# Patient Record
Sex: Female | Born: 1960
Health system: Southern US, Community
[De-identification: ages and names within clinical notes are randomized; demographics above are authoritative.]

## PROBLEM LIST (undated history)

## (undated) DIAGNOSIS — K449 Diaphragmatic hernia without obstruction or gangrene: Secondary | ICD-10-CM

## (undated) DIAGNOSIS — C439 Malignant melanoma of skin, unspecified: Secondary | ICD-10-CM

## (undated) DIAGNOSIS — J45909 Unspecified asthma, uncomplicated: Secondary | ICD-10-CM

## (undated) DIAGNOSIS — M797 Fibromyalgia: Secondary | ICD-10-CM

## (undated) DIAGNOSIS — F32A Depression, unspecified: Secondary | ICD-10-CM

## (undated) DIAGNOSIS — K649 Unspecified hemorrhoids: Secondary | ICD-10-CM

## (undated) DIAGNOSIS — F329 Major depressive disorder, single episode, unspecified: Secondary | ICD-10-CM

## (undated) DIAGNOSIS — K219 Gastro-esophageal reflux disease without esophagitis: Secondary | ICD-10-CM

## (undated) DIAGNOSIS — E785 Hyperlipidemia, unspecified: Secondary | ICD-10-CM

## (undated) DIAGNOSIS — K5090 Crohn's disease, unspecified, without complications: Secondary | ICD-10-CM

## (undated) DIAGNOSIS — R918 Other nonspecific abnormal finding of lung field: Secondary | ICD-10-CM

## (undated) HISTORY — DX: Gastro-esophageal reflux disease without esophagitis: K21.9

## (undated) HISTORY — DX: Diaphragmatic hernia without obstruction or gangrene: K44.9

## (undated) HISTORY — PX: MELANOMA EXCISION: SHX5266

## (undated) HISTORY — DX: Unspecified hemorrhoids: K64.9

## (undated) HISTORY — DX: Depression, unspecified: F32.A

## (undated) HISTORY — DX: Malignant melanoma of skin, unspecified: C43.9

## (undated) HISTORY — PX: OTHER SURGICAL HISTORY: SHX169

## (undated) HISTORY — DX: Hyperlipidemia, unspecified: E78.5

## (undated) HISTORY — DX: Other nonspecific abnormal finding of lung field: R91.8

## (undated) HISTORY — DX: Major depressive disorder, single episode, unspecified: F32.9

## (undated) HISTORY — DX: Fibromyalgia: M79.7

## (undated) HISTORY — DX: Crohn's disease, unspecified, without complications: K50.90

---

## 1998-05-25 ENCOUNTER — Ambulatory Visit (HOSPITAL_COMMUNITY): Admission: RE | Admit: 1998-05-25 | Discharge: 1998-05-25 | Payer: Self-pay | Admitting: Obstetrics and Gynecology

## 2000-05-17 ENCOUNTER — Ambulatory Visit: Admission: RE | Admit: 2000-05-17 | Discharge: 2000-05-17 | Payer: Self-pay | Admitting: Obstetrics and Gynecology

## 2000-05-21 ENCOUNTER — Inpatient Hospital Stay (HOSPITAL_COMMUNITY): Admission: AD | Admit: 2000-05-21 | Discharge: 2000-05-23 | Payer: Self-pay | Admitting: Obstetrics and Gynecology

## 2000-05-30 ENCOUNTER — Inpatient Hospital Stay (HOSPITAL_COMMUNITY): Admission: AD | Admit: 2000-05-30 | Discharge: 2000-05-30 | Payer: Self-pay | Admitting: Obstetrics and Gynecology

## 2000-07-05 ENCOUNTER — Other Ambulatory Visit: Admission: RE | Admit: 2000-07-05 | Discharge: 2000-07-05 | Payer: Self-pay | Admitting: Obstetrics and Gynecology

## 2001-01-16 ENCOUNTER — Encounter (INDEPENDENT_AMBULATORY_CARE_PROVIDER_SITE_OTHER): Payer: Self-pay | Admitting: Specialist

## 2001-01-16 ENCOUNTER — Ambulatory Visit (HOSPITAL_COMMUNITY): Admission: RE | Admit: 2001-01-16 | Discharge: 2001-01-16 | Payer: Self-pay | Admitting: Gastroenterology

## 2001-07-24 ENCOUNTER — Other Ambulatory Visit: Admission: RE | Admit: 2001-07-24 | Discharge: 2001-07-24 | Payer: Self-pay | Admitting: Obstetrics and Gynecology

## 2001-09-15 ENCOUNTER — Encounter: Payer: Self-pay | Admitting: Family Medicine

## 2001-09-15 ENCOUNTER — Encounter: Admission: RE | Admit: 2001-09-15 | Discharge: 2001-09-15 | Payer: Self-pay | Admitting: Family Medicine

## 2002-08-29 ENCOUNTER — Other Ambulatory Visit: Admission: RE | Admit: 2002-08-29 | Discharge: 2002-08-29 | Payer: Self-pay | Admitting: Obstetrics and Gynecology

## 2003-02-28 ENCOUNTER — Ambulatory Visit (HOSPITAL_COMMUNITY): Admission: RE | Admit: 2003-02-28 | Discharge: 2003-02-28 | Payer: Self-pay | Admitting: Gastroenterology

## 2003-02-28 ENCOUNTER — Encounter (INDEPENDENT_AMBULATORY_CARE_PROVIDER_SITE_OTHER): Payer: Self-pay | Admitting: *Deleted

## 2003-09-19 ENCOUNTER — Other Ambulatory Visit: Admission: RE | Admit: 2003-09-19 | Discharge: 2003-09-19 | Payer: Self-pay | Admitting: Obstetrics and Gynecology

## 2004-04-29 ENCOUNTER — Encounter: Admission: RE | Admit: 2004-04-29 | Discharge: 2004-04-29 | Payer: Self-pay | Admitting: Gastroenterology

## 2004-10-30 ENCOUNTER — Other Ambulatory Visit: Admission: RE | Admit: 2004-10-30 | Discharge: 2004-10-30 | Payer: Self-pay | Admitting: Obstetrics and Gynecology

## 2005-08-17 ENCOUNTER — Encounter: Admission: RE | Admit: 2005-08-17 | Discharge: 2005-08-17 | Payer: Self-pay | Admitting: Gastroenterology

## 2005-11-18 ENCOUNTER — Other Ambulatory Visit: Admission: RE | Admit: 2005-11-18 | Discharge: 2005-11-18 | Payer: Self-pay | Admitting: Obstetrics and Gynecology

## 2006-04-18 ENCOUNTER — Encounter: Admission: RE | Admit: 2006-04-18 | Discharge: 2006-04-18 | Payer: Self-pay | Admitting: Family Medicine

## 2006-05-06 ENCOUNTER — Encounter: Admission: RE | Admit: 2006-05-06 | Discharge: 2006-05-06 | Payer: Self-pay | Admitting: Neurology

## 2007-01-30 ENCOUNTER — Encounter: Admission: RE | Admit: 2007-01-30 | Discharge: 2007-01-30 | Payer: Self-pay | Admitting: Gastroenterology

## 2010-10-26 ENCOUNTER — Other Ambulatory Visit: Payer: Self-pay | Admitting: Gastroenterology

## 2011-01-29 NOTE — Op Note (Signed)
[name], Jessica Bates                          ACCOUNT NO.:  [id number]   MEDICAL RECORD NO.:  [id number]                   PATIENT TYPE:  AMB   LOCATION:  ENDO                                 FACILITY:  [facility]   PHYSICIAN:  Jeryl Columbia, M.D.                 DATE OF BIRTH:  [birth year]   DATE OF PROCEDURE:  02/28/2003  DATE OF DISCHARGE:                                 OPERATIVE REPORT   PROCEDURE:  Colonoscopy with biopsy.   INDICATION:  Chron's disease, want to reevaluate.  Consent was signed after  risks, benefits, methods, options thoroughly discussed in the office.   MEDICINES USED:  1. Demerol 70.  2. Versed 7.   DESCRIPTION OF PROCEDURE:  Rectal inspection was pertinent for external  hemorrhoids, small.  Digital exam was negative.  The pediatric video  adjustable colonoscope was inserted and with some difficulty due to a  tortuous colon, was able to be advanced to the level of the ileocecal valve.  On insertion, it looked like she had some stricturing and scarring and  pseudopolyps but no obvious active disease.  Despite rolling her in all  three positions and various abdominal pressures, we could not advance to the  cecal pole, and unfortunately it kept falling back, it could not advance  into the terminal ileum, so we elected to withdraw.  The prep was adequate.  There was minimal liquid stool that required washing and suctioning.  The  scope was slowly withdrawn.  The pseudopolyps and scarring were seen in both  flexures and then with some in the descending and sigmoid, and some  scattered biopsies of some of the pseudopolyps were obtained.  But again, no  inflammatory changes or ulcers or erythema was seen as we slowly withdrew  back to the rectum.  Once back in the rectum, the scope was retroflexed,  pertinent for some internal hemorrhoids.  The scope was straightened and  readvanced a short ways up the left side of the colon; air was suctioned,  scope removed.  The  patient tolerated the procedure well.  There was no  obvious immediate complication.   ENDOSCOPIC DIAGNOSES:  1. Internal-external hemorrhoids.  2. Pseudopolyps in the sigmoid, descending, transverse, and flexures, status     post biopsy.  3. Very tortuous colon.  4. Otherwise within normal limits to the level of the ileocecal valve,     unable to advance to the cecal pole or terminal ileum without any ulcers     or inflammation being seen.    PLAN:  1. Await pathology but consider holding her Remicade and see how she does     clinically.  2. Continuing 5-ASA products.  Jeryl Columbia, M.D.    MEM/MEDQ  D:  02/28/2003  T:  02/28/2003  Job:  [id number]   cc:   Lennette Bihari L. Little, M.D.  [address] [place]  [place] [address]  Fax: [phone]   Michael Litter, M.D.  [address]  [place] [address]  Fax: [phone]

## 2011-01-29 NOTE — Procedures (Signed)
[place]  Patient:    Jessica Bates, Jessica Bates                       MRN: [id number] Proc. Date: 01/16/01 Adm. Date:  [id number] Attending:  Orvis Brill CC:         Denita Lung, M.D.  Roswell Miners, M.D.   Procedure Report  PROCEDURE:  Colonoscopy with biopsy.  INDICATION:  Patient with mild Crohns disease based on her symptoms but some arthritis and arthralgias.  Want to re-evaluate her Crohns disease.  Consent was signed after risks, benefits, methods, and options thoroughly discussed in the office.  MEDICATIONS:  Demerol 50 mg, Versed 5 mg.  DESCRIPTION OF PROCEDURE:  Rectal inspection was pertinent for external hemorrhoids.  Digital exam was negative.  The video pediatric colonoscope was inserted with mild difficulty due to a tortuous colon with some looping.  We were able to advance to the cecum.  This did require rolling her on her back and various abdominal pressures.  On insertion, the rectum and the distal sigmoid seemed to be spared, but mostly in the descending in the transverse were patchy ulcers and inflammation and some pseudopolyps.  The cecum seemed to be spared as well as most of the ascending.  We did try multiple times to advanced into the terminal ileum.  We did peek inside the TI twice but could not maintain position.  No gross obvious abnormalities were seen in the terminal ileum on very quick evaluation.  The cecum was identified by the the ileocecal valve and the appendiceal orifice.  The prep was adequate.  There was some liquid stool throughout the colon, which required washing and suctioning, but on slow withdrawal through the colon as mentioned above, the cecum and the majority of the ascending seemed to be normal.  There were patchy ulcers throughout the transverse and the majority of the descending. Occasional biopsies were obtained.  There were also some pseudopolyps and occasional biopsies of them were  obtained as well.  We withdrew back to 40 cm, approximately in the sigmoid-descending junction.  We went ahead and switched containers, and a few of the distal ulcers and pseudopolyps were biopsied and put in a second container.  The distal sigmoid was normal, as was the rectum. The scope was retroflexed, pertinent for some internal hemorrhoids, small. The scope was straightened, air was suctioned, and scope removed.  The patient tolerated the procedure well.  There was no obvious immediate complication.  ENDOSCOPIC DIAGNOSES: 1. Small internal-external hemorrhoids. 2. Rectal distal sigmoid and cecum and part of the ascending being spared. 3. Patchy multiple ulcers and pseudopolyps with multiple left and right    biopsies, separated from 40 cm, with the second bottle being the more    distal biopsies. 4. Otherwise within normal limits with very quick look to the terminal ileum.  PLAN:  Await pathology and then discuss case with Dr. Justine Null to discuss 6-MP, Imuran, or Remicade or any other medicine options, and have her call me sooner p.r.n. DD:  01/16/01 TD:  01/17/01 Job: [id number]

## 2012-03-06 ENCOUNTER — Other Ambulatory Visit: Payer: Self-pay | Admitting: Obstetrics and Gynecology

## 2012-03-06 DIAGNOSIS — R928 Other abnormal and inconclusive findings on diagnostic imaging of breast: Secondary | ICD-10-CM

## 2012-04-03 ENCOUNTER — Ambulatory Visit
Admission: RE | Admit: 2012-04-03 | Discharge: 2012-04-03 | Disposition: A | Discharge status: Home / Self Care | Payer: BC Managed Care – PPO | Source: Ambulatory Visit | Attending: Obstetrics and Gynecology | Admitting: Obstetrics and Gynecology

## 2012-04-03 ENCOUNTER — Other Ambulatory Visit: Payer: Self-pay

## 2012-04-03 DIAGNOSIS — R928 Other abnormal and inconclusive findings on diagnostic imaging of breast: Secondary | ICD-10-CM

## 2012-09-04 ENCOUNTER — Other Ambulatory Visit: Payer: Self-pay | Admitting: Obstetrics and Gynecology

## 2012-09-04 DIAGNOSIS — N644 Mastodynia: Secondary | ICD-10-CM

## 2012-09-14 ENCOUNTER — Ambulatory Visit
Admission: RE | Admit: 2012-09-14 | Discharge: 2012-09-14 | Disposition: A | Discharge status: Home / Self Care | Payer: BC Managed Care – PPO | Source: Ambulatory Visit | Attending: Obstetrics and Gynecology | Admitting: Obstetrics and Gynecology

## 2012-09-14 DIAGNOSIS — N644 Mastodynia: Secondary | ICD-10-CM

## 2012-09-15 ENCOUNTER — Other Ambulatory Visit: Payer: BC Managed Care – PPO

## 2012-12-22 ENCOUNTER — Other Ambulatory Visit: Payer: Self-pay | Admitting: Gastroenterology

## 2012-12-22 DIAGNOSIS — R109 Unspecified abdominal pain: Secondary | ICD-10-CM

## 2012-12-25 ENCOUNTER — Ambulatory Visit
Admission: RE | Admit: 2012-12-25 | Discharge: 2012-12-25 | Disposition: A | Discharge status: Home / Self Care | Payer: BC Managed Care – PPO | Source: Ambulatory Visit | Attending: Gastroenterology | Admitting: Gastroenterology

## 2012-12-25 DIAGNOSIS — R109 Unspecified abdominal pain: Secondary | ICD-10-CM

## 2012-12-25 MED ORDER — IOHEXOL 300 MG/ML  SOLN
100.0000 mL | Freq: Once | INTRAMUSCULAR | Status: AC | PRN
  Administered 2012-12-25: 100 mL via INTRAVENOUS

## 2013-05-22 ENCOUNTER — Other Ambulatory Visit: Payer: Self-pay | Admitting: Obstetrics and Gynecology

## 2013-05-22 DIAGNOSIS — R928 Other abnormal and inconclusive findings on diagnostic imaging of breast: Secondary | ICD-10-CM

## 2013-06-07 ENCOUNTER — Ambulatory Visit
Admission: RE | Admit: 2013-06-07 | Discharge: 2013-06-07 | Disposition: A | Discharge status: Home / Self Care | Payer: BC Managed Care – PPO | Source: Ambulatory Visit | Attending: Obstetrics and Gynecology | Admitting: Obstetrics and Gynecology

## 2013-06-07 DIAGNOSIS — R928 Other abnormal and inconclusive findings on diagnostic imaging of breast: Secondary | ICD-10-CM

## 2014-03-11 ENCOUNTER — Other Ambulatory Visit: Payer: Self-pay | Admitting: Gastroenterology

## 2014-03-11 DIAGNOSIS — R109 Unspecified abdominal pain: Secondary | ICD-10-CM

## 2014-03-14 ENCOUNTER — Ambulatory Visit
Admission: RE | Admit: 2014-03-14 | Discharge: 2014-03-14 | Disposition: A | Discharge status: Home / Self Care | Payer: BC Managed Care – PPO | Source: Ambulatory Visit | Attending: Gastroenterology | Admitting: Gastroenterology

## 2014-03-14 DIAGNOSIS — R109 Unspecified abdominal pain: Secondary | ICD-10-CM

## 2014-03-14 MED ORDER — IOHEXOL 300 MG/ML  SOLN
100.0000 mL | Freq: Once | INTRAMUSCULAR | Status: AC | PRN
  Administered 2014-03-14: 100 mL via INTRAVENOUS

## 2014-08-12 ENCOUNTER — Other Ambulatory Visit: Payer: Self-pay | Admitting: Gastroenterology

## 2015-05-22 ENCOUNTER — Other Ambulatory Visit: Payer: Self-pay | Admitting: Obstetrics and Gynecology

## 2015-05-23 LAB — CYTOLOGY - PAP

## 2016-05-31 ENCOUNTER — Encounter: Payer: Self-pay | Admitting: Internal Medicine

## 2016-05-31 ENCOUNTER — Ambulatory Visit (INDEPENDENT_AMBULATORY_CARE_PROVIDER_SITE_OTHER): Payer: BLUE CROSS/BLUE SHIELD | Admitting: Internal Medicine

## 2016-05-31 DIAGNOSIS — R05 Cough: Secondary | ICD-10-CM | POA: Diagnosis not present

## 2016-05-31 DIAGNOSIS — R053 Chronic cough: Secondary | ICD-10-CM | POA: Insufficient documentation

## 2016-05-31 LAB — NITRIC OXIDE: Nitric Oxide: 16

## 2016-05-31 NOTE — Patient Instructions (Addendum)
It is nice to meet you today. We will do a FENO today in the office. We will give you a copy of the GERD diet. Continue your protonix treatment as Dr. Watt Climes prescribed. Sips of water instead of throat clearing. Sugar Free Jolly Ranchers for throat soothing. Follow Up with Dr. Chase Caller or NP in 6 -8 weeks. Please contact office for sooner follow up if symptoms do not improve or worsen or seek emergency care

## 2016-05-31 NOTE — Assessment & Plan Note (Addendum)
Chronic Cough Plan: We will do a FENO today in the office. We will give you a copy of the GERD diet. Continue your protonix treatment as Dr. Watt Climes prescribed. Sips of water instead of throat clearing. Sugar Free Jolly Ranchers for throat soothing. Follow Up with Dr. Chase Caller or NP in 6 -8 weeks. Please contact office for sooner follow up if symptoms do not improve or worsen or seek emergency care

## 2016-05-31 NOTE — Progress Notes (Signed)
Subjective:    Patient ID: Jessica Bates, female    DOB: 09-17-1960, 55 y.o.   MRN: [id number]   [place] Lehrmann is a 55 year old female former smoker referred by Dr. Hulan Fess for chronic cough here to see Dr. Brand Males.  HPI   Pt. Presents to the office as a referral from Dr. Hulan Fess for chronic cough/ asthma breathing issues. She states that her cough started 01/2016, and that it is non-productive. It started as a cough while lying down,or throat clearing when standing or sitting. She has also had some post nasal drip, but no sinus congestion. She is taking Zyrtec daily. She did go and see Dr. Watt Climes, and was started on the Prilosec and she is having an endoscopy done 06/21/2016.She has no wheezing or history of asthma.She also states she has some shortness of breath with activity, specifically climbing stairs. She also states it is hard to get a deep breath. She takes Humira for Chrohn's disease. She has been taking this medication for 18 months.She  states she  does have some pain in her right scapular area  what is worse on inspiration and worse after a large meal. She denies fever, chest pain, orthopnea or hemoptysis. 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    Current Outpatient Prescriptions:  .  Adalimumab (HUMIRA) 40 MG/0.8ML PSKT, Inject into the skin. Every two weeks, Disp: , Rfl:  .  atorvastatin (LIPITOR) 40 MG tablet, Take 40 mg by mouth daily., Disp: , Rfl:  .  cetirizine (ZYRTEC) 10 MG tablet, Take 10 mg by mouth daily., Disp: , Rfl:  .  norgestrel-ethinyl estradiol (LO/OVRAL,CRYSELLE) 0.3-30 MG-MCG tablet, Take 1 tablet by mouth daily., Disp: , Rfl:  .  omeprazole (PRILOSEC) 40 MG capsule, Take 40 mg by mouth 2 (two) times daily., Disp: , Rfl:  .  zolpidem (AMBIEN) 10 MG tablet, Take 10 mg by mouth at bedtime as needed for sleep., Disp: , Rfl:    Past Surgical History:  Procedure Laterality Date  . MELANOMA  EXCISION      Past Medical History:  Diagnosis Date  . Crohn disease ([place])   . Depression meopause  . Dyslipidemia   . Fibromyalgia   . Hemorrhoids   . Melanoma [facility])    Social History   Social History  . Marital status: Single    Spouse name: N/A  . Number of children: N/A  . Years of education: N/A   Social History Main Topics  . Smoking status: Former Smoker    Packs/day: 0.25    Years: 15.00    Types: Cigarettes    Quit date: 09/13/1998  . Smokeless tobacco: Never Used  . Alcohol use Yes     Comment: occassional  . Drug use: No  . Sexual activity: Not Asked   Other Topics Concern  . None   Social History Narrative   Assistant [employer]        Family History  Problem Relation Age of Onset  . Leukemia Father    Review of Systems  Constitutional: Negative for fever and unexpected weight change.  HENT: Negative for congestion, dental problem, ear pain, nosebleeds, postnasal drip, rhinorrhea, sinus pressure, sneezing, sore throat and trouble swallowing.   Eyes: Negative for redness and itching.  Respiratory: Positive for cough. Negative for chest tightness, shortness of breath and wheezing.   Cardiovascular: Negative for palpitations and leg swelling.  Gastrointestinal: Negative for nausea and vomiting.  Genitourinary: Negative for dysuria.  Musculoskeletal: Negative for joint swelling.  Skin: Negative for rash.  Neurological: Negative for headaches.  Hematological: Does not bruise/bleed easily.  Psychiatric/Behavioral: Negative for dysphoric mood. The patient is not nervous/anxious.    Tests:  FENO 05/31/2016>> 16 CXR 05/03/2016>> No Active cardio-pulmonary process.    Objective:   Physical Exam  General- No distress,  A&Ox3, pleasant ENT: No sinus tenderness, TM clear, pale nasal mucosa, no oral exudate,no post nasal drip, no LAN Cardiac: S1, S2, regular rate and rhythm, no murmur Chest: No wheeze/ rales/ dullness; no accessory  muscle use, no nasal flaring, no sternal retractions Abd.: Soft Non-tender Ext: No clubbing cyanosis, edema Neuro:  normal strength Skin: No rashes, warm and dry Psych: normal mood and behavior  BP 120/68 (BP Location: Left Arm, Cuff Size: Normal)   Pulse 69   Ht 5' 6"  (1.676 m)   Wt 132 lb 6.4 oz (60.1 kg)   SpO2 99%   BMI 21.37 kg/m           Assessment & Plan:  Chronic cough improving with PPI treatment per GI CXR clear ( Immunosuppressed on Humira) Very Low suspicion for opportunistic infection  Plan: We will do a FENO today in the office. We will give you a copy of the GERD diet. Continue your protonix treatment as Dr. Watt Climes prescribed. Sips of water instead of throat clearing. Sugar Free Jolly Ranchers for throat soothing. Follow Up with Dr. Chase Caller or NP in 6 -8 weeks. Please contact office for sooner follow up if symptoms do not improve or worsen or seek emergency care     STAFF NOTE: I, Dr Ann Lions have personally reviewed patient's available data, including medical history, events of note, physical examination and test results as part of my evaluation. I have discussed with resident/NP and other care providers such as pharmacist, RN and RRT.  In addition,  I personally evaluated patient and elicited key findings of   S: nerw consult for chronic cough since may 2017. No preceding infectious illness. Lot of throat clearing. Cough improved significantly since going on PPI. Alsp has exertional dyspnea and feeling to take deep breath since summer 2017. Has EGD pending. Feno test normal. CXR with PCP aug 2017 clear. No fever . No sputum  Pmhx: on TNF-alpha blockade  O: Looks well Occ clearing of throat No crack;es on resp exam CTA bilaterally    A: chronic cough - history fits in with gerd mediated irritable larynx. A lot better Dyspnea - ? Cause but improving  No evidence of asthma  At risk for opportunistic infection and with symptioms but risk low  given improvement and clear cxr  P: walk test on RA - she prefers to avoid it today and followup She prefers to keep an eye on cough; and return in 4-8 weeks if still problematic + dependent on EGD results - this is very acceptable plan    .  Rest per NP/medical resident whose note is outlined above and that I agree with     Dr. Brand Males, M.D., [facility].C.P Pulmonary and Critical Care Medicine Staff Physician [place] [facility] Pager: [phone], If no answer or between  15:00h - 7:00h: call [phone]  05/31/2016 4:10 PM

## 2016-07-13 ENCOUNTER — Ambulatory Visit: Payer: BLUE CROSS/BLUE SHIELD | Admitting: Internal Medicine

## 2016-11-07 ENCOUNTER — Emergency Department (HOSPITAL_COMMUNITY): Payer: BLUE CROSS/BLUE SHIELD

## 2016-11-07 ENCOUNTER — Emergency Department (HOSPITAL_COMMUNITY)
Admission: EM | Admit: 2016-11-07 | Discharge: 2016-11-07 | Disposition: A | Discharge status: Home / Self Care | Payer: BLUE CROSS/BLUE SHIELD | Attending: Emergency Medicine | Admitting: Emergency Medicine

## 2016-11-07 ENCOUNTER — Encounter (HOSPITAL_COMMUNITY): Payer: Self-pay | Admitting: Emergency Medicine

## 2016-11-07 DIAGNOSIS — Z79899 Other long term (current) drug therapy: Secondary | ICD-10-CM | POA: Diagnosis not present

## 2016-11-07 DIAGNOSIS — R079 Chest pain, unspecified: Secondary | ICD-10-CM | POA: Diagnosis present

## 2016-11-07 DIAGNOSIS — Z87891 Personal history of nicotine dependence: Secondary | ICD-10-CM | POA: Insufficient documentation

## 2016-11-07 DIAGNOSIS — Z85820 Personal history of malignant melanoma of skin: Secondary | ICD-10-CM | POA: Diagnosis not present

## 2016-11-07 DIAGNOSIS — J189 Pneumonia, unspecified organism: Secondary | ICD-10-CM | POA: Insufficient documentation

## 2016-11-07 HISTORY — DX: Unspecified asthma, uncomplicated: J45.909

## 2016-11-07 LAB — I-STAT CG4 LACTIC ACID, ED: Lactic Acid, Venous: 0.59 mmol/L (ref 0.5–1.9)

## 2016-11-07 LAB — BASIC METABOLIC PANEL
Anion gap: 10 (ref 5–15)
BUN: 7 mg/dL (ref 6–20)
CO2: 22 mmol/L (ref 22–32)
Calcium: 9 mg/dL (ref 8.9–10.3)
Chloride: 105 mmol/L (ref 101–111)
Creatinine, Ser: 0.7 mg/dL (ref 0.44–1.00)
GFR calc Af Amer: >60 mL/min (ref >60)
GFR calc non Af Amer: >60 mL/min (ref >60)
Glucose, Bld: 100 mg/dL — ABNORMAL HIGH (ref 65–99)
Potassium: 3.7 mmol/L (ref 3.5–5.1)
Sodium: 137 mmol/L (ref 135–145)

## 2016-11-07 LAB — CBC WITH DIFFERENTIAL/PLATELET
Basophils Absolute: 0 10*3/uL (ref 0.0–0.1)
Basophils Relative: 0 %
Eosinophils Absolute: 0 10*3/uL (ref 0.0–0.7)
Eosinophils Relative: 0 %
HCT: 36.1 % (ref 36.0–46.0)
Hemoglobin: 11.7 g/dL — ABNORMAL LOW (ref 12.0–15.0)
Lymphocytes Relative: 19 %
Lymphs Abs: 2.5 10*3/uL (ref 0.7–4.0)
MCH: 30.6 pg (ref 26.0–34.0)
MCHC: 32.4 g/dL (ref 30.0–36.0)
MCV: 94.5 fL (ref 78.0–100.0)
Monocytes Absolute: 0.9 10*3/uL (ref 0.1–1.0)
Monocytes Relative: 7 %
Neutro Abs: 9.7 10*3/uL — ABNORMAL HIGH (ref 1.7–7.7)
Neutrophils Relative %: 74 %
Platelets: 425 10*3/uL — ABNORMAL HIGH (ref 150–400)
RBC: 3.82 MIL/uL — ABNORMAL LOW (ref 3.87–5.11)
RDW: 12.2 % (ref 11.5–15.5)
WBC: 13.3 10*3/uL — ABNORMAL HIGH (ref 4.0–10.5)

## 2016-11-07 MED ORDER — IOPAMIDOL (ISOVUE-370) INJECTION 76%
INTRAVENOUS | Status: AC
  Administered 2016-11-07: 100 mL
  Filled 2016-11-07: qty 100

## 2016-11-07 MED ORDER — CEFTRIAXONE SODIUM 1 G IJ SOLR
1.0000 g | Freq: Once | INTRAMUSCULAR | Status: AC
  Administered 2016-11-07: 1 g via INTRAVENOUS
  Filled 2016-11-07: qty 10

## 2016-11-07 MED ORDER — SODIUM CHLORIDE 0.9 % IV BOLUS (SEPSIS)
500.0000 mL | Freq: Once | INTRAVENOUS | Status: AC
  Administered 2016-11-07: 500 mL via INTRAVENOUS

## 2016-11-07 MED ORDER — AZITHROMYCIN 250 MG PO TABS
500.0000 mg | ORAL_TABLET | Freq: Once | ORAL | Status: AC
  Administered 2016-11-07: 500 mg via ORAL
  Filled 2016-11-07: qty 2

## 2016-11-07 MED ORDER — CIPROFLOXACIN HCL 500 MG PO TABS: 500.0000 mg | ORAL_TABLET | Freq: Two times a day (BID) | ORAL | 0 refills | Status: DC

## 2016-11-07 MED ORDER — BENZONATATE 100 MG PO CAPS: 100.0000 mg | ORAL_CAPSULE | Freq: Three times a day (TID) | ORAL | 0 refills | Status: DC

## 2016-11-07 NOTE — Discharge Instructions (Signed)
If you were given medicines take as directed.  If you are on coumadin or contraceptives realize their levels and effectiveness is altered by many different medicines.  If you have any reaction (rash, tongues swelling, other) to the medicines stop taking and see a physician.    If your blood pressure was elevated in the ER make sure you follow up for management with a primary doctor or return for chest pain, shortness of breath or stroke symptoms.  Please follow up as directed and return to the ER or see a physician for new or worsening symptoms.  Thank you. Vitals:   11/07/16 1151 11/07/16 1430 11/07/16 1500 11/07/16 1530  BP: 111/68 129/77 131/76 133/74  Pulse: 77 77 88 74  Resp: 17     Temp: 98.7 F (37.1 C)     TempSrc: Oral     SpO2: 100% 100% 92% 100%  Weight: 129 lb (58.5 kg)     Height: 5' 6"  (1.676 m)

## 2016-11-07 NOTE — ED Triage Notes (Signed)
Pt. Stated, I had flu like symptoms all last week with left shoulder pain and Ive had rt. Rib pain under my arm.  I have just not felt good. I went to doctor this morning , and  Said I have pneumonia on both sides, and said I need to come here.

## 2016-11-07 NOTE — ED Notes (Signed)
Patient requesting prescription for cough-related discomfort.

## 2016-11-07 NOTE — ED Notes (Addendum)
Patient states she was referred to ED by [facility] physicians after a CXR this morning showing BIL pneumonia.  Patient ambulatory to restroom independently without difficulty.

## 2016-11-07 NOTE — ED Provider Notes (Signed)
Patient's care signed out to follow-up CT scan send home with oral abx with outpatient follow up.   Ct Angio Chest Pe W Or Wo Contrast  Result Date: 11/07/2016 CLINICAL DATA:  Cough and chest pain since last night. Pneumonia based on chest films earlier today. EXAM: CT ANGIOGRAPHY CHEST WITH CONTRAST TECHNIQUE: Multidetector CT imaging of the chest was performed using the standard protocol during bolus administration of intravenous contrast. Multiplanar CT image reconstructions and MIPs were obtained to evaluate the vascular anatomy. CONTRAST:  80 cc Isovue 370. COMPARISON:  None. FINDINGS: Cardiovascular: No pulmonary embolus is identified. No pericardial effusion. Heart size is mildly enlarged. Mediastinum/Nodes: Right hilar lymph node on image 54 measures 1.3 cm in diameter and is likely reactive. A few additional small mediastinal lymph nodes are noted. No axillary adenopathy. Thyroid gland appears normal. No hiatal hernia. Lungs/Pleura: Small pleural effusion is identified. The patient has airspace disease in both the right middle lobe and lingula with an appearance most consistent with pneumonia. Mild patchy airspace opacity is also seen in the superior segments of the lower lobes bilaterally, worse on the right. Upper Abdomen: No acute abnormality. Musculoskeletal: Bilateral breast implants are noted. No focal bony abnormality. Review of the MIP images confirms the above findings. IMPRESSION: Negative for pulmonary embolus. Multifocal bilateral airspace disease as described above and small pleural effusions consistent with pneumonia. Airspace disease is worst in the lingula and right middle lobe. Electronically Signed   By: Inge Rise M.D.   On: 11/07/2016 16:28  Ravindra Baranek, Paula Compton, MD 11/07/16 [phone]

## 2016-11-07 NOTE — ED Provider Notes (Signed)
[place] DEPT Provider Note   CSN: [id number] Arrival date & time: 11/07/16  1141     History   Chief Complaint Chief Complaint  Patient presents with  . Shoulder Pain  . Chest Pain    right rib cage.    HPI Jessica Bates is a 56 y.o. female.  Patient with history of Crohn's disease on Humira, estrogen use -- presents with complaint of flulike symptoms for the past week. Over the past several days patient developed pain in her left shoulder and right lateral ribs. She has had persistent cough. She has had intermittent fevers and chills, night sweats. Patient went to [facility] primary care today and had a chest x-ray showing bilateral pneumonia. She was sent to the emergency department for further evaluation. No hemoptysis. No vomiting or diarrhea. No lightheadedness or syncope. No lower extremity swelling or history of heart failure. The onset of this condition was acute. The course is constant. Aggravating factors: none. Alleviating factors: none.        Past Medical History:  Diagnosis Date  . Crohn disease ([place])   . Depression meopause  . Dyslipidemia   . Fibromyalgia   . Hemorrhoids   . Melanoma [facility])     Patient Active Problem List   Diagnosis Date Noted  . Chronic cough 05/31/2016    Past Surgical History:  Procedure Laterality Date  . MELANOMA EXCISION      OB History    No data available       Home Medications    Prior to Admission medications   Medication Sig Start Date End Date Taking? Authorizing Provider  atorvastatin (LIPITOR) 40 MG tablet Take 40 mg by mouth daily.   Yes Historical Provider, MD  cetirizine (ZYRTEC) 10 MG tablet Take 10 mg by mouth daily.   Yes Historical Provider, MD  estradiol (VIVELLE-DOT) 0.075 MG/24HR Place 1 patch onto the skin See admin instructions. Sunday Tuesday 10/22/16  Yes Historical Provider, MD  norgestrel-ethinyl estradiol (LO/OVRAL,CRYSELLE) 0.3-30 MG-MCG tablet Take 1 tablet by mouth daily.   Yes Historical  Provider, MD  omeprazole (PRILOSEC) 40 MG capsule Take 40 mg by mouth 2 (two) times daily.   Yes Historical Provider, MD  zolpidem (AMBIEN) 10 MG tablet Take 10 mg by mouth at bedtime as needed for sleep.   Yes Historical Provider, MD  Adalimumab (HUMIRA) 40 MG/0.8ML PSKT Inject into the skin. Every two weeks    Historical Provider, MD    Family History Family History  Problem Relation Age of Onset  . Leukemia Father     Social History Social History  Substance Use Topics  . Smoking status: Former Smoker    Packs/day: 0.25    Years: 15.00    Types: Cigarettes    Quit date: 09/13/1998  . Smokeless tobacco: Never Used  . Alcohol use Yes     Comment: occassional     Allergies   Cymbalta [duloxetine hcl]; Penicillins; and Remicade [infliximab]   Review of Systems Review of Systems  Constitutional: Positive for chills, fatigue and fever.  HENT: Negative for rhinorrhea and sore throat.   Eyes: Negative for redness.  Respiratory: Negative for cough and wheezing.   Cardiovascular: Positive for chest pain.  Gastrointestinal: Negative for abdominal pain, diarrhea, nausea and vomiting.  Genitourinary: Negative for dysuria.  Musculoskeletal: Negative for myalgias.  Skin: Negative for rash.  Neurological: Negative for headaches.     Physical Exam Updated Vital Signs BP 111/68 (BP Location: Left Arm)   Pulse 77  Temp 98.7 F (37.1 C) (Oral)   Resp 17   Ht 5' 6"  (1.676 m)   Wt 58.5 kg   LMP 02/14/2014 Comment: bcp//A.C.  SpO2 100%   BMI 20.82 kg/m   Physical Exam  Constitutional: She appears well-developed and well-nourished.  HENT:  Head: Normocephalic and atraumatic.  Right Ear: Tympanic membrane, external ear and ear canal normal.  Left Ear: Tympanic membrane, external ear and ear canal normal.  Nose: Nose normal. No mucosal edema or rhinorrhea.  Mouth/Throat: Uvula is midline, oropharynx is clear and moist and mucous membranes are normal. Mucous membranes are not  dry. No oral lesions. No trismus in the jaw. No uvula swelling. No oropharyngeal exudate, posterior oropharyngeal edema, posterior oropharyngeal erythema or tonsillar abscesses.  Eyes: Conjunctivae are normal. Right eye exhibits no discharge. Left eye exhibits no discharge.  Neck: Normal range of motion. Neck supple.  Cardiovascular: Normal rate, regular rhythm and normal heart sounds.   No murmur heard. Pulmonary/Chest: Effort normal. No respiratory distress. She has no wheezes. She has rales (Bilateral bases). She exhibits tenderness.  Abdominal: Soft. She exhibits no mass. There is no tenderness. There is no guarding.  Lymphadenopathy:    She has no cervical adenopathy.  Neurological: She is alert.  Skin: Skin is warm and dry.  Psychiatric: She has a normal mood and affect.  Nursing note and vitals reviewed.    ED Treatments / Results  Labs (all labs ordered are listed, but only abnormal results are displayed) Labs Reviewed  BASIC METABOLIC PANEL - Abnormal; Notable for the following:       Result Value   Glucose, Bld 100 (*)    All other components within normal limits  CBC WITH DIFFERENTIAL/PLATELET - Abnormal; Notable for the following:    WBC 13.3 (*)    RBC 3.82 (*)    Hemoglobin 11.7 (*)    Platelets 425 (*)    Neutro Abs 9.7 (*)    All other components within normal limits  I-STAT CG4 LACTIC ACID, ED    EKG  EKG Interpretation None       Radiology No results found.  Procedures Procedures (including critical care time)  Medications Ordered in ED Medications  cefTRIAXone (ROCEPHIN) 1 g in dextrose 5 % 50 mL IVPB (not administered)  azithromycin (ZITHROMAX) tablet 500 mg (not administered)  sodium chloride 0.9 % bolus 500 mL (not administered)     Initial Impression / Assessment and Plan / ED Course  I have reviewed the triage vital signs and the nursing notes.  Pertinent labs & imaging results that were available during my care of the patient were  reviewed by me and considered in my medical decision making (see chart for details).     Patient seen and examined. Work-up initiated. Medications ordered.   Vital signs reviewed and are as follows: BP 111/68 (BP Location: Left Arm)   Pulse 77   Temp 98.7 F (37.1 C) (Oral)   Resp 17   Ht 5' 6"  (1.676 m)   Wt 58.5 kg   LMP 02/14/2014 Comment: bcp//A.C.  SpO2 100%   BMI 20.82 kg/m   Patient noted to have elevated white blood cell count. I am unable to see her chest x-ray. I feel that given she is on immunosuppressive therapy and estrogen, chest CT to evaluate extent of pneumonia and to rule out pulmonary embolism is reasonable. Will also check lactate. Patient and family in agreement with plan.   4:22 PM Handoff to Dr. Reather Converse  at shift change, pending CT results. Lactate normal.   Final Clinical Impressions(s) / ED Diagnoses   Final diagnoses:  Community acquired pneumonia, unspecified laterality    New Prescriptions New Prescriptions   No medications on file     Carlisle Cater, PA-C 11/07/16 1623    Leo Grosser, MD 11/08/16 1013

## 2017-02-11 ENCOUNTER — Other Ambulatory Visit: Payer: Self-pay | Admitting: Family Medicine

## 2017-02-11 ENCOUNTER — Telehealth: Payer: Self-pay | Admitting: Internal Medicine

## 2017-02-11 DIAGNOSIS — R9389 Abnormal findings on diagnostic imaging of other specified body structures: Secondary | ICD-10-CM

## 2017-02-11 NOTE — Telephone Encounter (Signed)
Patient returned call, CB is [phone]

## 2017-02-11 NOTE — Telephone Encounter (Signed)
lmtcb x1 for pt. 

## 2017-02-11 NOTE — Telephone Encounter (Signed)
lmtcb for pt.  

## 2017-02-11 NOTE — Telephone Encounter (Signed)
Pt states she seen Dr. Rex Kras one week ago, a CXR was performed. Pt states Dr. Rex Kras states CXR "shows something" and has ordered a CT for further evaluating. Pt states she feels that she has developed PNA. Pt reports of increased son, non prod cough & chest pressure x1.5w Pt is requesting an apt with MR next week.  MR please advise. Thanks.

## 2017-02-11 NOTE — Telephone Encounter (Signed)
I last saw sept 2017. There was pneumonia on CT chest 3 months ago feb 2018. She needs to now do  Another CT chest wo contrast and then come be seen. My slots full. Best she sees APP next week  Dr. Brand Males, M.D., [facility].C.P Pulmonary and Critical Care Medicine Staff Physician [place] [facility] Pager: [phone], If no answer or between  15:00h - 7:00h: call [phone]  02/11/2017 10:46 AM   \

## 2017-02-14 ENCOUNTER — Ambulatory Visit
Admission: RE | Admit: 2017-02-14 | Discharge: 2017-02-14 | Disposition: A | Discharge status: Home / Self Care | Payer: BLUE CROSS/BLUE SHIELD | Source: Ambulatory Visit | Attending: Family Medicine | Admitting: Family Medicine

## 2017-02-14 DIAGNOSIS — R9389 Abnormal findings on diagnostic imaging of other specified body structures: Secondary | ICD-10-CM

## 2017-02-14 MED ORDER — IOPAMIDOL (ISOVUE-300) INJECTION 61%
75.0000 mL | Freq: Once | INTRAVENOUS | Status: AC | PRN
  Administered 2017-02-14: 75 mL via INTRAVENOUS

## 2017-02-14 NOTE — Telephone Encounter (Signed)
Pt refused appt with APP Pt is having at CT chest WITH contrast today at 12pm - Dr Hulan Fess ordered this.  Pt is requesting to be worked in with Dr Chase Caller to discuss recurrent PNA or she states that she will just follow up with her PCP. Pt scheduled with MR 02/16/17 at 10:15. Will send to Dr Chase Caller as Juluis Rainier. Nothing further needed.

## 2017-02-14 NOTE — Telephone Encounter (Signed)
lmtcb for pt.  

## 2017-02-16 ENCOUNTER — Encounter: Payer: Self-pay | Admitting: Internal Medicine

## 2017-02-16 ENCOUNTER — Ambulatory Visit (INDEPENDENT_AMBULATORY_CARE_PROVIDER_SITE_OTHER): Payer: BLUE CROSS/BLUE SHIELD | Admitting: Internal Medicine

## 2017-02-16 VITALS — BP 112/68 | HR 72 | Ht 66.0 in | Wt 131.8 lb

## 2017-02-16 DIAGNOSIS — Z8701 Personal history of pneumonia (recurrent): Secondary | ICD-10-CM

## 2017-02-16 DIAGNOSIS — R0602 Shortness of breath: Secondary | ICD-10-CM

## 2017-02-16 DIAGNOSIS — R918 Other nonspecific abnormal finding of lung field: Secondary | ICD-10-CM

## 2017-02-16 NOTE — Patient Instructions (Signed)
Shortness of breath  Abnormal CT scan of lung  History of pneumonia  We will need to continue monitoring your symptoms Please have your CT scan of chest without contrast in 3 months and follow up with at that time.

## 2017-02-16 NOTE — Progress Notes (Signed)
Subjective:     Patient ID: Jessica Bates, female   DOB: [birth year], 56 y.o.   MRN: [id number]  HPI 56 year old woman, former smoker w/ Crohn disease, HLD presenting for follow up of chronic cough.   Her throat clearing is resolved. She made lifestyle changes to improve her GERD. She is only taking reflux medication as needed.   Had pneumonia in 11/07/2016. She feels that she is not back to baseline. During the pneumonia, she had deep pain with breathing. She was initially treated with ceftriaxone and azithromycin then she was prescribed ciprofloxacin. She did not improve so she was treated with Levaquin a few weeks later. She feels fatigue, dyspnea and chest pressure. The dyspnea and chest pressure is all the time. She has been able to take walks. Feels need to take deep breaths but does not feel that her breathing is labored. She had a sinus infection and the residual chest pressure and was given a course of Levaquin May 25th. She has completed that course. She had some chills at around the time she was given the second course.   FENO 05/31/2016: 16 CXR 05/03/2016: No active cardio-pulmonary process.  CTA chest 11/07/2016: no PE, multifocal bilateral airspace disease worse in lingula and RML, small pleural effusions CT chest w contrast 02/14/2017: mild residual density inferiorly in lingulair segment of LUL and anterior portion or RML, improved from prior exam, 37m subpleural nodule lateral RML  Review of Systems Constitutional: no fevers Eyes: no vision changes Ears, nose, mouth, throat, and face: +very little cough, non productive Respiratory: no shortness of breath Cardiovascular: +chest pressure Gastrointestinal: no nausea/vomiting, no abdominal pain, +chronic constipation, +chronic diarrhea Genitourinary: no dysuria, no hematuria Integument: no rash Hematologic/lymphatic: no bleeding/bruising, no edema Musculoskeletal: +occasional hip pain that is improving, no myalgias Neurological:  +ocasional paresthesias in hands/feet, no weakness     Objective:   Physical Exam BP 112/68 (BP Location: Right Arm, Cuff Size: Normal)   Pulse 72   Ht 5' 6"  (1.676 m)   Wt 131 lb 12.8 oz (59.8 kg)   LMP 02/14/2014 Comment: bcp//A.C.  SpO2 97%   BMI 21.27 kg/m  General Apperance: NAD HEENT: Normocephalic, atraumatic, anicteric sclera Neck: Supple, trachea midline Lungs: Clear to auscultation bilaterally. No wheezes, rhonchi or rales. Breathing comfortably Heart: Regular rate and rhythm, no murmur/rub/gallop Abdomen: Soft, nontender, nondistended, no rebound/guarding Extremities: Warm and well perfused, no edema Skin: No rashes or lesions Neurologic: Alert and interactive. No gross deficits.    Assessment:     Dyspnea - slow recovery from pneumonia probably in setting of immune suppression Abnormal CT chest - mild residual density inferiorly in lingulair segment of LUL and anterior portion or RML, improved from prior exam. She is at risk for developing residual scarring/bronchiectasis. Lung nodule - 454msubpleural nodule lateral RML    Plan:     Repeat CT scan w/o contrast in 3 months She declined FENO and walk test today due to time contraints  Follow up in 3 months  JeJacques EarthlyMD  Internal Medicine PGY-3 02/16/17 11:04 AM     STAFF NOTE: I, Dr M Ann Lionsave personally reviewed patient's available data, including medical history, events of note, physical examination and test results as part of my evaluation. I have discussed with resident/NP and other care providers such as pharmacist, RN and RRT.  In addition,  I personally evaluated patient and elicited key findings of   S: chronic cough resolved. But has new problem of feeling she has  to take deep breath. This is in settin gof immune suppression and recent pneumonia feb 2018 on CT chest . Fu CT chest June 2018 wth partial resolution   O: looks well oreiented x 3 CTA bilaterally  A: dyspnea Resolving  pna  P: fu 3 months with ct chest   .  Rest per NP/medical resident whose note is outlined above and that I agree with    Dr. Brand Males, M.D., [facility].C.P Pulmonary and Critical Care Medicine Staff Physician [place] [facility] Pager: [phone], If no answer or between  15:00h - 7:00h: call [phone]  02/21/2017 12:03 PM

## 2017-03-31 ENCOUNTER — Telehealth: Payer: Self-pay | Admitting: Internal Medicine

## 2017-03-31 NOTE — Telephone Encounter (Signed)
lmomtcb x1 

## 2017-04-04 NOTE — Telephone Encounter (Signed)
Called and spoke to pt. Pt states her GI is needing her to have a CT of her abdomen to see if she can come off her Humira. Pt is requesting the CT abdomen and CT chest be done at the same time in September. Advised pt to have her GI provider, Dr. Watt Climes, order and schedule scan and then she can call us once abd scan is schedule that way we can schedule our CT chest the same time as her CT abd. Pt verbalized understanding and denied any further questions or concerns at this time.

## 2017-04-26 ENCOUNTER — Telehealth: Payer: Self-pay | Admitting: Internal Medicine

## 2017-04-26 NOTE — Telephone Encounter (Signed)
LM for Barb with Dr Galen Daft office.

## 2017-04-27 NOTE — Telephone Encounter (Signed)
Barb with Dr. Perley Jain office is returning call, CB [phone].  Dr. Watt Climes is wanting to know if can add on abdomen and pelvis for patient's already scheduled CT (for patient's Crohn's disease).

## 2017-04-27 NOTE — Telephone Encounter (Signed)
Called and spoke to [place]. Pt is having a CT chest w/o CM at [place] on 05/19/2017. Dr. Watt Climes is needing a CT abdomen/pelvis with CM but because Dr. Watt Climes is not a [place] provider the pt cannot have that scan at [place]. Pt is wanting both scans done at this time.   Dr. Chase Caller please advise if ok to order CT abdomen under your name and CC Dr. Watt Climes with the CT abdomen OR we can change the location of the CT chest to a location Dr. Watt Climes can do scans.

## 2017-05-03 ENCOUNTER — Telehealth: Payer: Self-pay | Admitting: Internal Medicine

## 2017-05-03 DIAGNOSIS — K50919 Crohn's disease, unspecified, with unspecified complications: Secondary | ICD-10-CM

## 2017-05-03 NOTE — Telephone Encounter (Signed)
Spoke with Raford Pitcher at Dr. Perley Jain office. They are wanting to know if MR would be willing to add on a CT abdomen/pelvis. Dr. Watt Climes is wanting to check for Chron's Disease.  MR - please advise. Thanks.

## 2017-05-03 NOTE — Telephone Encounter (Signed)
ATC Dr. Perley Jain office but office is closed.  wcb tomorrow.

## 2017-05-03 NOTE — Telephone Encounter (Signed)
Pt called me to reschedule chest CT appt.  I gave her ph # for [facility]. She stated she also does not want to worry about schedule chest CT at same time as Dr Perley Jain CT's.  Gave this info to [place] & she states ok to go ahead and close encounter.  Nothing further needed.

## 2017-05-03 NOTE — Telephone Encounter (Signed)
We can definitely add BUT  1. We need to  Order BMET prior to pricedure 2. Need instructions for order like with or without contrast from Dr Watt Climes and also notification for results to go to Dr Watt Climes  Thanks  Dr. Brand Males, M.D., [facility].C.P Pulmonary and Critical Care Medicine Staff Physician [place] [facility] Pager: [phone], If no answer or between  15:00h - 7:00h: call [phone]  05/03/2017 4:39 PM

## 2017-05-03 NOTE — Telephone Encounter (Signed)
MR please advise. Thanks! 

## 2017-05-04 NOTE — Telephone Encounter (Signed)
Called and lmomtcb x 1 for Barb with Dr. Galen Daft office.

## 2017-05-04 NOTE — Telephone Encounter (Signed)
Left another message for Barb to call back. Call went straight to VM.

## 2017-05-04 NOTE — Telephone Encounter (Signed)
Jessica Bates said that she can be reached on her line [phone].

## 2017-05-04 NOTE — Telephone Encounter (Signed)
Barb calling back about order for CT.

## 2017-05-04 NOTE — Telephone Encounter (Signed)
Jessica Bates with Dr. Perley Jain office returning call > CT abd/pelvis with contrast for pt's Crohn's disease Order placed  LMOM TCB x1 to make pt aware she will need to have a BMET prior to her CTs on 9.4.18

## 2017-05-06 NOTE — Telephone Encounter (Signed)
lmtcb x2 for pt. 

## 2017-05-09 NOTE — Telephone Encounter (Signed)
LM x 3 Details left about needing blood work prior to CT scan

## 2017-05-10 NOTE — Telephone Encounter (Signed)
Pt is aware of labs prior to CT scan. Pt will come by the office at least 2 days prior to scan for lab work. Order placed and nothing more needed at this time.

## 2017-05-10 NOTE — Telephone Encounter (Signed)
Patient is returning phone call regarding blood work prior to [place]. Please call between 12 and 1:30.

## 2017-05-10 NOTE — Telephone Encounter (Signed)
Patient is returning phone call.  °

## 2017-05-13 ENCOUNTER — Other Ambulatory Visit (INDEPENDENT_AMBULATORY_CARE_PROVIDER_SITE_OTHER): Payer: BLUE CROSS/BLUE SHIELD

## 2017-05-13 DIAGNOSIS — K50919 Crohn's disease, unspecified, with unspecified complications: Secondary | ICD-10-CM

## 2017-05-13 LAB — BASIC METABOLIC PANEL
BUN: 12 mg/dL (ref 6–23)
CO2: 27 mEq/L (ref 19–32)
Calcium: 9.3 mg/dL (ref 8.4–10.5)
Chloride: 101 mEq/L (ref 96–112)
Creatinine, Ser: 0.73 mg/dL (ref 0.40–1.20)
GFR: 87.74 mL/min (ref >60.00)
Glucose, Bld: 94 mg/dL (ref 70–99)
Potassium: 3.8 mEq/L (ref 3.5–5.1)
Sodium: 135 mEq/L (ref 135–145)

## 2017-05-17 ENCOUNTER — Ambulatory Visit (INDEPENDENT_AMBULATORY_CARE_PROVIDER_SITE_OTHER)
Admission: RE | Admit: 2017-05-17 | Discharge: 2017-05-17 | Disposition: A | Discharge status: Home / Self Care | Payer: BLUE CROSS/BLUE SHIELD | Source: Ambulatory Visit | Attending: Internal Medicine | Admitting: Internal Medicine

## 2017-05-17 DIAGNOSIS — R0602 Shortness of breath: Secondary | ICD-10-CM | POA: Diagnosis not present

## 2017-05-17 DIAGNOSIS — K50919 Crohn's disease, unspecified, with unspecified complications: Secondary | ICD-10-CM | POA: Diagnosis not present

## 2017-05-17 DIAGNOSIS — Z8701 Personal history of pneumonia (recurrent): Secondary | ICD-10-CM

## 2017-05-17 DIAGNOSIS — R918 Other nonspecific abnormal finding of lung field: Secondary | ICD-10-CM | POA: Diagnosis not present

## 2017-05-17 MED ORDER — IOPAMIDOL (ISOVUE-300) INJECTION 61%
100.0000 mL | Freq: Once | INTRAVENOUS | Status: AC | PRN
  Administered 2017-05-17: 100 mL via INTRAVENOUS

## 2017-05-19 ENCOUNTER — Inpatient Hospital Stay: Admission: RE | Admit: 2017-05-19 | Payer: BLUE CROSS/BLUE SHIELD | Source: Ambulatory Visit

## 2017-05-23 ENCOUNTER — Telehealth: Payer: Self-pay | Admitting: Internal Medicine

## 2017-05-23 NOTE — Telephone Encounter (Signed)
Spoke with patient. She is aware of results. Appt has been made for 07/15/17 with MR. Nothing else needed at time of call.

## 2017-05-23 NOTE — Telephone Encounter (Signed)
Gus Height, MD 05/17/2017   Narrative    CLINICAL DATA: Crohn's disease. Pneumonia.  EXAM: CT CHEST, ABDOMEN, AND PELVIS WITH CONTRAST  TECHNIQUE: Multidetector CT imaging of the chest, abdomen and pelvis was performed following the standard protocol during bolus administration of intravenous contrast.  CONTRAST: 195m ISOVUE-300 IOPAMIDOL (ISOVUE-300) INJECTION 61%  COMPARISON: Chest CT 11/07/2016, 02/14/2017  FINDINGS: CT CHEST FINDINGS  Cardiovascular: No significant vascular findings. Normal heart size. No pericardial effusion.  Mediastinum/Nodes: No axillary or supraclavicular adenopathy. No mediastinal hilar adenopathy. Bilateral subglandular breast implants. Esophagus normal.  Lungs/Pleura: Continued improvement in the post inflammatory scarring in the medial lingula and RIGHT middle lobe at site of prior pneumonia. No new nodularity.  Small angular nodule in the subpleural RIGHT middle lobe measuring 4 mm is unchanged (image 84, series 3).  Musculoskeletal: No aggressive osseous lesion.  CT ABDOMEN AND PELVIS FINDINGS  Hepatobiliary: Tiny hypodense lesion in the dome the RIGHT hepatic lobe not changed. Normal gallbladder  Pancreas: Pancreas is normal. No ductal dilatation. No pancreatic inflammation.  Spleen: Normal spleen  Adrenals/urinary tract: Adrenal glands and kidneys are normal. The ureters and bladder normal.  Stomach/Bowel: Stomach and small bowel are normal. The terminal ileum appears normal with no stricture or mass. The appendix is not identified. Ascending and transverse colon are normal. The descending colon appears normal. Rectum normal by CT imaging.  Vascular/Lymphatic: Abdominal aorta is normal caliber. There is no retroperitoneal or periportal lymphadenopathy. No pelvic lymphadenopathy.  Reproductive: Uterus and ovaries normal  Other: No free-fluid.  Musculoskeletal: No aggressive osseous lesion.  IMPRESSION: Chest  Impression:  1. Continued improvement in post inflammatory scarring in the RIGHT middle lobe and lingula. 2. Stable small pulmonary nodule.  Abdomen / Pelvis Impression:  1. No evidence of active Crohn's disease by CT imaging. 2. No acute findings in the abdomen or pelvis.   Electronically Signed By: SSuzy BouchardM.D. On: 05/17/2017 10:05   Let her know  A) CT chest  - improved inflammation. Please give first avail appt to discuss  B) CT abd  No evidence of crohns. She needs to discuss with Dr MWatt Climesthe result. I only ordered it on his behalf  Dr. MBrand Males M.D., FA[facility]lC.P Pulmonary and Critical Care Medicine Staff Physician [facility] Pager: [phone] If no answer or between  15:00h - 7:00h: call [phone]  05/23/2017 4:02 PM

## 2017-05-23 NOTE — Telephone Encounter (Signed)
Pt requesting results of ct- pt had ct chest, abdomen, pelvis on 9/4 ordered by MR.  MR please advise on results.  Thanks!

## 2017-06-13 ENCOUNTER — Ambulatory Visit (INDEPENDENT_AMBULATORY_CARE_PROVIDER_SITE_OTHER)
Admission: RE | Admit: 2017-06-13 | Discharge: 2017-06-13 | Disposition: A | Discharge status: Home / Self Care | Payer: BLUE CROSS/BLUE SHIELD | Source: Ambulatory Visit | Attending: Acute Care | Admitting: Acute Care

## 2017-06-13 ENCOUNTER — Encounter: Payer: Self-pay | Admitting: Acute Care

## 2017-06-13 ENCOUNTER — Ambulatory Visit (INDEPENDENT_AMBULATORY_CARE_PROVIDER_SITE_OTHER): Payer: BLUE CROSS/BLUE SHIELD | Admitting: Acute Care

## 2017-06-13 VITALS — BP 106/62 | HR 65 | Ht 66.0 in | Wt 134.6 lb

## 2017-06-13 DIAGNOSIS — R0602 Shortness of breath: Secondary | ICD-10-CM

## 2017-06-13 DIAGNOSIS — R5383 Other fatigue: Secondary | ICD-10-CM | POA: Diagnosis not present

## 2017-06-13 DIAGNOSIS — R0789 Other chest pain: Secondary | ICD-10-CM

## 2017-06-13 LAB — NITRIC OXIDE: Nitric Oxide: 18

## 2017-06-13 NOTE — Assessment & Plan Note (Signed)
Generalized since 10/2016 HGB WNL as of 06/10/2016 Has recently come off Humira last month ( has been treated for last 5 years) Significant family hx CAD Plan: 12 Lead now Referral to cards Monitor HGB as needed

## 2017-06-13 NOTE — Patient Instructions (Addendum)
It is nice to meet you today. We will do a CXR today.  12 Lead EKG today. We will call you with the results. We will do a FENO today.( This was normal) We will schedule you for Pulmonary Function tests. We will refer you for a cardiology for evaluation of chest tightness. Follow up as needed Please contact office for sooner follow up if symptoms do not improve or worsen or seek emergency care

## 2017-06-13 NOTE — Progress Notes (Signed)
History of Present Illness Jessica Bates is a 56 y.o. female former smoker ( Quit 2000) with w/ Crohn disease, HLD presenting for follow up of chronic cough. She is followed by Dr. Chase Caller.   06/13/2017 Acute OV: Pt. Presents for an acute visit . She states she had a pneumonia 10/2016 and  she has not felt well since. She was treated with ceftriaxone and azithromycin then she was prescribed ciprofloxacin. She did not improve so she was treated with Levaquin a few weeks later.She presents today with symptoms of fatigue, chest pressure, sinus pressure and mild headache. She is concerned this may be related to the pneumonia she had in February. She was seen at [facility] on 06/10/2017 for the same symptoms.They did labs which were WNL but did not do a CXR.She had a CT scan 05/17/17 as nodule follow up,  which did not indicate pneumonia. It indicated stable nodules.She states her symptoms have worsened since CT on 05/17/2017. She states she has noticed that her symptoms  improve whenever she is on antibiotics. She states chest tightness is worse in the afternoon. She states she has some light headedness with her chest tightness.She denies chest pain, arm pain, jaw pain  or exertional pain. She states the tightness does not get worse with exertion. She has never had a stress test or echo done.She has been on statin medication since her early 20's for elevated cholesterol. She has family history of CAD in her father who had CABG at the age of 17. She denies chest pain, orthopnea or hemoptysis.She was on Humira  for her Chrohn's disease for the last 5 years. She stopped the medication about 1 month ago.  Test Results:  FENO>> 06/13/2017>>18 ppb  CXR>> 06/13/2017>> No active cardiopulmonary disease.  12 Lead>> Sinus Bradycardia  CT Chest 05/17/2017>Continued improvement in post inflammatory scarring in the RIGHT middle lobe and lingula. Stable small pulmonary nodule.  FENO 05/31/2016: 16 ppb  CXR  05/03/2016: No active cardio-pulmonary process.  CTA chest 11/07/2016: no PE, multifocal bilateral airspace disease worse in lingula and RML, small pleural effusions  CT chest w contrast 02/14/2017: mild residual density inferiorly in lingulair segment of LUL and anterior portion or RML, improved from prior exam, 24m subpleural nodule lateral RML   CBC Latest Ref Rng & Units 11/07/2016  WBC 4.0 - 10.5 K/uL 13.3(H)  Hemoglobin 12.0 - 15.0 g/dL 11.7(L)  Hematocrit 36.0 - 46.0 % 36.1  Platelets 150 - 400 K/uL 425(H)    BMP Latest Ref Rng & Units 05/13/2017 11/07/2016  Glucose 70 - 99 mg/dL 94 100(H)  BUN 6 - 23 mg/dL 12 7  Creatinine 0.40 - 1.20 mg/dL 0.73 0.70  Sodium 135 - 145 mEq/L 135 137  Potassium 3.5 - 5.1 mEq/L 3.8 3.7  Chloride 96 - 112 mEq/L 101 105  CO2 19 - 32 mEq/L 27 22  Calcium 8.4 - 10.5 mg/dL 9.3 9.0     Dg Chest 2 View  Result Date: 06/13/2017 CLINICAL DATA:  Dyspnea, cough. EXAM: CHEST  2 VIEW COMPARISON:  Radiographs of Feb 04, 2017. FINDINGS: The heart size and mediastinal contours are within normal limits. Right lung is clear. No pneumothorax or pleural effusion is noted. Probable lingular scarring is noted. No acute pulmonary disease is noted. The visualized skeletal structures are unremarkable. IMPRESSION: No active cardiopulmonary disease. Electronically Signed   By: JMarijo Conception M.D.   On: 06/13/2017 11:16   Ct Chest W Contrast  Result Date: 05/17/2017  CLINICAL DATA:  Crohn's disease.  Pneumonia. EXAM: CT CHEST, ABDOMEN, AND PELVIS WITH CONTRAST TECHNIQUE: Multidetector CT imaging of the chest, abdomen and pelvis was performed following the standard protocol during bolus administration of intravenous contrast. CONTRAST:  151m ISOVUE-300 IOPAMIDOL (ISOVUE-300) INJECTION 61% COMPARISON:  Chest CT 11/07/2016, 02/14/2017 FINDINGS: CT CHEST FINDINGS Cardiovascular: No significant vascular findings. Normal heart size. No pericardial effusion. Mediastinum/Nodes: No  axillary or supraclavicular adenopathy. No mediastinal hilar adenopathy. Bilateral subglandular breast implants. Esophagus normal. Lungs/Pleura: Continued improvement in the post inflammatory scarring in the medial lingula and RIGHT middle lobe at site of prior pneumonia. No new nodularity. Small angular nodule in the subpleural RIGHT middle lobe measuring 4 mm is unchanged (image 84, series 3). Musculoskeletal: No aggressive osseous lesion. CT ABDOMEN AND PELVIS FINDINGS Hepatobiliary: Tiny hypodense lesion in the dome the RIGHT hepatic lobe not changed. Normal gallbladder Pancreas: Pancreas is normal. No ductal dilatation. No pancreatic inflammation. Spleen: Normal spleen Adrenals/urinary tract: Adrenal glands and kidneys are normal. The ureters and bladder normal. Stomach/Bowel: Stomach and small bowel are normal. The terminal ileum appears normal with no stricture or mass. The appendix is not identified. Ascending and transverse colon are normal. The descending colon appears normal. Rectum normal by CT imaging. Vascular/Lymphatic: Abdominal aorta is normal caliber. There is no retroperitoneal or periportal lymphadenopathy. No pelvic lymphadenopathy. Reproductive: Uterus and ovaries normal Other: No free-fluid. Musculoskeletal: No aggressive osseous lesion. IMPRESSION: Chest Impression: 1. Continued improvement in post inflammatory scarring in the RIGHT middle lobe and lingula. 2. Stable small pulmonary nodule. Abdomen / Pelvis Impression: 1. No evidence of active Crohn's disease by CT imaging. 2. No acute findings in the abdomen or pelvis. Electronically Signed   By: SSuzy BouchardM.D.   On: 05/17/2017 10:05   Ct Abdomen Pelvis W Contrast  Result Date: 05/17/2017 CLINICAL DATA:  Crohn's disease.  Pneumonia. EXAM: CT CHEST, ABDOMEN, AND PELVIS WITH CONTRAST TECHNIQUE: Multidetector CT imaging of the chest, abdomen and pelvis was performed following the standard protocol during bolus administration of  intravenous contrast. CONTRAST:  1046mISOVUE-300 IOPAMIDOL (ISOVUE-300) INJECTION 61% COMPARISON:  Chest CT 11/07/2016, 02/14/2017 FINDINGS: CT CHEST FINDINGS Cardiovascular: No significant vascular findings. Normal heart size. No pericardial effusion. Mediastinum/Nodes: No axillary or supraclavicular adenopathy. No mediastinal hilar adenopathy. Bilateral subglandular breast implants. Esophagus normal. Lungs/Pleura: Continued improvement in the post inflammatory scarring in the medial lingula and RIGHT middle lobe at site of prior pneumonia. No new nodularity. Small angular nodule in the subpleural RIGHT middle lobe measuring 4 mm is unchanged (image 84, series 3). Musculoskeletal: No aggressive osseous lesion. CT ABDOMEN AND PELVIS FINDINGS Hepatobiliary: Tiny hypodense lesion in the dome the RIGHT hepatic lobe not changed. Normal gallbladder Pancreas: Pancreas is normal. No ductal dilatation. No pancreatic inflammation. Spleen: Normal spleen Adrenals/urinary tract: Adrenal glands and kidneys are normal. The ureters and bladder normal. Stomach/Bowel: Stomach and small bowel are normal. The terminal ileum appears normal with no stricture or mass. The appendix is not identified. Ascending and transverse colon are normal. The descending colon appears normal. Rectum normal by CT imaging. Vascular/Lymphatic: Abdominal aorta is normal caliber. There is no retroperitoneal or periportal lymphadenopathy. No pelvic lymphadenopathy. Reproductive: Uterus and ovaries normal Other: No free-fluid. Musculoskeletal: No aggressive osseous lesion. IMPRESSION: Chest Impression: 1. Continued improvement in post inflammatory scarring in the RIGHT middle lobe and lingula. 2. Stable small pulmonary nodule. Abdomen / Pelvis Impression: 1. No evidence of active Crohn's disease by CT imaging. 2. No acute findings in the abdomen or  pelvis. Electronically Signed   By: Suzy Bouchard M.D.   On: 05/17/2017 10:05     Past medical hx Past  Medical History:  Diagnosis Date  . Asthma   . Crohn disease ([place])   . Depression meopause  . Dyslipidemia   . Fibromyalgia   . Hemorrhoids   . Melanoma [facility])      Social History  Substance Use Topics  . Smoking status: Former Smoker    Packs/day: 0.25    Years: 15.00    Types: Cigarettes    Quit date: 09/13/1998  . Smokeless tobacco: Never Used  . Alcohol use Yes     Comment: occassional    Ms.Co reports that she quit smoking about 18 years ago. Her smoking use included Cigarettes. She has a 3.75 pack-year smoking history. She has never used smokeless tobacco. She reports that she drinks alcohol. She reports that she does not use drugs.  Tobacco Cessation: Former smoker  Quit 2000  Past surgical hx, Family hx, Social hx all reviewed.  Current Outpatient Prescriptions on File Prior to Visit  Medication Sig  . atorvastatin (LIPITOR) 40 MG tablet Take 40 mg by mouth daily.  . cetirizine (ZYRTEC) 10 MG tablet Take 10 mg by mouth daily.  Marland Kitchen estradiol (VIVELLE-DOT) 0.075 MG/24HR Place 1 patch onto the skin See admin instructions. Sunday Tuesday  . omeprazole (PRILOSEC) 40 MG capsule Take 40 mg by mouth 2 (two) times daily.  . progesterone (PROMETRIUM) 100 MG capsule Take 100 mg by mouth daily.  Marland Kitchen zolpidem (AMBIEN) 10 MG tablet Take 10 mg by mouth at bedtime as needed for sleep.   No current facility-administered medications on file prior to visit.      Allergies  Allergen Reactions  . Cymbalta [Duloxetine Hcl]     Pt unsure   . Penicillins     Rash   . Remicade [Infliximab]     Flushing    Review Of Systems:  Constitutional:   No  weight loss, night sweats,  Fevers, chills, + fatigue, or  lassitude.  HEENT:   + Mild  headaches,  No Difficulty swallowing,  Tooth/dental problems, or  Sore throat,                No sneezing, itching, ear ache, + nasal congestion, post nasal drip,   CV:  No chest pain, but chest tightness, No  Orthopnea, PND, swelling in lower  extremities, anasarca, dizziness, palpitations, syncope.   GI  No heartburn, indigestion, abdominal pain, nausea, vomiting, diarrhea, change in bowel habits, loss of appetite, bloody stools.   Resp: No shortness of breath with exertion or at rest.  No excess mucus, no productive cough,  No non-productive cough,  No coughing up of blood.  No change in color of mucus.  No wheezing.  No chest wall deformity  Skin: no rash or lesions.  GU: no dysuria, change in color of urine, no urgency or frequency.  No flank pain, no hematuria   MS:  No joint pain or swelling.  No decreased range of motion.  No back pain.  Psych:  No change in mood or affect. No depression or anxiety.  No memory loss.   Vital Signs BP 106/62 (BP Location: Left Arm, Cuff Size: Normal)   Pulse 65   Ht 5' 6"  (1.676 m)   Wt 134 lb 9.6 oz (61.1 kg)   LMP 02/14/2014 Comment: bcp//A.C.  SpO2 100%   BMI 21.73 kg/m    Physical Exam:  General- No distress,  A&Ox3, pleasant and appropriate ENT: No sinus tenderness, TM clear, pale nasal mucosa, no oral exudate,no post nasal drip, no LAN Cardiac: S1, S2, regular rate and rhythm, no murmur Chest: No wheeze/ rales/ dullness; no accessory muscle use, no nasal flaring, no sternal retractions Abd.: Soft Non-tender, non-distended, BS + Ext: No clubbing cyanosis, edema Neuro:  normal strength, cranial nerves intact Skin: No rashes, warm and dry Psych: normal mood and behavior   Assessment/Plan  Chest tightness Chest tightness that has been present on and off since 10/2016 when she had pneumonia She has recently come off Humira after 5 years ( Chrohn's Disease) Never had echo or stress testing  Family history of CAD Personal Hx. Of Hyperlipidemia Plan: We will do a CXR today.  We will call you with results 12 Lead EKG today. We will call you with the results. We will do a FENO today.( This was normal) We will schedule you for Pulmonary Function tests. We will call you  with results and if abnormal have you come in to review. We will refer you for a cardiology for evaluation of chest tightness. Cholesterol monitoring per PCP Follow up as needed. Please contact office for sooner follow up if symptoms do not improve or worsen or seek emergency care     Fatigue Generalized since 10/2016 HGB WNL as of 06/10/2016 Has recently come off Humira last month ( has been treated for last 5 years) Significant family hx CAD Plan: 12 Lead now Referral to cards Monitor HGB as needed     Magdalen Spatz, NP 06/13/2017  2:45 PM

## 2017-06-13 NOTE — Assessment & Plan Note (Addendum)
Chest tightness that has been present on and off since 10/2016 when she had pneumonia She has recently come off Humira after 5 years ( Chrohn's Disease) Never had echo or stress testing  Family history of CAD Personal Hx. Of Hyperlipidemia Plan: We will do a CXR today.  We will call you with results 12 Lead EKG today. We will call you with the results. We will do a FENO today.( This was normal) We will schedule you for Pulmonary Function tests. We will call you with results and if abnormal have you come in to review. We will refer you for a cardiology for evaluation of chest tightness. Cholesterol monitoring per PCP Follow up as needed. Please contact office for sooner follow up if symptoms do not improve or worsen or seek emergency care

## 2017-06-14 ENCOUNTER — Ambulatory Visit (INDEPENDENT_AMBULATORY_CARE_PROVIDER_SITE_OTHER): Payer: BLUE CROSS/BLUE SHIELD | Admitting: Internal Medicine

## 2017-06-14 DIAGNOSIS — R0602 Shortness of breath: Secondary | ICD-10-CM

## 2017-06-14 LAB — PULMONARY FUNCTION TEST
DL/VA % pred: 73 %
DL/VA: 3.61 ml/min/mmHg/L
DLCO cor % pred: 62 %
DLCO cor: 16.11 ml/min/mmHg
DLCO unc % pred: 65 %
DLCO unc: 16.68 ml/min/mmHg
FEF 25-75 Post: 2.08 L/sec
FEF 25-75 Pre: 1.56 L/sec
FEF2575-%Change-Post: 33 %
FEF2575-%Pred-Post: 80 %
FEF2575-%Pred-Pre: 60 %
FEV1-%Change-Post: 6 %
FEV1-%Pred-Post: 87 %
FEV1-%Pred-Pre: 82 %
FEV1-Post: 2.43 L
FEV1-Pre: 2.27 L
FEV1FVC-%Change-Post: 6 %
FEV1FVC-%Pred-Pre: 91 %
FEV6-%Change-Post: 0 %
FEV6-%Pred-Post: 91 %
FEV6-%Pred-Pre: 91 %
FEV6-Post: 3.14 L
FEV6-Pre: 3.14 L
FEV6FVC-%Change-Post: 0 %
FEV6FVC-%Pred-Post: 103 %
FEV6FVC-%Pred-Pre: 102 %
FVC-%Change-Post: 0 %
FVC-%Pred-Post: 88 %
FVC-%Pred-Pre: 88 %
FVC-Post: 3.14 L
FVC-Pre: 3.15 L
Post FEV1/FVC ratio: 77 %
Post FEV6/FVC ratio: 100 %
Pre FEV1/FVC ratio: 72 %
Pre FEV6/FVC Ratio: 100 %
RV % pred: 103 %
RV: 2.01 L
TLC % pred: 96 %
TLC: 5.02 L

## 2017-06-14 NOTE — Progress Notes (Signed)
PFT done today. 

## 2017-06-20 ENCOUNTER — Encounter: Payer: Self-pay | Admitting: Cardiovascular Disease

## 2017-06-21 ENCOUNTER — Telehealth: Payer: Self-pay | Admitting: Internal Medicine

## 2017-06-21 MED ORDER — BUDESONIDE-FORMOTEROL FUMARATE 80-4.5 MCG/ACT IN AERO: 2.0000 | INHALATION_SPRAY | Freq: Two times a day (BID) | RESPIRATORY_TRACT | 0 refills | Status: DC

## 2017-06-21 NOTE — Telephone Encounter (Signed)
Notes recorded by Magdalen Spatz, NP on 06/17/2017 at 4:47 PM EDT Please call patient let her know that her pulmonary function tests indicate a very minimal obstruction. Let her know I think the right next step is to see her cardiologist for further evaluation. There is additional imaging they can do including a 2-D echo, or a cardiopulmonary stress test. Additionally, let her know we can do a therapeutic trial of Symbicort 80, 2 puffs twice daily to see if she notices any difference in her chest tightness. Reminded her to rinse her mouth after use. If she is interested please have her come in to the office for training with one of the nursing staff on how to use the inhaler. If she decides she would like to do a therapeutic trial of the Symbicort, please schedule her for a follow-up appointment in 2 weeks so that we can evaluate. Thank you so much --------------------- Spoke with pt, aware of recs.  Pt has already scheduled a rov with cardiology.  Scheduled rov with SG, placed sample up front for pickup.  Nothing further needed.

## 2017-06-29 NOTE — Progress Notes (Signed)
Chief Complaint  Patient presents with  . New Patient (Initial Visit)    chest tightness    History of Present Illness: 56 yo female with history of former tobacco abuse, hyperlipidemia, asthma, fibromyalgia, Crohn disease who is here today as a new consult, referred by Dr. Rex Kras, for evaluation of chest pain. She has been followed in the pulmonary office following several bouts of pneumonia. She also has pulmonary nodules. She tells me that she has no prior cardiac issues. She describes pressure in her chest, mostly at rest. No change with exertion. This felt like her chest pressure a/w her prior pneumonia. No associated nausea or diaphoresis. The chest pressure can last for hours. Occur several times per week. She does have GERD and a hiatal hernia. She exercises and has no chest pain with exercise. She smoked for about 15 years, stopping in 2000. No LE edema, dizziness, palpitations.   Primary Care Physician: Hulan Fess, MD  Past Medical History:  Diagnosis Date  . Asthma   . Crohn disease ([place])   . Depression meopause  . Dyslipidemia   . Fibromyalgia   . GERD (gastroesophageal reflux disease)   . Hemorrhoids   . Hiatal hernia   . Melanoma ([place])   . Pulmonary nodules     Past Surgical History:  Procedure Laterality Date  . MELANOMA EXCISION      Current Outpatient Prescriptions  Medication Sig Dispense Refill  . atorvastatin (LIPITOR) 40 MG tablet Take 40 mg by mouth daily.    . budesonide-formoterol (SYMBICORT) 80-4.5 MCG/ACT inhaler Inhale 2 puffs into the lungs 2 (two) times daily. 1 Inhaler 0  . cetirizine (ZYRTEC) 10 MG tablet Take 10 mg by mouth daily.    Marland Kitchen estradiol (VIVELLE-DOT) 0.075 MG/24HR Place 1 patch onto the skin See admin instructions. Sunday Tuesday    . mesalamine (APRISO) 0.375 g 24 hr capsule Take 375 mg by mouth daily.    Marland Kitchen omeprazole (PRILOSEC) 40 MG capsule Take 40 mg by mouth 2 (two) times daily.    . progesterone (PROMETRIUM) 100 MG capsule Take  100 mg by mouth daily.    Marland Kitchen zolpidem (AMBIEN) 10 MG tablet Take 10 mg by mouth at bedtime as needed for sleep.     No current facility-administered medications for this visit.     Allergies  Allergen Reactions  . Cymbalta [Duloxetine Hcl]     Pt unsure   . Penicillins     Rash   . Remicade [Infliximab]     Flushing    Social History   Social History  . Marital status: Married    Spouse name: N/A  . Number of children: 3  . Years of education: N/A   Occupational History  . Works at [employer] History Main Topics  . Smoking status: Former Smoker    Packs/day: 0.25    Years: 15.00    Types: Cigarettes    Quit date: 09/13/1998  . Smokeless tobacco: Never Used  . Alcohol use Yes     Comment: occasional  . Drug use: No  . Sexual activity: Not on file   Other Topics Concern  . Not on file   Social History Narrative   Assistant [employer]        Family History  Problem Relation Age of Onset  . Leukemia Father   . CAD Father   . Crohn's disease Brother     Review of Systems:  As stated in  the HPI and otherwise negative.   BP 108/66   Pulse 67   Ht 5' 6"  (1.676 m)   Wt 136 lb 12.8 oz (62.1 kg)   LMP 02/14/2014 Comment: bcp//A.C.  SpO2 99%   BMI 22.08 kg/m   Physical Examination: General: Well developed, well nourished, NAD  HEENT: OP clear, mucus membranes moist  SKIN: warm, dry. No rashes. Neuro: No focal deficits  Musculoskeletal: Muscle strength 5/5 all ext  Psychiatric: Mood and affect normal  Neck: No JVD, no carotid bruits, no thyromegaly, no lymphadenopathy.  Lungs:Clear bilaterally, no wheezes, rhonci, crackles Cardiovascular: Regular rate and rhythm. No murmurs, gallops or rubs. Abdomen:Soft. Bowel sounds present. Non-tender.  Extremities: No lower extremity edema. Pulses are 2 + in the bilateral DP/PT.  EKG:  EKG is not ordered today. The ekg from 06/13/17 is reviewed today by me and shows sinus brady,rate  59 bpm  Recent Labs: 11/07/2016: Hemoglobin 11.7; Platelets 425 05/13/2017: BUN 12; Creatinine, Ser 0.73; Potassium 3.8; Sodium 135   Lipid Panel No results found for: CHOL, TRIG, HDL, CHOLHDL, VLDL, LDLCALC, LDLDIRECT   Wt Readings from Last 3 Encounters:  06/30/17 136 lb 12.8 oz (62.1 kg)  06/13/17 134 lb 9.6 oz (61.1 kg)  02/16/17 131 lb 12.8 oz (59.8 kg)     Other studies Reviewed: Additional studies/ records that were reviewed today include: . Review of the above records demonstrates:    Assessment and Plan:   1. Chest pain:  2. Dyspnea  Her chest pain is atypical but given prior tobacco abuse and FH of CAD, will arrange exercise stress test to exclude ischemia. Given dyspnea, will arrange echocardiogram to assess LVEF and exclude valve disease.   Current medicines are reviewed at length with the patient today.  The patient does not have concerns regarding medicines.  The following changes have been made:  no change  Labs/ tests ordered today include:   Orders Placed This Encounter  Procedures  . Exercise Tolerance Test  . ECHOCARDIOGRAM COMPLETE     Disposition:   FU with me in 6 weeks   Signed, Lauree Chandler, MD 06/30/2017 9:33 AM    [facility] [address], [place], [place]  [address] Phone: [phone]; Fax: [phone]

## 2017-06-30 ENCOUNTER — Ambulatory Visit (INDEPENDENT_AMBULATORY_CARE_PROVIDER_SITE_OTHER): Payer: BLUE CROSS/BLUE SHIELD | Admitting: Cardiovascular Disease

## 2017-06-30 ENCOUNTER — Encounter: Payer: Self-pay | Admitting: Cardiovascular Disease

## 2017-06-30 VITALS — BP 108/66 | HR 67 | Ht 66.0 in | Wt 136.8 lb

## 2017-06-30 DIAGNOSIS — R072 Precordial pain: Secondary | ICD-10-CM

## 2017-06-30 DIAGNOSIS — R0602 Shortness of breath: Secondary | ICD-10-CM

## 2017-06-30 NOTE — Patient Instructions (Addendum)
Medication Instructions:  Your physician recommends that you continue on your current medications as directed. Please refer to the Current Medication list given to you today.   Labwork: none  Testing/Procedures: Your physician has requested that you have an exercise tolerance test. For further information please visit [email or web address]. Please also follow instruction sheet, as given.  Your physician has requested that you have an echocardiogram. Echocardiography is a painless test that uses sound waves to create images of your heart. It provides your doctor with information about the size and shape of your heart and how well your heart's chambers and valves are working. This procedure takes approximately one hour. There are no restrictions for this procedure.    Follow-Up: Your physician recommends that you schedule a follow-up appointment in: about 6 weeks. --Scheduled for November 29,2018 at 8:40    Any Other Special Instructions Will Be Listed Below (If Applicable).     If you need a refill on your cardiac medications before your next appointment, please call your pharmacy.

## 2017-07-04 ENCOUNTER — Ambulatory Visit: Payer: BLUE CROSS/BLUE SHIELD | Admitting: Acute Care

## 2017-07-04 ENCOUNTER — Ambulatory Visit: Payer: BLUE CROSS/BLUE SHIELD | Admitting: Internal Medicine

## 2017-07-13 ENCOUNTER — Ambulatory Visit (INDEPENDENT_AMBULATORY_CARE_PROVIDER_SITE_OTHER): Payer: BLUE CROSS/BLUE SHIELD | Admitting: Acute Care

## 2017-07-13 ENCOUNTER — Encounter: Payer: Self-pay | Admitting: Acute Care

## 2017-07-13 ENCOUNTER — Other Ambulatory Visit (HOSPITAL_COMMUNITY): Payer: BLUE CROSS/BLUE SHIELD

## 2017-07-13 DIAGNOSIS — R053 Chronic cough: Secondary | ICD-10-CM

## 2017-07-13 DIAGNOSIS — R05 Cough: Secondary | ICD-10-CM

## 2017-07-13 NOTE — Patient Instructions (Addendum)
It is good to see you again. Continue to use the Symbicort 2 puffs twice daily. Rinse mouth after use Follow up for Cardiology diagnostics as is scheduled. Follow up CT 05/2018 for follow up pulmonary nodule. Follow up with pulmonary as needed.  Please contact office for sooner follow up if symptoms do not improve or worsen or seek emergency care .

## 2017-07-13 NOTE — Progress Notes (Signed)
History of Present Illness Jessica Bates is a 56 y.o. female former smoker ( Quit 2000) with w/ Crohn disease, HLD, asthma and fibromyalgia. She presented all2018 for follow up of chronic cough and dyspnea.  She is followed by Dr. Chase Caller.   07/13/2017 Follow up OV for cough and dyspnea.  Patient presents for follow-up.  She is no longer on Humira. She is on Apriso that she does not want to take biologic medications and risk another pneumonia due to being immunocompromised.. She states she has been doing better, but she was not using her Symbicort twice daily as she should have been. She wants to try using the Symbicort twice daily to see if she has continued improvement.  She went to see cardiology, and is scheduled for 2D echo and cardiopulmonary stress test.  She denies fever, chest pain, orthopnea, or hemoptysis.   Test Results: PFTs>> 06/14/2017>> mild obstruction>> slightly decreased DLCO  FENO>> 06/13/2017>>18 ppb  CXR>> 06/13/2017>> No active cardiopulmonary disease.  12 Lead>> Sinus Bradycardia  CT Chest 05/17/2017>Continued improvement in post inflammatory scarring in the RIGHT middle lobe and lingula. Stable small pulmonary nodule.  FENO 05/31/2016:16 ppb  CXR 05/03/2016:No active cardio-pulmonary process.  CTA chest 11/07/2016: no PE, multifocal bilateral airspace disease worse in lingula and RML, small pleural effusions  CT chest w contrast 02/14/2017: mild residual density inferiorly in lingulair segment of LUL and anterior portion or RML, improved from prior exam, 3m subpleural nodule lateral RML  CBC Latest Ref Rng & Units 11/07/2016  WBC 4.0 - 10.5 K/uL 13.3(H)  Hemoglobin 12.0 - 15.0 g/dL 11.7(L)  Hematocrit 36.0 - 46.0 % 36.1  Platelets 150 - 400 K/uL 425(H)    BMP Latest Ref Rng & Units 05/13/2017 11/07/2016  Glucose 70 - 99 mg/dL 94 100(H)  BUN 6 - 23 mg/dL 12 7  Creatinine 0.40 - 1.20 mg/dL 0.73 0.70  Sodium 135 - 145 mEq/L 135 137  Potassium 3.5 -  5.1 mEq/L 3.8 3.7  Chloride 96 - 112 mEq/L 101 105  CO2 19 - 32 mEq/L 27 22  Calcium 8.4 - 10.5 mg/dL 9.3 9.0     PFT    Component Value Date/Time   FEV1PRE 2.27 06/14/2017 1603   FEV1POST 2.43 06/14/2017 1603   FVCPRE 3.15 06/14/2017 1603   FVCPOST 3.14 06/14/2017 1603   TLC 5.02 06/14/2017 1603   DLCOUNC 16.68 06/14/2017 1603   PREFEV1FVCRT 72 06/14/2017 1603   PSTFEV1FVCRT 77 06/14/2017 1603    No results found.   Past medical hx Past Medical History:  Diagnosis Date  . Asthma   . Crohn disease ([place]   . Depression meopause  . Dyslipidemia   . Fibromyalgia   . GERD (gastroesophageal reflux disease)   . Hemorrhoids   . Hiatal hernia   . Melanoma ([place]   . Pulmonary nodules      Social History  Substance Use Topics  . Smoking status: Former Smoker    Packs/day: 0.25    Years: 15.00    Types: Cigarettes    Quit date: 09/13/1998  . Smokeless tobacco: Never Used  . Alcohol use Yes     Comment: occasional    Ms.BHowzereports that she quit smoking about 18 years ago. Her smoking use included Cigarettes. She has a 3.75 pack-year smoking history. She has never used smokeless tobacco. She reports that she drinks alcohol. She reports that she does not use drugs.  Tobacco Cessation: Former smoker quit 09/13/1998 with a 3.75-pack-year smoking history  Past surgical hx, Family hx, Social hx all reviewed.  Current Outpatient Prescriptions on File Prior to Visit  Medication Sig  . atorvastatin (LIPITOR) 40 MG tablet Take 40 mg by mouth daily.  . budesonide-formoterol (SYMBICORT) 80-4.5 MCG/ACT inhaler Inhale 2 puffs into the lungs 2 (two) times daily.  . cetirizine (ZYRTEC) 10 MG tablet Take 10 mg by mouth daily.  Marland Kitchen estradiol (VIVELLE-DOT) 0.075 MG/24HR Place 1 patch onto the skin See admin instructions. Sunday Tuesday  . mesalamine (APRISO) 0.375 g 24 hr capsule Take 375 mg by mouth daily.  Marland Kitchen omeprazole (PRILOSEC) 40 MG capsule Take 40 mg by mouth 2 (two) times  daily.  . progesterone (PROMETRIUM) 100 MG capsule Take 100 mg by mouth daily.   No current facility-administered medications on file prior to visit.      Allergies  Allergen Reactions  . Cymbalta [Duloxetine Hcl]     Pt unsure   . Penicillins     Rash   . Remicade [Infliximab]     Flushing    Review Of Systems:  Constitutional:   No  weight loss, night sweats,  Fevers, chills, fatigue, or  lassitude.  HEENT:   No headaches,  Difficulty swallowing,  Tooth/dental problems, or  Sore throat,                No sneezing, itching, ear ache, nasal congestion, post nasal drip,   CV:  No chest pain,  Orthopnea, PND, swelling in lower extremities, anasarca, dizziness, palpitations, syncope.   GI  No heartburn, indigestion, abdominal pain, nausea, vomiting, diarrhea, change in bowel habits, loss of appetite, bloody stools.   Resp: + shortness of breath with exertion not  at rest.  No excess mucus, no productive cough,  + non-productive cough,  No coughing up of blood.  No change in color of mucus.  No wheezing.  No chest wall deformity  Skin: no rash or lesions.  GU: no dysuria, change in color of urine, no urgency or frequency.  No flank pain, no hematuria   MS:  No joint pain or swelling.  No decreased range of motion.  No back pain.  Psych:  No change in mood or affect. No depression or anxiety.  No memory loss.   Vital Signs BP 116/68 (BP Location: Left Arm, Cuff Size: Normal)   Pulse 68   Ht 5' 6"  (1.676 m)   Wt 136 lb 6.4 oz (61.9 kg)   LMP 02/14/2014 Comment: bcp//A.C.  SpO2 100%   BMI 22.02 kg/m    Physical Exam:  General- No distress,  A&Ox3, pleasant ENT: No sinus tenderness, TM clear, pale nasal mucosa, no oral exudate,no post nasal drip, no LAN Cardiac: S1, S2, regular rate and rhythm, no murmur Chest: No wheeze/ rales/ dullness; no accessory muscle use, no nasal flaring, no sternal retractions Abd.: Soft Non-tender nondistended, nonobese,  Ext: No clubbing  cyanosis, edema Neuro:  normal strength Skin: No rashes, warm and dry Psych: normal mood and behavior   Assessment/Plan  Chronic cough Chronic cough with dyspnea Slowly improving CT negative for ILD Pulmonary function test positive for mild obstruction Plan Continue to use the Symbicort 2 puffs twice daily. Rinse mouth after use Follow up for Cardiology diagnostics as is scheduled. Follow up CT 05/2018 for follow up pulmonary nodule. Follow up with pulmonary as needed.  Please contact office for sooner follow up if symptoms do not improve or worsen or seek emergency care .     Magdalen Spatz, NP  07/13/2017  7:37 PM

## 2017-07-13 NOTE — Assessment & Plan Note (Signed)
Chronic cough with dyspnea Slowly improving CT negative for ILD Pulmonary function test positive for mild obstruction Plan Continue to use the Symbicort 2 puffs twice daily. Rinse mouth after use Follow up for Cardiology diagnostics as is scheduled. Follow up CT 05/2018 for follow up pulmonary nodule. Follow up with pulmonary as needed.  Please contact office for sooner follow up if symptoms do not improve or worsen or seek emergency care .

## 2017-07-15 ENCOUNTER — Ambulatory Visit: Payer: BLUE CROSS/BLUE SHIELD | Admitting: Internal Medicine

## 2017-07-19 ENCOUNTER — Other Ambulatory Visit (HOSPITAL_COMMUNITY): Payer: BLUE CROSS/BLUE SHIELD

## 2017-07-27 ENCOUNTER — Ambulatory Visit (HOSPITAL_COMMUNITY): Payer: BLUE CROSS/BLUE SHIELD | Attending: Cardiology

## 2017-07-27 ENCOUNTER — Ambulatory Visit (INDEPENDENT_AMBULATORY_CARE_PROVIDER_SITE_OTHER): Payer: BLUE CROSS/BLUE SHIELD

## 2017-07-27 ENCOUNTER — Other Ambulatory Visit: Payer: Self-pay

## 2017-07-27 DIAGNOSIS — R0602 Shortness of breath: Secondary | ICD-10-CM

## 2017-07-27 DIAGNOSIS — R072 Precordial pain: Secondary | ICD-10-CM | POA: Diagnosis not present

## 2017-07-27 DIAGNOSIS — E785 Hyperlipidemia, unspecified: Secondary | ICD-10-CM | POA: Diagnosis not present

## 2017-07-27 DIAGNOSIS — Z87891 Personal history of nicotine dependence: Secondary | ICD-10-CM | POA: Insufficient documentation

## 2017-07-27 LAB — EXERCISE TOLERANCE TEST
Estimated workload: 11.7 METS
Exercise duration (min): 10 min
Exercise duration (sec): 0 s
MPHR: 166 {beats}/min
Peak HR: 160 {beats}/min
Percent HR: 96 %
RPE: 16
Rest HR: 70 {beats}/min

## 2017-08-01 ENCOUNTER — Telehealth: Payer: Self-pay | Admitting: *Deleted

## 2017-08-01 NOTE — Telephone Encounter (Signed)
I spoke with pt and reviewed stress test and echo results with her.  She has follow up appointment on 08/11/17 and states Dr. Angelena Form  told her she could cancel this appointment if testing was OK.  She reports she is feeling fine.  Will cancel appt for 08/11/17 and plan on one year follow up.

## 2017-08-11 ENCOUNTER — Ambulatory Visit: Payer: BLUE CROSS/BLUE SHIELD | Admitting: Cardiovascular Disease

## 2017-08-16 DIAGNOSIS — J0110 Acute frontal sinusitis, unspecified: Secondary | ICD-10-CM | POA: Diagnosis not present

## 2017-08-16 DIAGNOSIS — J0100 Acute maxillary sinusitis, unspecified: Secondary | ICD-10-CM | POA: Diagnosis not present

## 2017-08-16 DIAGNOSIS — H938X3 Other specified disorders of ear, bilateral: Secondary | ICD-10-CM | POA: Diagnosis not present

## 2017-08-29 DIAGNOSIS — M1811 Unilateral primary osteoarthritis of first carpometacarpal joint, right hand: Secondary | ICD-10-CM | POA: Diagnosis not present

## 2017-10-10 DIAGNOSIS — M2550 Pain in unspecified joint: Secondary | ICD-10-CM | POA: Diagnosis not present

## 2017-10-10 DIAGNOSIS — R768 Other specified abnormal immunological findings in serum: Secondary | ICD-10-CM | POA: Diagnosis not present

## 2017-10-10 DIAGNOSIS — R202 Paresthesia of skin: Secondary | ICD-10-CM | POA: Diagnosis not present

## 2017-10-10 DIAGNOSIS — K5090 Crohn's disease, unspecified, without complications: Secondary | ICD-10-CM | POA: Diagnosis not present

## 2017-11-12 DIAGNOSIS — R0789 Other chest pain: Secondary | ICD-10-CM | POA: Diagnosis not present

## 2017-11-12 DIAGNOSIS — R0602 Shortness of breath: Secondary | ICD-10-CM | POA: Diagnosis not present

## 2017-11-17 DIAGNOSIS — Z85820 Personal history of malignant melanoma of skin: Secondary | ICD-10-CM | POA: Diagnosis not present

## 2017-11-17 DIAGNOSIS — E7800 Pure hypercholesterolemia, unspecified: Secondary | ICD-10-CM | POA: Diagnosis not present

## 2017-11-17 DIAGNOSIS — F411 Generalized anxiety disorder: Secondary | ICD-10-CM | POA: Diagnosis not present

## 2017-11-17 DIAGNOSIS — J449 Chronic obstructive pulmonary disease, unspecified: Secondary | ICD-10-CM | POA: Diagnosis not present

## 2017-11-29 DIAGNOSIS — M1811 Unilateral primary osteoarthritis of first carpometacarpal joint, right hand: Secondary | ICD-10-CM | POA: Diagnosis not present

## 2017-12-16 DIAGNOSIS — R0781 Pleurodynia: Secondary | ICD-10-CM | POA: Diagnosis not present

## 2017-12-16 DIAGNOSIS — R05 Cough: Secondary | ICD-10-CM | POA: Diagnosis not present

## 2017-12-16 DIAGNOSIS — H6121 Impacted cerumen, right ear: Secondary | ICD-10-CM | POA: Diagnosis not present

## 2017-12-20 ENCOUNTER — Telehealth: Payer: Self-pay | Admitting: Internal Medicine

## 2017-12-20 NOTE — Telephone Encounter (Signed)
ATC pt, no answer. Left message for pt to call back.  

## 2017-12-21 NOTE — Telephone Encounter (Signed)
LMTCB

## 2017-12-21 NOTE — Telephone Encounter (Signed)
Spoke with the pt  She states she was recently dxed with PNA by her PCP  She is currently on abx and feeling better  She wants f/u with MR to discuss this and her future need for chest imaging regarding her nodule  OV with MR set for 01/18/18 at 9:45 am  She will call sooner if needed

## 2017-12-21 NOTE — Telephone Encounter (Signed)
Pt is calling back [phone]

## 2017-12-21 NOTE — Telephone Encounter (Signed)
Pt is calling back [phone]

## 2017-12-26 DIAGNOSIS — F411 Generalized anxiety disorder: Secondary | ICD-10-CM | POA: Diagnosis not present

## 2018-01-06 DIAGNOSIS — R05 Cough: Secondary | ICD-10-CM | POA: Diagnosis not present

## 2018-01-18 ENCOUNTER — Ambulatory Visit: Payer: BLUE CROSS/BLUE SHIELD | Admitting: Internal Medicine

## 2018-01-18 ENCOUNTER — Encounter: Payer: Self-pay | Admitting: Internal Medicine

## 2018-01-18 VITALS — BP 108/64 | HR 62 | Ht 66.0 in | Wt 131.8 lb

## 2018-01-18 DIAGNOSIS — R911 Solitary pulmonary nodule: Secondary | ICD-10-CM | POA: Diagnosis not present

## 2018-01-18 DIAGNOSIS — R918 Other nonspecific abnormal finding of lung field: Secondary | ICD-10-CM

## 2018-01-18 DIAGNOSIS — Z8701 Personal history of pneumonia (recurrent): Secondary | ICD-10-CM

## 2018-01-18 NOTE — Progress Notes (Signed)
Subjective:     Patient ID: Jessica Bates, female   DOB: Feb 10, 1961, 57 y.o.   MRN: [id number]  HPI   OV June 4161  57 year old woman, former smoker w/ Crohn disease, HLD presenting for follow up of chronic cough.   Her throat clearing is resolved. She made lifestyle changes to improve her GERD. She is only taking reflux medication as needed.   Had pneumonia in 11/07/2016. She feels that she is not back to baseline. During the pneumonia, she had deep pain with breathing. She was initially treated with ceftriaxone and azithromycin then she was prescribed ciprofloxacin. She did not improve so she was treated with Levaquin a few weeks later. She feels fatigue, dyspnea and chest pressure. The dyspnea and chest pressure is all the time. She has been able to take walks. Feels need to take deep breaths but does not feel that her breathing is labored. She had a sinus infection and the residual chest pressure and was given a course of Levaquin May 25th. She has completed that course. She had some chills at around the time she was given the second course.   FENO 05/31/2016: 16 CXR 05/03/2016: No active cardio-pulmonary process.  CTA chest 11/07/2016: no PE, multifocal bilateral airspace disease worse in lingula and RML, small pleural effusions CT chest w contrast 02/14/2017: mild residual density inferiorly in lingulair segment of LUL and anterior portion or RML, improved from prior exam, 39m subpleural nodule lateral RML   07/13/2017 Follow up OV for cough and dyspnea. -Jessica LisAPP  Jessica MERGENTHALERis a 57y.o. female former smoker ( Quit 2000) with w/ Crohn disease, HLD, asthma and fibromyalgia. She presented all2018 for follow up of chronic cough and dyspnea.  She is followed by Jessica Bates   Patient presents for follow-up.  She is no longer on Humira. She is on Apriso that she does not want to take biologic medications and risk another pneumonia due to being immunocompromised.. She states she  has been doing better, but she was not using her Symbicort twice daily as she should have been. She wants to try using the Symbicort twice daily to see if she has continued improvement.  She went to see cardiology, and is scheduled for 2D echo and cardiopulmonary stress test.  She denies fever, chest pain, orthopnea, or hemoptysis.   Test Results: PFTs>> 06/14/2017>> mild obstruction>> slightly decreased DLCO  FENO>> 06/13/2017>>18 ppb  CXR>> 06/13/2017>> No active cardiopulmonary disease.  12 Lead>> Sinus Bradycardia  CT Chest 05/17/2017>Continued improvement in post inflammatory scarring in the RIGHT middle lobe and lingula. Stable small pulmonary nodule.  FENO 05/31/2016:16 ppb  CXR 05/03/2016:No active cardio-pulmonary process.  CTA chest 11/07/2016: no PE, multifocal bilateral airspace disease worse in lingula and RML, small pleural effusions  CT chest w contrast 02/14/2017: mild residual density inferiorly in lingulair segment of LUL and anterior portion or RML, improved from prior exam, 438msubpleural nodule lateral RML   OV 01/18/2018  Chief Complaint  Patient presents with  . Follow-up    Pt had pna x4 weeks ago and wanted to come see Jessica Bates to make sure that it was indeed pna or if something else could be because of her symptoms. Pt is not currently taking an abx but she is having problems with fatigue.    [age]ear old female with Crohn's disease and previous history of pneumonia used to be on TNF alpha blockade  This is a follow-up made earlier than scheduled.  I last saw  her in September 2018 and my nurse practitioner in October 2018.  Back then on the CT chest the issue to be followed up was a 4 mm right middle lobe nodule.  She had then stopped a TNF alpha blockade because of previous history of pneumonia.  She has now been off TNF alpha blockade since then because she did not want to risk pneumonia by suppressing her immune system.  Then in April 2019 early part after being  exposed to her sick son she developed right infra axillary pain and on chest x-ray was confirmed to have right lower lobe pneumonia by her primary care physician Dr. Hulan Bates.  I personally visualized his chest x-ray and confirmed the findings.  She had a follow-up chest x-ray later in April 2019 that I also personally visualized and this shows resolution.  She is worried about Boop/cop because employee's mother died from the same thing.  She is also worried that her immune system is still suppressed and that is why she got sick with pneumonia.  Currently she feels well.  She does not have any foreign body aspiration or prior history of endobronchial lesions or poor dentition or dysphagia.    has a past medical history of Asthma, Crohn disease ([place]), Depression (meopause), Dyslipidemia, Fibromyalgia, GERD (gastroesophageal reflux disease), Hemorrhoids, Hiatal hernia, Melanoma ([place]), and Pulmonary nodules.   reports that she quit smoking about 19 years ago. Her smoking use included cigarettes. She has a 3.75 pack-year smoking history. She has never used smokeless tobacco.  Past Surgical History:  Procedure Laterality Date  . MELANOMA EXCISION      Allergies  Allergen Reactions  . Cymbalta [Duloxetine Hcl]     Pt unsure   . Penicillins     Rash   . Remicade [Infliximab]     Flushing    Immunization History  Administered Date(s) Administered  . Influenza,inj,Quad PF,6+ Mos 06/14/2015, 06/13/2016  . Influenza-Unspecified 06/12/2017  . Pneumococcal Conjugate-13 12/31/2016  . Pneumococcal Polysaccharide-23 12/31/2016    Family History  Problem Relation Age of Onset  . Leukemia Father   . CAD Father   . Crohn's disease Brother      Current Outpatient Medications:  .  atorvastatin (LIPITOR) 40 MG tablet, Take 40 mg by mouth daily., Disp: , Rfl:  .  cetirizine (ZYRTEC) 10 MG tablet, Take 10 mg by mouth daily., Disp: , Rfl:  .  estradiol (VIVELLE-DOT) 0.075 MG/24HR, Place 1 patch  onto the skin See admin instructions. Sunday Tuesday, Disp: , Rfl:  .  Eszopiclone 3 MG TABS, Take 3 mg by mouth at bedtime. Take immediately before bedtime, Disp: , Rfl:  .  mesalamine (APRISO) 0.375 g 24 hr capsule, Take 375 mg by mouth daily., Disp: , Rfl:  .  methocarbamol (ROBAXIN) 500 MG tablet, TAKE 1 TABLET BY MOUTH TWICE A DAY AS NEEDED MUSCLE TIGHTNESS, Disp: , Rfl: 1 .  montelukast (SINGULAIR) 10 MG tablet, every evening., Disp: , Rfl: 5 .  omeprazole (PRILOSEC) 40 MG capsule, Take 40 mg by mouth 2 (two) times daily., Disp: , Rfl:  .  progesterone (PROMETRIUM) 100 MG capsule, Take 100 mg by mouth daily., Disp: , Rfl:  .  budesonide-formoterol (SYMBICORT) 80-4.5 MCG/ACT inhaler, Inhale 2 puffs into the lungs 2 (two) times daily. (Patient not taking: Reported on 01/18/2018), Disp: 1 Inhaler, Rfl: 0    Review of Systems     Objective:   Physical Exam  Constitutional: She is oriented to person, place, and time. She  appears well-developed and well-nourished. No distress.  HENT:  Head: Normocephalic and atraumatic.  Right Ear: External ear normal.  Left Ear: External ear normal.  Mouth/Throat: Oropharynx is clear and moist. No oropharyngeal exudate.  Eyes: Pupils are equal, round, and reactive to light. Conjunctivae and EOM are normal. Right eye exhibits no discharge. Left eye exhibits no discharge. No scleral icterus.  Neck: Normal range of motion. Neck supple. No JVD present. No tracheal deviation present. No thyromegaly present.  Cardiovascular: Normal rate, regular rhythm, normal heart sounds and intact distal pulses. Exam reveals no gallop and no friction rub.  No murmur heard. Pulmonary/Chest: Effort normal and breath sounds normal. No respiratory distress. She has no wheezes. She has no rales. She exhibits no tenderness.  Abdominal: Soft. Bowel sounds are normal. She exhibits no distension and no mass. There is no tenderness. There is no rebound and no guarding.   Musculoskeletal: Normal range of motion. She exhibits no edema or tenderness.  Lymphadenopathy:    She has no cervical adenopathy.  Neurological: She is alert and oriented to person, place, and time. She has normal reflexes. No cranial nerve deficit. She exhibits normal muscle tone. Coordination normal.  Skin: Skin is warm and dry. No rash noted. She is not diaphoretic. No erythema. No pallor.  Psychiatric: She has a normal mood and affect. Her behavior is normal. Judgment and thought content normal.  Vitals reviewed.  Vitals:   01/18/18 0948  BP: 108/64  Pulse: 62  SpO2: 100%  Weight: 131 lb 12.8 oz (59.8 kg)  Height: 5' 6"  (1.676 m)   Vitals:   01/18/18 0948  BP: 108/64  Pulse: 62  SpO2: 100%  Weight: 131 lb 12.8 oz (59.8 kg)  Height: 5' 6"  (1.676 m)    Estimated body mass index is 21.27 kg/m as calculated from the following:   Height as of this encounter: 5' 6"  (1.676 m).   Weight as of this encounter: 131 lb 12.8 oz (59.8 kg).      Assessment:       ICD-10-CM   1. Nodule of middle lobe of right lung R91.1   2. History of pneumonia Z87.01   3. Abnormal CT scan of lung R91.8        Plan:     Pneumonia - resolved. You got it to due to inherent risk for anyone with sick contact in fall/witner season. Unclear if prior humira has altered your immune system in some way. No evidence of BOOP/COP  Lung nodule right middle lobe 13m - please do CT chest without contras Oct 2019  Followup Oct 2019 after CT   Dr. MBrand Males M.D., FNew York Presbyterian QueensC.P Pulmonary and Critical Care Medicine Staff Physician, [place]Director - Interstitial Lung Disease  Program  [place] [address] Pager: [phone] If no answer or between  15:00h - 7:00h: call [phone] Telephone: [phone]

## 2018-01-18 NOTE — Patient Instructions (Signed)
ICD-10-CM   1. Nodule of middle lobe of right lung R91.1   2. History of pneumonia Z87.01   3. Abnormal CT scan of lung R91.8     Pneumonia - resolved. You got it to due to inherent risk for anyone with sick contact in fall/witner season. Unclear if prior humira has altered your immune system in some way. No evidence of BOOP/COP  Lung nodule right middle lobe 3m - please do CT chest without contras Oct 2019  Followup Oct 2019 after CT

## 2018-02-01 DIAGNOSIS — K5090 Crohn's disease, unspecified, without complications: Secondary | ICD-10-CM | POA: Diagnosis not present

## 2018-02-01 DIAGNOSIS — Z79899 Other long term (current) drug therapy: Secondary | ICD-10-CM | POA: Diagnosis not present

## 2018-02-01 DIAGNOSIS — E7800 Pure hypercholesterolemia, unspecified: Secondary | ICD-10-CM | POA: Diagnosis not present

## 2018-02-23 DIAGNOSIS — M1811 Unilateral primary osteoarthritis of first carpometacarpal joint, right hand: Secondary | ICD-10-CM | POA: Diagnosis not present

## 2018-03-02 IMAGING — CT CT ANGIO CHEST
2 of 6 series · 18 of 36 positions shown · IV contrast (isovue)
Comparison: None.

CLINICAL DATA: Cough and chest pain since last night. Pneumonia
based on chest films earlier today.

EXAM:
CT ANGIOGRAPHY CHEST WITH CONTRAST
TECHNIQUE: Multidetector CT imaging of the chest was performed using the
standard protocol during bolus administration of intravenous
contrast. Multiplanar CT image reconstructions and MIPs were
obtained to evaluate the vascular anatomy.
CONTRAST:  80 cc Isovue 370.

[Series 6: pe thins · axial · 0.59mm/px · z∈[+951,+1173]mm · 17 of 250 slices shown]
[im 14/250  lung]
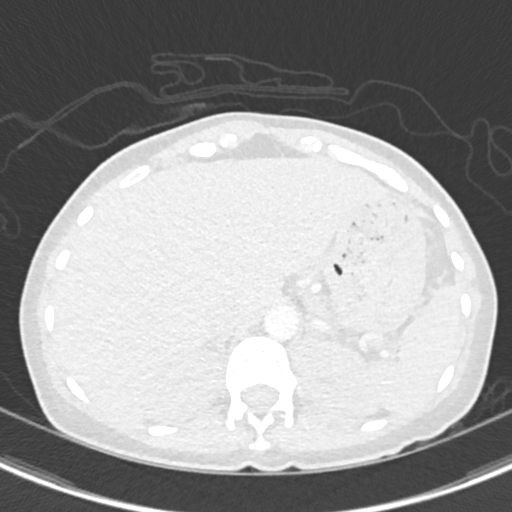
[im 28/250  mediastinal]
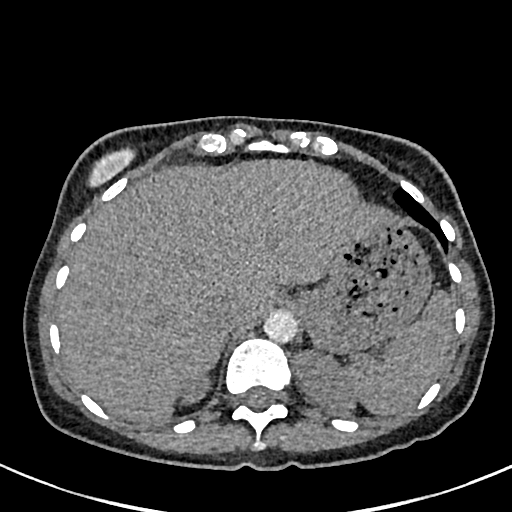
[im 42/250  lung]
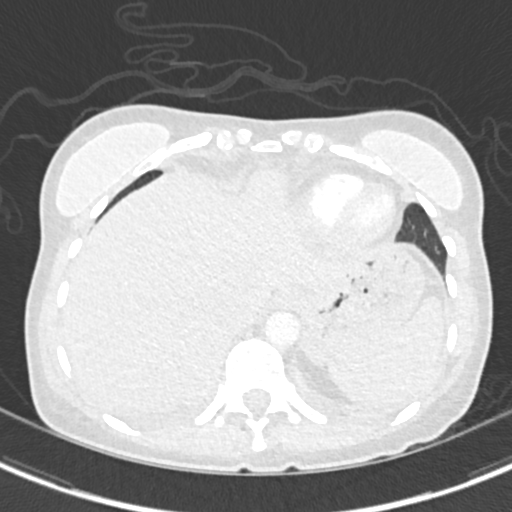
[im 56/250  mediastinal]
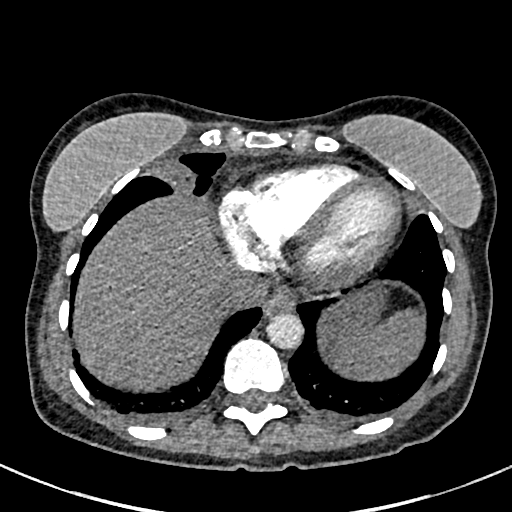
[im 70/250  lung]
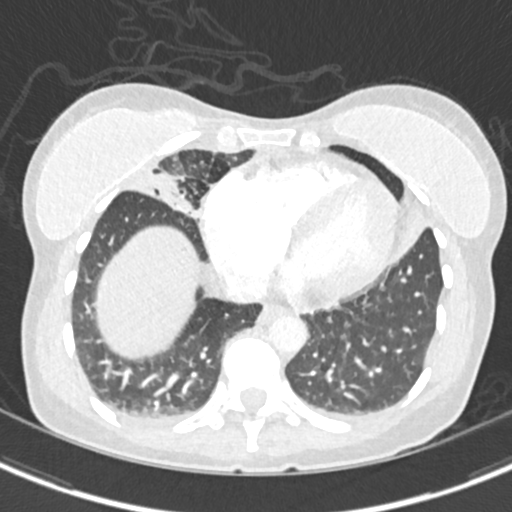
[im 84/250  mediastinal]
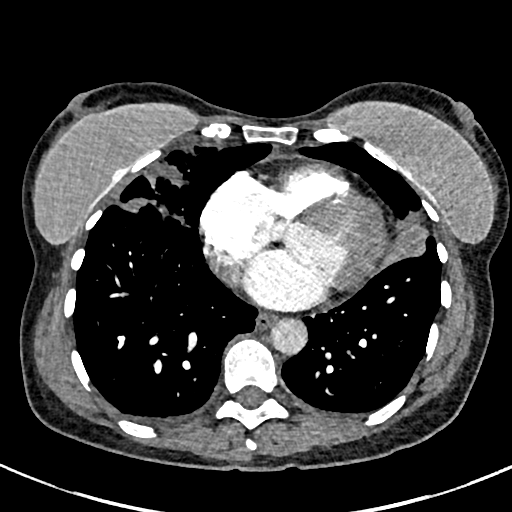
[im 97/250  lung]
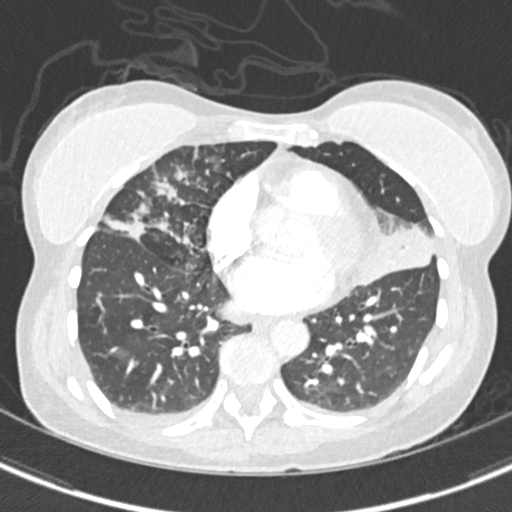
[im 111/250  mediastinal]
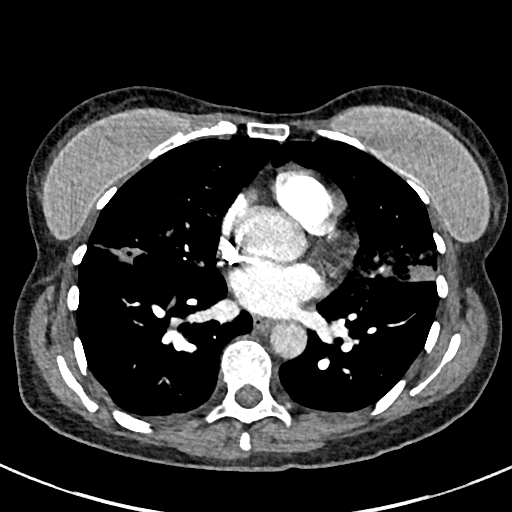
[im 125/250  lung]
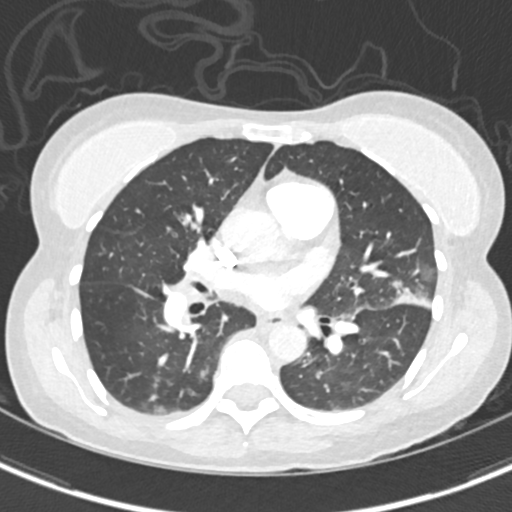
[im 139/250  mediastinal]
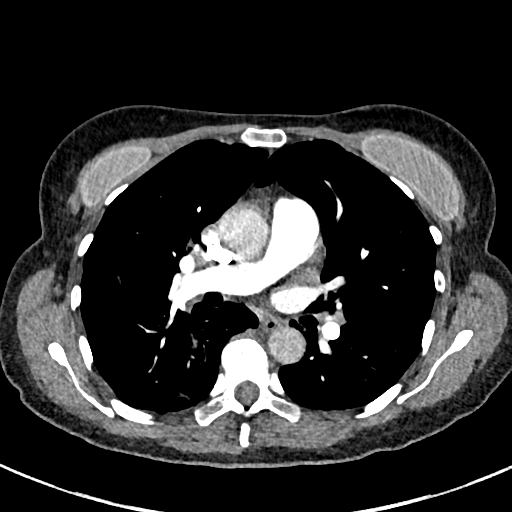
[im 153/250  lung]
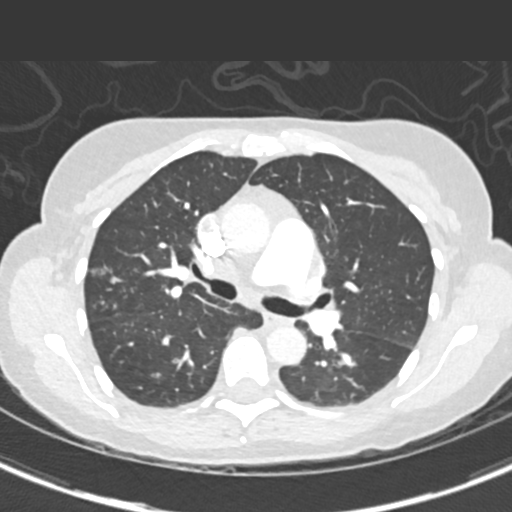
[im 167/250  mediastinal]
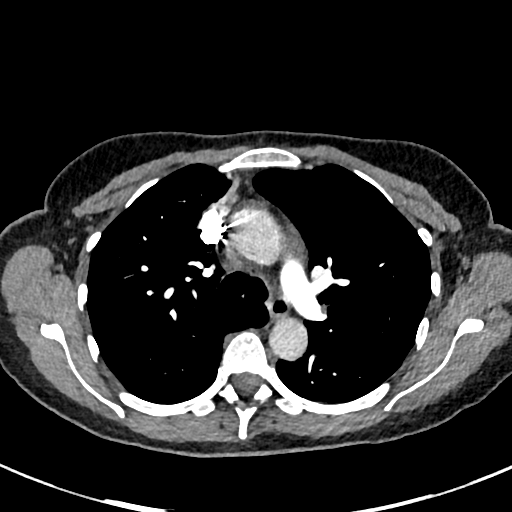
[im 180/250  lung]
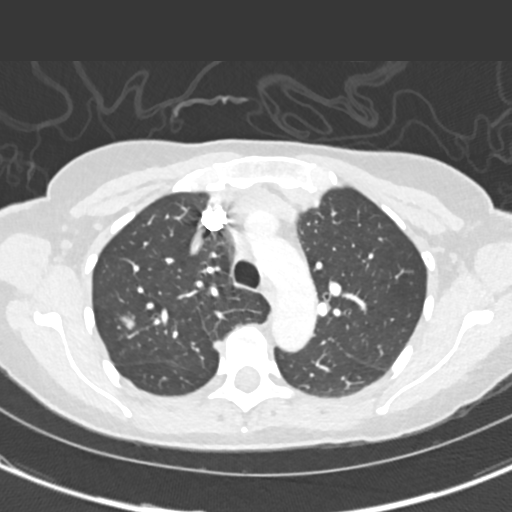
[im 194/250  mediastinal]
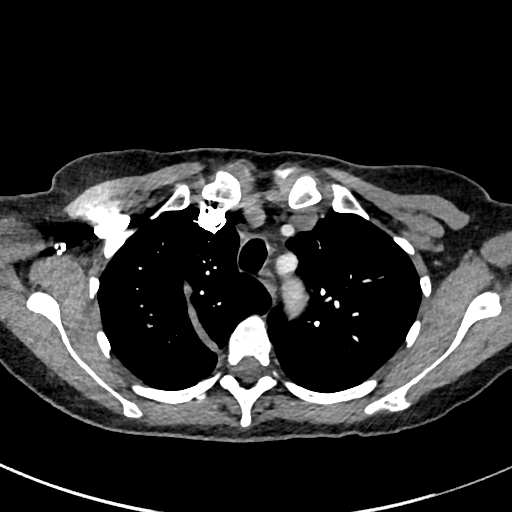
[im 208/250  lung]
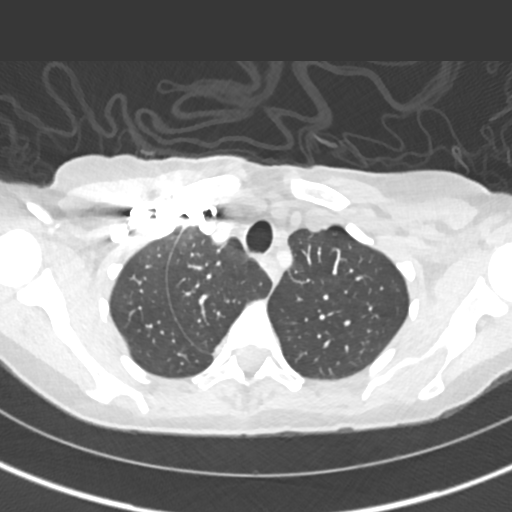
[im 222/250  mediastinal]
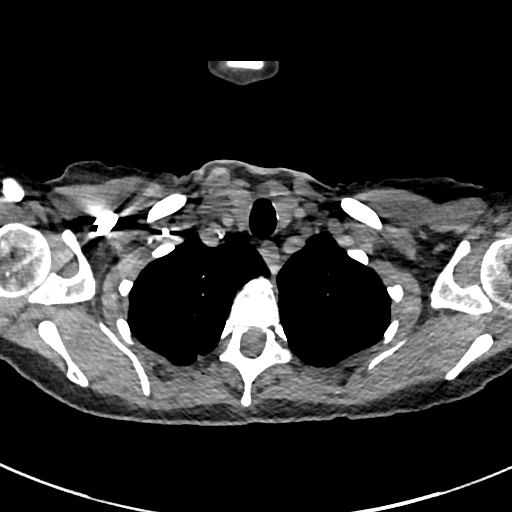
[im 236/250  lung]
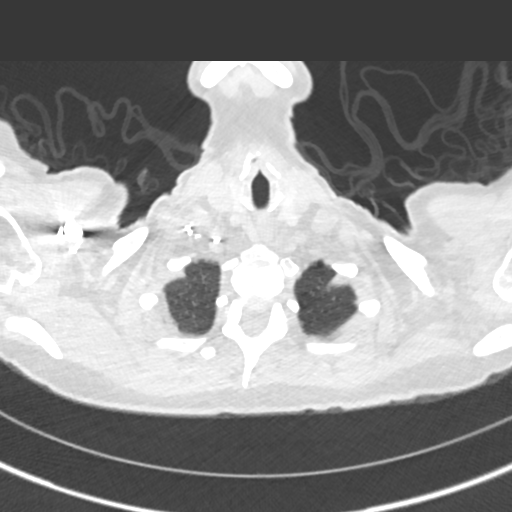

[Series 7: pe 2mm cor · coronal · 0.51mm/px · 1 of 147 slices shown]
[im 74/147  mediastinal]
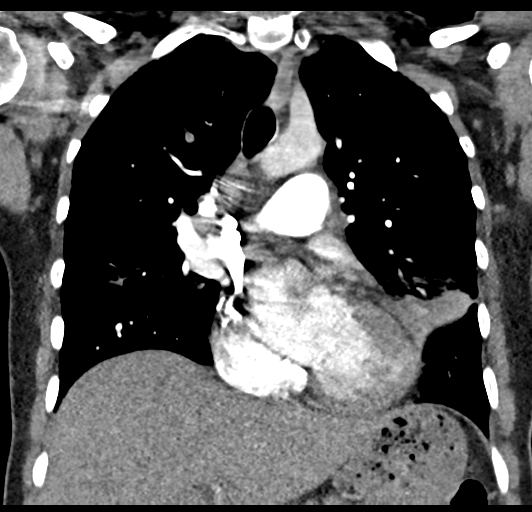

[18 of 36 positions shown; findings below may reference images not displayed]

FINDINGS: Cardiovascular: No pulmonary embolus is identified. No pericardial
effusion. Heart size is mildly enlarged.

Mediastinum/Nodes: Right hilar lymph node on image 54 measures
cm in diameter and is likely reactive. A few additional small
mediastinal lymph nodes are noted. No axillary adenopathy. Thyroid
gland appears normal. No hiatal hernia.

Lungs/Pleura: Small pleural effusion is identified. The patient has
airspace disease in both the right middle lobe and lingula with an
appearance most consistent with pneumonia. Mild patchy airspace
opacity is also seen in the superior segments of the lower lobes
bilaterally, worse on the right.

Upper Abdomen: No acute abnormality.

Musculoskeletal: Bilateral breast implants are noted. No focal bony
abnormality.

Review of the MIP images confirms the above findings.
IMPRESSION: Negative for pulmonary embolus.

Multifocal bilateral airspace disease as described above and small
pleural effusions consistent with pneumonia. Airspace disease is
worst in the lingula and right middle lobe.

## 2018-03-06 DIAGNOSIS — K5090 Crohn's disease, unspecified, without complications: Secondary | ICD-10-CM | POA: Diagnosis not present

## 2018-04-26 DIAGNOSIS — J3489 Other specified disorders of nose and nasal sinuses: Secondary | ICD-10-CM | POA: Diagnosis not present

## 2018-04-26 DIAGNOSIS — L821 Other seborrheic keratosis: Secondary | ICD-10-CM | POA: Diagnosis not present

## 2018-04-26 DIAGNOSIS — D225 Melanocytic nevi of trunk: Secondary | ICD-10-CM | POA: Diagnosis not present

## 2018-04-26 DIAGNOSIS — D1800 Hemangioma unspecified site: Secondary | ICD-10-CM | POA: Diagnosis not present

## 2018-04-26 DIAGNOSIS — L814 Other melanin hyperpigmentation: Secondary | ICD-10-CM | POA: Diagnosis not present

## 2018-06-15 DIAGNOSIS — M1811 Unilateral primary osteoarthritis of first carpometacarpal joint, right hand: Secondary | ICD-10-CM | POA: Diagnosis not present

## 2018-06-19 DIAGNOSIS — F411 Generalized anxiety disorder: Secondary | ICD-10-CM | POA: Diagnosis not present

## 2018-06-21 ENCOUNTER — Ambulatory Visit (INDEPENDENT_AMBULATORY_CARE_PROVIDER_SITE_OTHER)
Admission: RE | Admit: 2018-06-21 | Discharge: 2018-06-21 | Disposition: A | Discharge status: Home / Self Care | Payer: BLUE CROSS/BLUE SHIELD | Source: Ambulatory Visit | Attending: Internal Medicine | Admitting: Internal Medicine

## 2018-06-21 DIAGNOSIS — R911 Solitary pulmonary nodule: Secondary | ICD-10-CM

## 2018-06-21 DIAGNOSIS — J449 Chronic obstructive pulmonary disease, unspecified: Secondary | ICD-10-CM | POA: Diagnosis not present

## 2018-06-21 DIAGNOSIS — K5090 Crohn's disease, unspecified, without complications: Secondary | ICD-10-CM | POA: Diagnosis not present

## 2018-06-21 DIAGNOSIS — R918 Other nonspecific abnormal finding of lung field: Secondary | ICD-10-CM | POA: Diagnosis not present

## 2018-06-21 DIAGNOSIS — M797 Fibromyalgia: Secondary | ICD-10-CM | POA: Diagnosis not present

## 2018-06-21 DIAGNOSIS — Z0000 Encounter for general adult medical examination without abnormal findings: Secondary | ICD-10-CM | POA: Diagnosis not present

## 2018-07-06 ENCOUNTER — Telehealth: Payer: Self-pay | Admitting: Internal Medicine

## 2018-07-06 NOTE — Telephone Encounter (Signed)
Attempted to call pt but no answer.left message for pt to return call x1

## 2018-07-06 NOTE — Telephone Encounter (Signed)
Spoke with the pt  She is requesting CT Chest results from 06/21/18  Please advise, thanks!

## 2018-07-06 NOTE — Telephone Encounter (Signed)
See below Plan - ROV per recent OV   Thanks    SIGNATURE    Dr. Brand Males, M.D., F.C.C.P,  Pulmonary and Critical Care Medicine Staff Physician, [place] Director - Interstitial Lung Disease  Program  Pulmonary [place] at [place], [place], [address]  Pager: [phone], If no answer or between  15:00h - 7:00h: call [phone] Telephone: [phone]  2:19 PM 07/06/2018     IMPRESSION: 1. Stable 4 mm right middle lobe subpleural nodule in the 1 year interval since prior study. This can be considered benign process and is likely a subpleural lymph node. 2. Similar appearance of mild right middle lobe and lingular bronchiectasis and small airway impaction. Similar trace findings in both lower lobes also show no substantial change. 3.  Aortic Atherosclerois (ICD10-170.0)   Electronically Signed   By: Misty Stanley M.D.   On: 06/21/2018 11:04

## 2018-07-07 NOTE — Telephone Encounter (Signed)
Attempted to contact pt. I did not receive an answer. I have left a message for pt to return our call.  

## 2018-07-11 ENCOUNTER — Ambulatory Visit (INDEPENDENT_AMBULATORY_CARE_PROVIDER_SITE_OTHER): Payer: BLUE CROSS/BLUE SHIELD | Admitting: Primary Care

## 2018-07-11 ENCOUNTER — Encounter: Payer: Self-pay | Admitting: Primary Care

## 2018-07-11 DIAGNOSIS — R918 Other nonspecific abnormal finding of lung field: Secondary | ICD-10-CM

## 2018-07-11 DIAGNOSIS — J479 Bronchiectasis, uncomplicated: Secondary | ICD-10-CM | POA: Insufficient documentation

## 2018-07-11 DIAGNOSIS — R911 Solitary pulmonary nodule: Secondary | ICD-10-CM

## 2018-07-11 MED ORDER — FLUTTER DEVI: 0 refills | Status: AC

## 2018-07-11 NOTE — Telephone Encounter (Signed)
Called and spoke with pt letting her know the results of the CT scan. Stated to pt that MR would like her to come in for an appt to discuss results in more detail.  Pt expressed understanding. I scheduled pt an appt with Derl Barrow, NP today, 10/29 at 4:30. Nothing further needed.

## 2018-07-11 NOTE — Patient Instructions (Addendum)
CT Chest showed stable 33m subpleural nodule right middle lobe, likely benign and representing lymph node. Mild bronchiectasis and small airway impaction right middle lobe   Flutter valve three times a day for congestion Mucinex twice daily for chest congestion   Follow up in 6 months with Dr. RChase Caller   Needs CT chest w/o contrast in 1 year re: follow pulmonary nodule    Bronchiectasis Bronchiectasis is a condition in which the airways (bronchi) are damaged and widened. This makes it difficult for the lungs to get rid of mucus. As a result, mucus gathers in the airways, and this often leads to lung infections. Infection can cause inflammation in the airways, which may further weaken and damage the bronchi. What are the causes? Bronchiectasis may be present at birth (congenital) or may develop later in life. Sometimes there is no apparent cause. Some common causes include:  Breathing in fluid, food, or other foreign objects (aspiration).  Recurrent lung infections (such as pneumonia, tuberculosis, or fungal infections).  Foreign bodies or other blockages in the lungs.  Cystic fibrosis   What are the signs or symptoms? Common symptoms include:  A daily cough that brings up mucus and lasts for more than 3 weeks.  Frequent lung infections (such as pneumonia, tuberculosis, or fungal infections).  Shortness of breath and wheezing.  Weakness and fatigue.  How is this diagnosed? Various tests may be done to help diagnose bronchiectasis. Tests may include:  Chest X-rays or CT scans.  Breathing tests to help determine how your lungs are working.  Sputum cultures to check for infection.  Blood tests and other tests to check for related diseases or causes, such as cystic fibrosis.  How is this treated? Treatment varies depending on the severity of the condition. Medicines may be given to loosen the mucus to be coughed up (expectorants), to relax the muscles of the air passages  (bronchodilators), or to prevent or treat infections (antibiotics). Physical therapy methods may be recommended to help clear mucus from the lungs. For severe cases, surgery may be done to remove the affected part of the lung. Follow these instructions at home:  Get plenty of rest.  Only take over-the-counter or prescription medicines as directed by your health care provider. If antibiotic medicines were prescribed, take them as directed. Finish them even if you start to feel better.  Avoid sedatives and antihistamines unless otherwise directed by your health care provider. These medicines tend to thicken the mucus in the lungs.  Perform any breathing exercises or techniques to clear the lungs as directed by your health care provider.  Drink enough fluids to keep your urine clear or pale yellow.  Consider using a cold steam vaporizer or humidifier in your room or home to help loosen secretions.  If the cough is worse at night, try sleeping in a semi-upright position in a recliner or using a couple of pillows.  Avoid cigarette smoke and lung irritants. If you smoke, quit.  Stay inside when pollution and ozone levels are high.  Stay current with vaccinations and immunizations.  Follow up with your health care provider as directed. Contact a health care provider if:  You cough up more thick, discolored mucus (sputum) that is yellow to green in color.  You have a fever or persistent symptoms for more than 2-3 days.  You cannot control your cough and are losing sleep. Get help right away if:  You cough up blood.  You have chest pain or increasing shortness of  breath.  You have pain that is getting worse or is uncontrolled with medicines.  You have a fever and your symptoms suddenly get worse. This information is not intended to replace advice given to you by your health care provider. Make sure you discuss any questions you have with your health care provider. Document Released:  06/27/2007 Document Revised: 02/11/2016 Document Reviewed: 03/07/2013 Elsevier Interactive Patient Education  2017 [employer].

## 2018-07-11 NOTE — Assessment & Plan Note (Signed)
-   Mild right middle lobe and lingular bronchiectasis and small airway impaction - Recommend flutter valve TID and mucinex BID prn congestion

## 2018-07-11 NOTE — Progress Notes (Signed)
@Patient  ID: Jessica Bates, female    DOB: 20-Oct-1960, 57 y.o.   MRN: [id number]  Chief Complaint  Patient presents with  . Follow-up    Referring provider: Hulan Fess, MD  HPI:  57 year old female, former smoker quit 2000. PMH significant for Asthma, Crohn disease, Depression (meopause), Dyslipidemia, Fibromyalgia, GERD, Hemorrhoids, Hiatal hernia, Melanoma, and Pulmonary nodules. Previous history of pneumonia used to be on TNF alpha blockade. Patient of Dr. Chase Caller, last seen on 01/18/18.   01/18/18- OV/ Dr.Ramaswamy  This is a follow-up made earlier than scheduled.  I last saw her in September 2018 and my nurse practitioner in October 2018.  Back then on the CT chest the issue to be followed up was a 4 mm right middle lobe nodule.  She had then stopped a TNF alpha blockade because of previous history of pneumonia.  She has now been off TNF alpha blockade since then because she did not want to risk pneumonia by suppressing her immune system.  Then in April 2019 early part after being exposed to her sick son she developed right infra axillary pain and on chest x-ray was confirmed to have right lower lobe pneumonia by her primary care physician Dr. Hulan Fess.  I personally visualized his chest x-ray and confirmed the findings.  She had a follow-up chest x-ray later in April 2019 that I also personally visualized and this shows resolution.  She is worried about Boop/cop because employee's mother died from the same thing.  She is also worried that her immune system is still suppressed and that is why she got sick with pneumonia.  Currently she feels well.  She does not have any foreign body aspiration or prior history of endobronchial lesions or poor dentition or dysphagia.   07/11/2018 Patient presents today to review recent Chest CT results. Feels well. Breathing is fine, occasional chest heaviness and cough. CT chest in October showed stable 4 mm right middle lobe subpleural nodule.  Mild right middle lobe and lingular bronchiectasis and small airway impaction. Denies wheezing, sob, hemoptysis. Afebrile.   Reports that she quit smoking about 19 years ago. Her smoking use included cigarettes. She has a 9 pack year smoking history. States that she smoked a pack per day from age [age]. And then occasional cigarette until she quit in 2000. She has never used smokeless tobacco.  Imaging: CTA chest 11/07/2016: no PE, multifocal bilateral airspace disease worse in lingula and RML, small pleural effusions CT chest w/contrast 02/14/2017: mild residual density inferiorly in lingulair segment of LUL and anterior portion or RML, improved from prior exam, 70m subpleural nodule lateral RML CT chest 06/21/18- Stable 4 mm right middle lobe subpleural nodule. Similar appearance of mild right middle lobe and lingular bronchiectasis and small airway impaction   Allergies  Allergen Reactions  . Cymbalta [Duloxetine Hcl]     Pt unsure   . Penicillins     Rash   . Remicade [Infliximab]     Flushing    Immunization History  Administered Date(s) Administered  . Influenza,inj,Quad PF,6+ Mos 06/14/2015, 06/13/2016  . Influenza-Unspecified 06/12/2017  . Pneumococcal Conjugate-13 12/31/2016  . Pneumococcal Polysaccharide-23 12/31/2016    Past Medical History:  Diagnosis Date  . Asthma   . [place]   . Depression meopause  . Dyslipidemia   . Fibromyalgia   . GERD (gastroesophageal reflux disease)   . Hemorrhoids   . Hiatal hernia   . [place]   . Pulmonary nodules  Tobacco History: Social History   Tobacco Use  Smoking Status Former Smoker  . Packs/day: 1.00  . Years: 9.00  . Pack years: 9.00  . Types: Cigarettes  . Last attempt to quit: 09/13/1998  . Years since quitting: 19.8  Smokeless Tobacco Never Used   Counseling given: Not Answered   Outpatient Medications Prior to Visit  Medication Sig Dispense Refill  . atorvastatin (LIPITOR) 40 MG tablet  Take 40 mg by mouth daily.    . budesonide-formoterol (SYMBICORT) 80-4.5 MCG/ACT inhaler Inhale 2 puffs into the lungs 2 (two) times daily. 1 Inhaler 0  . cetirizine (ZYRTEC) 10 MG tablet Take 10 mg by mouth daily.    Marland Kitchen estradiol (VIVELLE-DOT) 0.075 MG/24HR Place 1 patch onto the skin See admin instructions. Sunday Tuesday    . Eszopiclone 3 MG TABS Take 3 mg by mouth at bedtime. Take immediately before bedtime    . mesalamine (APRISO) 0.375 g 24 hr capsule Take 375 mg by mouth daily.    . methocarbamol (ROBAXIN) 500 MG tablet TAKE 1 TABLET BY MOUTH TWICE A DAY AS NEEDED MUSCLE TIGHTNESS  1  . montelukast (SINGULAIR) 10 MG tablet every evening.  5  . omeprazole (PRILOSEC) 40 MG capsule Take 40 mg by mouth 2 (two) times daily.    . progesterone (PROMETRIUM) 100 MG capsule Take 100 mg by mouth daily.     No facility-administered medications prior to visit.     Review of Systems  Review of Systems  Constitutional: Negative.   HENT: Negative.   Respiratory: Positive for cough. Negative for shortness of breath and wheezing.   Cardiovascular: Negative.     Physical Exam  BP 118/66 (BP Location: Left Arm, Cuff Size: Normal)   Pulse 66   Temp (!) 97.5 F (36.4 C)   Ht 5' 6"  (1.676 m)   Wt 133 lb 9.6 oz (60.6 kg)   LMP 02/14/2014 Comment: bcp//A.C.  SpO2 100%   BMI 21.56 kg/m  Physical Exam  Constitutional: She is oriented to person, place, and time. She appears well-developed and well-nourished. No distress.  HENT:  Head: Normocephalic and atraumatic.  Eyes: Pupils are equal, round, and reactive to light. EOM are normal.  Neck: Normal range of motion. Neck supple.  Cardiovascular: Normal rate and regular rhythm.  Pulmonary/Chest: Effort normal. No respiratory distress. She has no wheezes.  Neurological: She is alert and oriented to person, place, and time.  Skin: Skin is warm and dry.  Psychiatric: She has a normal mood and affect. Her behavior is normal. Judgment and thought  content normal.     Lab Results:  CBC    Component Value Date/Time   WBC 13.3 (H) 11/07/2016 1157   RBC 3.82 (L) 11/07/2016 1157   HGB 11.7 (L) 11/07/2016 1157   HCT 36.1 11/07/2016 1157   PLT 425 (H) 11/07/2016 1157   MCV 94.5 11/07/2016 1157   MCH 30.6 11/07/2016 1157   MCHC 32.4 11/07/2016 1157   RDW 12.2 11/07/2016 1157   LYMPHSABS 2.5 11/07/2016 1157   MONOABS 0.9 11/07/2016 1157   EOSABS 0.0 11/07/2016 1157   BASOSABS 0.0 11/07/2016 1157    BMET    Component Value Date/Time   NA 135 05/13/2017 1209   K 3.8 05/13/2017 1209   CL 101 05/13/2017 1209   CO2 27 05/13/2017 1209   GLUCOSE 94 05/13/2017 1209   BUN 12 05/13/2017 1209   CREATININE 0.73 05/13/2017 1209   CALCIUM 9.3 05/13/2017 1209   GFRNONAA >60  11/07/2016 1157   GFRAA >60 11/07/2016 1157    BNP No results found for: BNP  ProBNP No results found for: PROBNP  Imaging: Ct Chest Wo Contrast  Result Date: 06/21/2018 CLINICAL DATA:  Right middle lobe lung nodule.  Follow-up. EXAM: CT CHEST WITHOUT CONTRAST TECHNIQUE: Multidetector CT imaging of the chest was performed following the standard protocol without IV contrast. COMPARISON:  05/17/2017 FINDINGS: Cardiovascular: The heart size is normal. No substantial pericardial effusion. Coronary artery calcification is evident. Atherosclerotic calcification is noted in the wall of the thoracic aorta. Mediastinum/Nodes: No mediastinal lymphadenopathy. No evidence for gross hilar lymphadenopathy although assessment is limited by the lack of intravenous contrast on today's study. The esophagus has normal imaging features. There is no axillary lymphadenopathy. Lungs/Pleura: The central tracheobronchial airways are patent. Trace bronchiectasis with small airway impaction noted right middle lobe and lingula, similar to prior. The subpleural peripheral right middle lobe nodule measured previously at 4 mm is stable at 4 mm today. There is some trace bronchiectasis and small  airway impaction again noted in the lower lobes. No new suspicious pulmonary nodule or mass. No pleural effusion. Upper Abdomen: Unremarkable. Musculoskeletal: No worrisome lytic or sclerotic osseous abnormality. IMPRESSION: 1. Stable 4 mm right middle lobe subpleural nodule in the 1 year interval since prior study. This can be considered benign process and is likely a subpleural lymph node. 2. Similar appearance of mild right middle lobe and lingular bronchiectasis and small airway impaction. Similar trace findings in both lower lobes also show no substantial change. 3.  Aortic Atherosclerois (ICD10-170.0) Electronically Signed   By: Misty Stanley M.D.   On: 06/21/2018 11:04     Assessment & Plan:   Bronchiectasis without complication (HCC) - Mild right middle lobe and lingular bronchiectasis and small airway impaction - Recommend flutter valve TID and mucinex BID prn congestion   Pulmonary nodule, right - Single 40m right middle lobe subpleural nodule, stable in size and appearance since prior study- considered benign process and is likely subpleural lypmh node - Follow up in 1 year, if stable no further imaging warranted    EMartyn Ehrich NP 07/11/2018

## 2018-07-11 NOTE — Assessment & Plan Note (Addendum)
-   Single 73m right middle lobe subpleural nodule, stable in size and appearance since prior study- considered benign process and is likely subpleural lypmh node - Follow up in 1 year, if stable no further imaging warranted

## 2018-07-19 ENCOUNTER — Encounter: Payer: Self-pay | Admitting: Adult Health

## 2018-07-19 ENCOUNTER — Ambulatory Visit: Payer: BLUE CROSS/BLUE SHIELD | Admitting: Adult Health

## 2018-07-19 ENCOUNTER — Ambulatory Visit (INDEPENDENT_AMBULATORY_CARE_PROVIDER_SITE_OTHER)
Admission: RE | Admit: 2018-07-19 | Discharge: 2018-07-19 | Disposition: A | Discharge status: Home / Self Care | Payer: BLUE CROSS/BLUE SHIELD | Source: Ambulatory Visit | Attending: Adult Health | Admitting: Adult Health

## 2018-07-19 VITALS — BP 114/66 | HR 71 | Ht 66.0 in | Wt 134.4 lb

## 2018-07-19 DIAGNOSIS — J479 Bronchiectasis, uncomplicated: Secondary | ICD-10-CM

## 2018-07-19 DIAGNOSIS — R0602 Shortness of breath: Secondary | ICD-10-CM | POA: Diagnosis not present

## 2018-07-19 MED ORDER — BUDESONIDE-FORMOTEROL FUMARATE 80-4.5 MCG/ACT IN AERO: 2.0000 | INHALATION_SPRAY | Freq: Two times a day (BID) | RESPIRATORY_TRACT | 3 refills | Status: DC

## 2018-07-19 NOTE — Progress Notes (Signed)
Called spoke with patient, advised of cxr results / recs as stated by TP.  Pt verbalized understanding and denied any questions.

## 2018-07-19 NOTE — Progress Notes (Signed)
@Patient  ID: Jessica Bates, female    DOB: 1961/07/28, 57 y.o.   MRN: [id number]  Chief Complaint  Patient presents with  . Acute Visit    Referring provider: Hulan Fess, MD  HPI: 57 year old female former smoker followed for chronic cough bronchiectasis Medical history significant for Crohn's disease and  Raynauds syndrome .   TEST  PFTs>> 06/14/2017>> mild obstruction>> slightly decreased DLCO  FENO>> 06/13/2017>>18 ppb  CXR>>06/13/2017>>No active cardiopulmonary disease.   CT Chest 05/17/2017>Continued improvement in post inflammatory scarring in the RIGHT middle lobe and lingula. Stable small pulmonary nodule.  CT chest October 2019 showed stable 4 mm right middle lobe nodule.  Similar appearance of mild right middle lobe and lingular bronchiectasis and small airway impaction.  07/19/2018 Acute OV : Cough  Patient presents for an acute office visit.  Complains over the last 5 days that she had tightness and wheezing.  She is been having a lot of nasal congestion stuffiness and drainage.  Cough is minimally productive with no discolored mucus.  No fever.  Feels that symptoms began when the weather changed with a cold temperatures outside.  She denies any hemoptysis chest pain orthopnea PND or increased leg swelling. Appetite is good with no nausea vomiting diarrhea. She has flutter valve and is been using.  Allergies  Allergen Reactions  . Cymbalta [Duloxetine Hcl]     Pt unsure   . Penicillins     Rash   . Remicade [Infliximab]     Flushing    Immunization History  Administered Date(s) Administered  . Influenza Split 06/13/2018  . Influenza,inj,Quad PF,6+ Mos 06/14/2015, 06/13/2016  . Influenza-Unspecified 06/12/2017  . Pneumococcal Conjugate-13 12/31/2016  . Pneumococcal Polysaccharide-23 12/31/2016    Past Medical History:  Diagnosis Date  . Asthma   . Crohn disease ([place])   . Depression meopause  . Dyslipidemia   . Fibromyalgia   . GERD  (gastroesophageal reflux disease)   . Hemorrhoids   . Hiatal hernia   . Melanoma ([place])   . Pulmonary nodules     Tobacco History: Social History   Tobacco Use  Smoking Status Former Smoker  . Packs/day: 1.00  . Years: 9.00  . Pack years: 9.00  . Types: Cigarettes  . Last attempt to quit: 09/13/1998  . Years since quitting: 19.8  Smokeless Tobacco Never Used   Counseling given: Not Answered   Outpatient Medications Prior to Visit  Medication Sig Dispense Refill  . atorvastatin (LIPITOR) 40 MG tablet Take 40 mg by mouth daily.    . budesonide-formoterol (SYMBICORT) 80-4.5 MCG/ACT inhaler Inhale 2 puffs into the lungs 2 (two) times daily. 1 Inhaler 0  . cetirizine (ZYRTEC) 10 MG tablet Take 10 mg by mouth daily.    Marland Kitchen estradiol (VIVELLE-DOT) 0.075 MG/24HR Place 1 patch onto the skin See admin instructions. Sunday Tuesday    . Eszopiclone 3 MG TABS Take 3 mg by mouth at bedtime. Take immediately before bedtime    . mesalamine (APRISO) 0.375 g 24 hr capsule Take 375 mg by mouth daily.    . methocarbamol (ROBAXIN) 500 MG tablet TAKE 1 TABLET BY MOUTH TWICE A DAY AS NEEDED MUSCLE TIGHTNESS  1  . montelukast (SINGULAIR) 10 MG tablet every evening.  5  . omeprazole (PRILOSEC) 40 MG capsule Take 40 mg by mouth 2 (two) times daily.    . progesterone (PROMETRIUM) 100 MG capsule Take 100 mg by mouth daily.    Marland Kitchen Respiratory Therapy Supplies (FLUTTER) DEVI Use flutter valve  3 times a day 1 each 0   No facility-administered medications prior to visit.      Review of Systems  Constitutional:   No  weight loss, night sweats,  Fevers, chills,  +fatigue, or  lassitude.  HEENT:   No headaches,  Difficulty swallowing,  Tooth/dental problems, or  Sore throat,                No sneezing, itching, ear ache,  +nasal congestion, post nasal drip,   CV:  No chest pain,  Orthopnea, PND, swelling in lower extremities, anasarca, dizziness, palpitations, syncope.   GI  No heartburn, indigestion,  abdominal pain, nausea, vomiting, diarrhea, change in bowel habits, loss of appetite, bloody stools.   Resp: .  No chest wall deformity  Skin: no rash or lesions.  GU: no dysuria, change in color of urine, no urgency or frequency.  No flank pain, no hematuria   MS:  No joint pain or swelling.  No decreased range of motion.  No back pain.    Physical Exam  BP 114/66 (BP Location: Left Arm, Cuff Size: Normal)   Pulse 71   Ht 5' 6"  (1.676 m)   Wt 134 lb 6.4 oz (61 kg)   LMP 02/14/2014 Comment: bcp//A.C.  SpO2 99%   BMI 21.69 kg/m   GEN: A/Ox3; pleasant , NAD, well nourished    HEENT:  [place]/AT,  EACs-clear, TMs-wnl, NOSE-clear, THROAT-clear, no lesions, no postnasal drip or exudate noted.   NECK:  Supple w/ fair ROM; no JVD; normal carotid impulses w/o bruits; no thyromegaly or nodules palpated; no lymphadenopathy.    RESP  Clear  P & A; w/o, wheezes/ rales/ or rhonchi. no accessory muscle use, no dullness to percussion  CARD:  RRR, no m/r/g, no peripheral edema, pulses intact, no cyanosis or clubbing.  GI:   Soft & nt; nml bowel sounds; no organomegaly or masses detected.   Musco: Warm bil, no deformities or joint swelling noted.   Neuro: alert, no focal deficits noted.    Skin: Warm, no lesions or rashes    Lab Results:  CBC  BNP No results found for: BNP  ProBNP No results found for: PROBNP  Imaging: Ct Chest Wo Contrast  Result Date: 06/21/2018 CLINICAL DATA:  Right middle lobe lung nodule.  Follow-up. EXAM: CT CHEST WITHOUT CONTRAST TECHNIQUE: Multidetector CT imaging of the chest was performed following the standard protocol without IV contrast. COMPARISON:  05/17/2017 FINDINGS: Cardiovascular: The heart size is normal. No substantial pericardial effusion. Coronary artery calcification is evident. Atherosclerotic calcification is noted in the wall of the thoracic aorta. Mediastinum/Nodes: No mediastinal lymphadenopathy. No evidence for gross hilar  lymphadenopathy although assessment is limited by the lack of intravenous contrast on today's study. The esophagus has normal imaging features. There is no axillary lymphadenopathy. Lungs/Pleura: The central tracheobronchial airways are patent. Trace bronchiectasis with small airway impaction noted right middle lobe and lingula, similar to prior. The subpleural peripheral right middle lobe nodule measured previously at 4 mm is stable at 4 mm today. There is some trace bronchiectasis and small airway impaction again noted in the lower lobes. No new suspicious pulmonary nodule or mass. No pleural effusion. Upper Abdomen: Unremarkable. Musculoskeletal: No worrisome lytic or sclerotic osseous abnormality. IMPRESSION: 1. Stable 4 mm right middle lobe subpleural nodule in the 1 year interval since prior study. This can be considered benign process and is likely a subpleural lymph node. 2. Similar appearance of mild right middle lobe and lingular  bronchiectasis and small airway impaction. Similar trace findings in both lower lobes also show no substantial change. 3.  Aortic Atherosclerois (ICD10-170.0) Electronically Signed   By: Misty Stanley M.D.   On: 06/21/2018 11:04      PFT Results Latest Ref Rng & Units 06/14/2017  FVC-Pre L 3.15  FVC-Predicted Pre % 88  FVC-Post L 3.14  FVC-Predicted Post % 88  Pre FEV1/FVC % % 72  Post FEV1/FCV % % 77  FEV1-Pre L 2.27  FEV1-Predicted Pre % 82  DLCO UNC% % 65  DLCO COR %Predicted % 73  TLC L 5.02  TLC % Predicted % 96  RV % Predicted % 103    Lab Results  Component Value Date   NITRICOXIDE 18 06/13/2017        Assessment & Plan:   No problem-specific Assessment & Plan notes found for this encounter.     Rexene Edison, NP 07/19/2018

## 2018-07-19 NOTE — Patient Instructions (Signed)
Begin Symbicort 80 2 puffs twice daily, rinse after use Change Zyrtec to Allegra 180 mg daily Begin Flonase 2 puffs daily Saline nasal rinses Mucinex DM twice daily as needed for cough and congestion Use flutter valve 3 times daily Fluids rest Tylenol as needed Chest x-ray today Follow up with Dr. Chase Caller or APP in 2-3 weeks and As needed   Please contact office for sooner follow up if symptoms do not improve or worsen or seek emergency care

## 2018-07-20 DIAGNOSIS — Z6821 Body mass index (BMI) 21.0-21.9, adult: Secondary | ICD-10-CM | POA: Diagnosis not present

## 2018-07-20 DIAGNOSIS — Z01419 Encounter for gynecological examination (general) (routine) without abnormal findings: Secondary | ICD-10-CM | POA: Diagnosis not present

## 2018-07-20 DIAGNOSIS — Z1231 Encounter for screening mammogram for malignant neoplasm of breast: Secondary | ICD-10-CM | POA: Diagnosis not present

## 2018-07-20 NOTE — Assessment & Plan Note (Signed)
Mild flare with URI.  Check chest x-ray hold on antibiotics at this time increase pulmonary hygiene regimen  Plan  Patient Instructions  Begin Symbicort 80 2 puffs twice daily, rinse after use Change Zyrtec to Allegra 180 mg daily Begin Flonase 2 puffs daily Saline nasal rinses Mucinex DM twice daily as needed for cough and congestion Use flutter valve 3 times daily Fluids rest Tylenol as needed Chest x-ray today Follow up with Dr. Chase Caller or APP in 2-3 weeks and As needed   Please contact office for sooner follow up if symptoms do not improve or worsen or seek emergency care

## 2018-08-07 ENCOUNTER — Ambulatory Visit: Payer: BLUE CROSS/BLUE SHIELD | Admitting: Adult Health

## 2018-08-08 ENCOUNTER — Encounter: Payer: Self-pay | Admitting: Adult Health

## 2018-08-08 ENCOUNTER — Ambulatory Visit: Payer: BLUE CROSS/BLUE SHIELD | Admitting: Adult Health

## 2018-08-08 DIAGNOSIS — J479 Bronchiectasis, uncomplicated: Secondary | ICD-10-CM | POA: Diagnosis not present

## 2018-08-08 NOTE — Assessment & Plan Note (Signed)
Appears stable  Continue on pulmonary hygiene   Plan  Patient Instructions  Continue on Symbicort 80 2 puffs twice daily, rinse after use Continue on Allegra 180 mg daily Continue on Flonase 2 puffs daily Saline nasal rinses Mucinex DM twice daily as needed for cough and congestion Use flutter valve 3 times daily  Follow up with Dr. Chase Caller in 2 months with PFT and As needed   Please contact office for sooner follow up if symptoms do not improve or worsen or seek emergency care

## 2018-08-08 NOTE — Progress Notes (Signed)
@Patient  ID: Jessica Bates, female    DOB: 05-07-1961, 57 y.o.   MRN: [id number]  Chief Complaint  Patient presents with  . Follow-up    Bronchiectasis     Referring provider: Hulan Fess, MD  HPI: 57 year old female former smoker followed for chronic cough bronchiectasis Medical history significant for Crohn's disease and  Raynauds syndrome .   TEST  PFTs>> 06/14/2017>> mild obstruction>> slightly decreased DLCO  FENO>> 06/13/2017>>18 ppb  CXR>>06/13/2017>>No active cardiopulmonary disease.  CT Chest 05/17/2017>Continued improvement in post inflammatory scarring in the RIGHT middle lobe and lingula. Stable small pulmonary nodule.  CT chest October 2019 showed stable 4 mm right middle lobe nodule.  Similar appearance of mild right middle lobe and lingular bronchiectasis and small airway impaction.  08/08/2018 Follow up: Bronchiectasis  Patient presents for a 2-week follow-up.  Patient has some mild right middle lobe and lingula bronchiectasis on CT with ongoing cough and congestion.. Previous PFTs showed some mild obstruction.  Patient was started on Symbicort last visit.  Started on a chronic rhinitis prevention regimen with Allegra and Flonase.. Uses flutter valve she is unsure if she is better or not. She got cold like symptoms 1 week ago , symptoms are better  . No discolored mucus. She denies chest pain or orthopnea or edema.   Tries to stay active, works full time. Has low energy .        Allergies  Allergen Reactions  . Cymbalta [Duloxetine Hcl]     Pt unsure   . Penicillins     Rash   . Remicade [Infliximab]     Flushing    Immunization History  Administered Date(s) Administered  . Influenza Split 06/13/2018  . Influenza,inj,Quad PF,6+ Mos 06/14/2015, 06/13/2016  . Influenza-Unspecified 06/12/2017  . Pneumococcal Conjugate-13 12/31/2016  . Pneumococcal Polysaccharide-23 12/31/2016    Past Medical History:  Diagnosis Date  . Asthma   . Crohn  disease ([place])   . Depression meopause  . Dyslipidemia   . Fibromyalgia   . GERD (gastroesophageal reflux disease)   . Hemorrhoids   . Hiatal hernia   . Melanoma ([place])   . Pulmonary nodules     Tobacco History: Social History   Tobacco Use  Smoking Status Former Smoker  . Packs/day: 1.00  . Years: 9.00  . Pack years: 9.00  . Types: Cigarettes  . Last attempt to quit: 09/13/1998  . Years since quitting: 19.9  Smokeless Tobacco Never Used   Counseling given: Not Answered   Outpatient Medications Prior to Visit  Medication Sig Dispense Refill  . budesonide-formoterol (SYMBICORT) 80-4.5 MCG/ACT inhaler Inhale 2 puffs into the lungs 2 (two) times daily. 1 Inhaler 3  . cetirizine (ZYRTEC) 10 MG tablet Take 10 mg by mouth daily.    Marland Kitchen estradiol (VIVELLE-DOT) 0.075 MG/24HR Place 1 patch onto the skin See admin instructions. Sunday Tuesday    . Eszopiclone 3 MG TABS Take 3 mg by mouth at bedtime. Take immediately before bedtime    . mesalamine (APRISO) 0.375 g 24 hr capsule Take 0.375 mg by mouth daily.     . methocarbamol (ROBAXIN) 500 MG tablet TAKE 1 TABLET BY MOUTH TWICE A DAY AS NEEDED MUSCLE TIGHTNESS  1  . montelukast (SINGULAIR) 10 MG tablet every evening.  5  . progesterone (PROMETRIUM) 100 MG capsule Take 100 mg by mouth daily.    Marland Kitchen Respiratory Therapy Supplies (FLUTTER) DEVI Use flutter valve 3 times a day 1 each 0  . budesonide-formoterol (SYMBICORT) 80-4.5  MCG/ACT inhaler Inhale 2 puffs into the lungs 2 (two) times daily. 1 Inhaler 0  . atorvastatin (LIPITOR) 40 MG tablet Take 40 mg by mouth daily.    Marland Kitchen omeprazole (PRILOSEC) 40 MG capsule Take 40 mg by mouth 2 (two) times daily.     No facility-administered medications prior to visit.      Review of Systems  Constitutional:   No  weight loss, night sweats,  Fevers, chills, + fatigue, or  lassitude.  HEENT:   No headaches,  Difficulty swallowing,  Tooth/dental problems, or  Sore throat,                No sneezing,  itching, ear ache, nasal congestion, post nasal drip,   CV:  No chest pain,  Orthopnea, PND, swelling in lower extremities, anasarca, dizziness, palpitations, syncope.   GI  No heartburn, indigestion, abdominal pain, nausea, vomiting, diarrhea, change in bowel habits, loss of appetite, bloody stools.   Resp: .  No chest wall deformity  Skin: no rash or lesions.  GU: no dysuria, change in color of urine, no urgency or frequency.  No flank pain, no hematuria   MS:  No joint pain or swelling.  No decreased range of motion.  No back pain.    Physical Exam  BP 114/68 (BP Location: Left Arm, Cuff Size: Normal)   Pulse 68   Ht 5' 6"  (1.676 m)   Wt 131 lb 6.4 oz (59.6 kg)   LMP 02/14/2014 Comment: bcp//A.C.  SpO2 98%   BMI 21.21 kg/m   GEN: A/Ox3; pleasant , NAD, thin female    HEENT:  [place]/AT,  EACs-clear, TMs-wnl, NOSE-clear, THROAT-clear, no lesions, no postnasal drip or exudate noted.   NECK:  Supple w/ fair ROM; no JVD; normal carotid impulses w/o bruits; no thyromegaly or nodules palpated; no lymphadenopathy.    RESP  Clear  P & A; w/o, wheezes/ rales/ or rhonchi. no accessory muscle use, no dullness to percussion  CARD:  RRR, no m/r/g, no peripheral edema, pulses intact, no cyanosis or clubbing.  GI:   Soft & nt; nml bowel sounds; no organomegaly or masses detected.   Musco: Warm bil, no deformities or joint swelling noted.   Neuro: alert, no focal deficits noted.    Skin: Warm, no lesions or rashes    Lab Results:   BMET  BNP No results found for: BNP  ProBNP No results found for: PROBNP  Imaging: Dg Chest 2 View  Result Date: 07/19/2018 CLINICAL DATA:  Chest tightness and shortness of breath for 5 days. EXAM: CHEST - 2 VIEW COMPARISON:  PA and lateral chest 01/06/2018.  CT chest 06/21/2018. FINDINGS: The lungs are clear. Heart size is normal. No pneumothorax or pleural effusion. Mild convex left scoliosis noted. IMPRESSION: No acute disease. Electronically  Signed   By: Inge Rise M.D.   On: 07/19/2018 11:44      PFT Results Latest Ref Rng & Units 06/14/2017  FVC-Pre L 3.15  FVC-Predicted Pre % 88  FVC-Post L 3.14  FVC-Predicted Post % 88  Pre FEV1/FVC % % 72  Post FEV1/FCV % % 77  FEV1-Pre L 2.27  FEV1-Predicted Pre % 82  FEV1-Post L 2.43  DLCO UNC% % 65  DLCO COR %Predicted % 73  TLC L 5.02  TLC % Predicted % 96  RV % Predicted % 103    Lab Results  Component Value Date   NITRICOXIDE 18 06/13/2017        Assessment &  Plan:   Bronchiectasis without complication ([place]) Appears stable  Continue on pulmonary hygiene   Plan  Patient Instructions  Continue on Symbicort 80 2 puffs twice daily, rinse after use Continue on Allegra 180 mg daily Continue on Flonase 2 puffs daily Saline nasal rinses Mucinex DM twice daily as needed for cough and congestion Use flutter valve 3 times daily  Follow up with Dr. Chase Caller in 2 months with PFT and As needed   Please contact office for sooner follow up if symptoms do not improve or worsen or seek emergency care            Rexene Edison, NP 08/08/2018

## 2018-08-08 NOTE — Patient Instructions (Signed)
Continue on Symbicort 80 2 puffs twice daily, rinse after use Continue on Allegra 180 mg daily Continue on Flonase 2 puffs daily Saline nasal rinses Mucinex DM twice daily as needed for cough and congestion Use flutter valve 3 times daily  Follow up with Dr. Chase Caller in 2 months with PFT and As needed   Please contact office for sooner follow up if symptoms do not improve or worsen or seek emergency care

## 2018-08-31 DIAGNOSIS — M1811 Unilateral primary osteoarthritis of first carpometacarpal joint, right hand: Secondary | ICD-10-CM | POA: Diagnosis not present

## 2018-09-18 ENCOUNTER — Telehealth: Payer: Self-pay | Admitting: Internal Medicine

## 2018-09-18 MED ORDER — OSELTAMIVIR PHOSPHATE 75 MG PO CAPS: 75.0000 mg | ORAL_CAPSULE | Freq: Every day | ORAL | 0 refills | Status: DC

## 2018-09-18 NOTE — Telephone Encounter (Signed)
Per TP: okay for prophylactic Tamiflu 57m daily for 7 days #7 with no refills.  Thank you.

## 2018-09-18 NOTE — Telephone Encounter (Signed)
Called and spoke with Patient.  TP recommendations given.  Understanding stated. Tamiflu sent to [facility].  Nothing further at this time.   Per TP: okay for prophylactic Tamiflu 95m daily for 7 days #7 with no refills.

## 2018-09-18 NOTE — Telephone Encounter (Signed)
Called and spoke with Patient.  She stated that her son was diagnosed with Influenza B this weekend.  She is requesting Tamiflu to be sent to [place].  Will route message to Alvira Monday, NP

## 2018-09-22 ENCOUNTER — Ambulatory Visit: Payer: BLUE CROSS/BLUE SHIELD | Admitting: Pulmonary Disease

## 2018-09-22 ENCOUNTER — Encounter: Payer: Self-pay | Admitting: Pulmonary Disease

## 2018-09-22 VITALS — BP 122/60 | HR 84 | Temp 98.0°F | Ht 66.0 in | Wt 131.6 lb

## 2018-09-22 DIAGNOSIS — J321 Chronic frontal sinusitis: Secondary | ICD-10-CM

## 2018-09-22 DIAGNOSIS — J479 Bronchiectasis, uncomplicated: Secondary | ICD-10-CM

## 2018-09-22 DIAGNOSIS — R911 Solitary pulmonary nodule: Secondary | ICD-10-CM | POA: Diagnosis not present

## 2018-09-22 MED ORDER — DOXYCYCLINE HYCLATE 100 MG PO TABS: 100.0000 mg | ORAL_TABLET | Freq: Two times a day (BID) | ORAL | 0 refills | Status: DC

## 2018-09-22 MED ORDER — PREDNISONE 10 MG PO TABS: ORAL_TABLET | ORAL | 0 refills | Status: DC

## 2018-09-22 NOTE — Assessment & Plan Note (Addendum)
Assessment: -Purulent nasal drainage -Increased postnasal drip -Eustachian tube dysfunction -Patient reporting sinus headache -Maxillary and frontal sinus tenderness left greater than right -Recent exposure to flu -Patient already on daily antihistamine, nasal saline rinses, intranasal steroids  Plan: -7-day course of doxycycline -Short course of prednisone -Continue Flonase daily  -Increase Nettipot 2-3 times a day  -Continue Allegra daily  >>>ok to alternate with zyrtec  -Continue Singulair

## 2018-09-22 NOTE — Patient Instructions (Addendum)
Doxycycline >>> 1 100 mg tablet every 12 hours for 7 days >>>take with food  >>>wear sunscreen   Prednisone 56m tablet  >>>Take 2 tablets (20 mg total) daily for the next 5 days >>> Take with food in the morning  Continue Flonase daily  Increase Nettipot 2-3 times a day  Continue Allegra daily  >>>ok to alternate with zyrtec  Continue Singulair  Continue Prilosec  Omeprazole 40 mg tablet  >>>Please take 1 tablet daily 15 minutes to 30 minutes before your first meal of the day as well as before your other medications >>>Try to take at the same time each day >>>take this medication daily  GERD management: >>>Avoid laying flat until 2 hours after meals >>>Elevate head of the bed including entire chest >>>Reduce size of meals and amount of fat, acid, spices, caffeine and sweets >>>If you are smoking, Please stop! >>>Decrease alcohol consumption >>>Work on maintaining a healthy weight with normal BMI      Continue Symbicort 80 >>> 2 puffs in the morning right when you wake up, rinse out your mouth after use, 12 hours later 2 puffs, rinse after use >>> Take this daily, no matter what >>> This is not a rescue inhaler    Bronchiectasis: This is the medical term which indicates that you have damage, dilated airways making you more susceptible to respiratory infection. Use a flutter valve 10 breaths twice a day or 4 to 5 breaths 4-5 times a day to help clear mucus out Let [facility] if you have cough with change in mucus color or fevers or chills.  At that point you would need an antibiotic. Maintain a healthy nutritious diet, eating whole foods Take your medications as prescribed   Keep follow up with TP in Jan/2020  It is flu season:   >>>Remember to be washing your hands regularly, using hand sanitizer, be careful to use around herself with has contact with people who are sick will increase her chances of getting sick yourself. >>> Best ways to protect herself from the flu:  Receive the yearly flu vaccine, practice good hand hygiene washing with soap and also using hand sanitizer when available, eat a nutritious meals, get adequate rest, hydrate appropriately   Please contact the office if your symptoms worsen or you have concerns that you are not improving.   Thank you for choosing [facility] for your healthcare, and for allowing [facility] partner with you on your healthcare journey. I am thankful to be able to provide care to you today.   BWyn QuakerFNP-C

## 2018-09-22 NOTE — Assessment & Plan Note (Addendum)
Assessment: Lungs clear to auscultation today Patient with green mucus from productive cough  Plan: Continue flutter valve use as prescribed Doxycycline for 7 days Continue Allegra Continue Nettie pot Continue Flonase Continue Singulair Continue Symbicort 80 as prescribed

## 2018-09-22 NOTE — Progress Notes (Signed)
@Patient  ID: Jessica Bates, female    DOB: [birth year], 58 y.o.   MRN: [id number]  Chief Complaint  Patient presents with  . Acute Visit    Cough    Referring provider: Hulan Fess, MD  HPI:  58 year old female former smoker followed in our office for bronchiectasis and chronic cough  PMH: Crohn's disease, Raynaud's syndrome, allergic rhinitis Smoker/ Smoking History: Former Smoker.  9-pack-year smoking history.  Quit in 2000 Maintenance:  Symbicort 80 Pt of: Dr. Chase Caller  Recent [facility] Encounters:   09/18/2018 patient's son was at diagnosed with the flu and patient was treated with Tamiflu prophylactically >>> Positive for flu B  09/22/2018  - Visit   58 year old female former smoker presenting to office today for acute symptoms of worsening cough, sinus pressure and congestion, increased fatigue, increased postnasal drip.  Patient was exposed to the flu by her son (son was swabbed 09/16/2018 and positive for flu B).  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  Patient was started on Tamiflu prophylactically on 09/18/2018.  Patient reported that symptoms of nasal congestion started 09/17/2018.  Patient has remained adherent to daily Allegra (sometimes she alternates with Zyrtec), daily Nettie pot use, flutter valve use 2-3 times a day 10 breaths each time, and Tamiflu.  Patient reports that symptoms continue to worsen.  Patient now has a hoarse voice and sinus headache.  Patient is worried that she may start to develop bronchitis symptoms.  Patient reports that now she has a productive cough with green mucus and she is getting nasal drainage with yellow mucus.   Tests:  PFTs>> 06/14/2017>> mild obstruction>> slightly decreased DLCO  FENO>> 06/13/2017>>18 ppb  CXR>>06/13/2017>>No active cardiopulmonary disease.  CT Chest 05/17/2017>Continued improvement in post inflammatory scarring in the RIGHT middle lobe and lingula. Stable small pulmonary  nodule.  CT chest October 2019 showed stable 4 mm right middle lobe nodule.  Similar appearance of mild right middle lobe and lingular bronchiectasis and small airway impaction.  11/07/2016- CBC with differential-eosinophils relative 00 feels absolute 0  FENO:  Lab Results  Component Value Date   NITRICOXIDE 18 06/13/2017    PFT: PFT Results Latest Ref Rng & Units 06/14/2017  FVC-Pre L 3.15  FVC-Predicted Pre % 88  FVC-Post L 3.14  FVC-Predicted Post % 88  Pre FEV1/FVC % % 72  Post FEV1/FCV % % 77  FEV1-Pre L 2.27  FEV1-Predicted Pre % 82  FEV1-Post L 2.43  DLCO UNC% % 65  DLCO COR %Predicted % 73  TLC L 5.02  TLC % Predicted % 96  RV % Predicted % 103    Imaging: No results found.    Specialty Problems      Pulmonary Problems   Chronic cough   Bronchiectasis without complication [facility])    CT chest October 2019 showed stable 4 mm right middle lobe nodule.  Similar appearance of mild right middle lobe and lingular bronchiectasis and small airway impaction.      Pulmonary nodule, right    Single 22m right middle lobe subpleural nodule, stable in size and appearance since prior study- considered benign process and is likely subpleural lypmh node  CT Chest 05/17/2017>Continued improvement in post inflammatory scarring in the RIGHT middle lobe and lingula. Stable small pulmonary nodule.      Sinusitis chronic, frontal      Allergies  Allergen Reactions  . Cymbalta [Duloxetine Hcl]     Pt unsure   . Penicillins  Rash   . Remicade [Infliximab]     Flushing    Immunization History  Administered Date(s) Administered  . Influenza Split 06/13/2018  . Influenza,inj,Quad PF,6+ Mos 06/14/2015, 06/13/2016  . Influenza-Unspecified 06/12/2017  . Pneumococcal Conjugate-13 12/31/2016  . Pneumococcal Polysaccharide-23 12/31/2016    Past Medical History:  Diagnosis Date  . Asthma   . Crohn disease ([place])   . Depression meopause  . Dyslipidemia   .  Fibromyalgia   . GERD (gastroesophageal reflux disease)   . Hemorrhoids   . Hiatal hernia   . Melanoma ([employer])   . Pulmonary nodules     Tobacco History: Social History   Tobacco Use  Smoking Status Former Smoker  . Packs/day: 1.00  . Years: 9.00  . Pack years: 9.00  . Types: Cigarettes  . Last attempt to quit: 09/13/1998  . Years since quitting: 20.0  Smokeless Tobacco Never Used   Counseling given: Yes  Continue to not smoke  Outpatient Encounter Medications as of 09/22/2018  Medication Sig  . atorvastatin (LIPITOR) 40 MG tablet Take 40 mg by mouth daily.  . budesonide-formoterol (SYMBICORT) 80-4.5 MCG/ACT inhaler Inhale 2 puffs into the lungs 2 (two) times daily.  . cetirizine (ZYRTEC) 10 MG tablet Take 10 mg by mouth daily.  Marland Kitchen estradiol (VIVELLE-DOT) 0.075 MG/24HR Place 1 patch onto the skin See admin instructions. Sunday Tuesday  . Eszopiclone 3 MG TABS Take 3 mg by mouth at bedtime. Take immediately before bedtime  . mesalamine (APRISO) 0.375 g 24 hr capsule Take 0.375 mg by mouth daily.   . methocarbamol (ROBAXIN) 500 MG tablet TAKE 1 TABLET BY MOUTH TWICE A DAY AS NEEDED MUSCLE TIGHTNESS  . montelukast (SINGULAIR) 10 MG tablet every evening.  Marland Kitchen omeprazole (PRILOSEC) 40 MG capsule Take 40 mg by mouth 2 (two) times daily.  Marland Kitchen oseltamivir (TAMIFLU) 75 MG capsule Take 1 capsule (75 mg total) by mouth daily.  . progesterone (PROMETRIUM) 100 MG capsule Take 100 mg by mouth daily.  Marland Kitchen Respiratory Therapy Supplies (FLUTTER) DEVI Use flutter valve 3 times a day  . doxycycline (VIBRA-TABS) 100 MG tablet Take 1 tablet (100 mg total) by mouth 2 (two) times daily.  . predniSONE (DELTASONE) 10 MG tablet Take 2 tablets (44m total) daily for the next 5 days. Take in the AM with food.   No facility-administered encounter medications on file as of 09/22/2018.      Review of Systems  Review of Systems  Constitutional: Positive for fatigue. Negative for chills and fever.  HENT:  Positive for congestion, rhinorrhea, sinus pressure, sinus pain, sore throat and voice change (hoarseness).   Respiratory: Positive for cough (productive with green mucous ), shortness of breath and wheezing. Negative for chest tightness.   Cardiovascular: Negative for chest pain and palpitations.  Gastrointestinal: Negative for diarrhea, nausea and vomiting.  Musculoskeletal: Negative for arthralgias.     Physical Exam  BP 122/60 (BP Location: Left Arm, Cuff Size: Normal)   Pulse 84   Temp 98 F (36.7 C) (Oral)   Ht 5' 6"  (1.676 m)   Wt 131 lb 9.6 oz (59.7 kg)   LMP 02/14/2014 Comment: bcp//A.C.  SpO2 98%   BMI 21.24 kg/m   Wt Readings from Last 5 Encounters:  09/22/18 131 lb 9.6 oz (59.7 kg)  08/08/18 131 lb 6.4 oz (59.6 kg)  07/19/18 134 lb 6.4 oz (61 kg)  07/11/18 133 lb 9.6 oz (60.6 kg)  01/18/18 131 lb 12.8 oz (59.8 kg)   Physical Exam  Constitutional: She is oriented to person, place, and time and well-developed, well-nourished, and in no distress. No distress.  HENT:  Head: Normocephalic and atraumatic.  Right Ear: Hearing, external ear and ear canal normal.  Left Ear: Hearing, external ear and ear canal normal.  Nose: Mucosal edema and rhinorrhea present. Right sinus exhibits maxillary sinus tenderness. Right sinus exhibits no frontal sinus tenderness. Left sinus exhibits maxillary sinus tenderness and frontal sinus tenderness (Most sensitive site to palpation).  Mouth/Throat: Uvula is midline and oropharynx is clear and moist. No oropharyngeal exudate.  + TMs with effusion without infection bilaterally + Postnasal drip + Maxillary tenderness left greater than right  Eyes: Pupils are equal, round, and reactive to light.  Neck: Normal range of motion. Neck supple. No JVD present.  Cardiovascular: Normal rate, regular rhythm and normal heart sounds.  Pulmonary/Chest: Effort normal and breath sounds normal. No accessory muscle usage. No respiratory distress. She has no  decreased breath sounds. She has no wheezes. She has no rhonchi.  Abdominal: Soft. Bowel sounds are normal. There is no abdominal tenderness.  Musculoskeletal: Normal range of motion.        General: No edema.  Lymphadenopathy:    She has no cervical adenopathy.  Neurological: She is alert and oriented to person, place, and time. Gait normal.  Skin: Skin is warm and dry. She is not diaphoretic. No erythema.  Psychiatric: Mood, memory, affect and judgment normal.  Nursing note and vitals reviewed.     Lab Results:  CBC    Component Value Date/Time   WBC 13.3 (H) 11/07/2016 1157   RBC 3.82 (L) 11/07/2016 1157   HGB 11.7 (L) 11/07/2016 1157   HCT 36.1 11/07/2016 1157   PLT 425 (H) 11/07/2016 1157   MCV 94.5 11/07/2016 1157   MCH 30.6 11/07/2016 1157   MCHC 32.4 11/07/2016 1157   RDW 12.2 11/07/2016 1157   LYMPHSABS 2.5 11/07/2016 1157   MONOABS 0.9 11/07/2016 1157   EOSABS 0.0 11/07/2016 1157   BASOSABS 0.0 11/07/2016 1157    BMET    Component Value Date/Time   NA 135 05/13/2017 1209   K 3.8 05/13/2017 1209   CL 101 05/13/2017 1209   CO2 27 05/13/2017 1209   GLUCOSE 94 05/13/2017 1209   BUN 12 05/13/2017 1209   CREATININE 0.73 05/13/2017 1209   CALCIUM 9.3 05/13/2017 1209   GFRNONAA >60 11/07/2016 1157   GFRAA >60 11/07/2016 1157    BNP No results found for: BNP  ProBNP No results found for: PROBNP    Assessment & Plan:   Sinusitis chronic, frontal Assessment: -Purulent nasal drainage -Increased postnasal drip -Eustachian tube dysfunction -Patient reporting sinus headache -Maxillary and frontal sinus tenderness left greater than right -Recent exposure to flu -Patient already on daily antihistamine, nasal saline rinses, intranasal steroids  Plan: -7-day course of doxycycline -Short course of prednisone -Continue Flonase daily  -Increase Nettipot 2-3 times a day  -Continue Allegra daily  >>>ok to alternate with zyrtec  -Continue Singulair     Bronchiectasis without complication ([place]) Assessment: Lungs clear to auscultation today Patient with green mucus from productive cough  Plan: Continue flutter valve use as prescribed Doxycycline for 7 days Continue Allegra Continue Nettie pot Continue Flonase Continue Singulair     Lauraine Rinne, NP 09/22/2018   This appointment was 26 minutes long with over 50% of the time in direct face-to-face patient care, assessment, plan of care, and follow-up.

## 2018-09-25 ENCOUNTER — Ambulatory Visit: Payer: BLUE CROSS/BLUE SHIELD | Admitting: Adult Health

## 2018-09-28 DIAGNOSIS — M542 Cervicalgia: Secondary | ICD-10-CM | POA: Diagnosis not present

## 2018-09-28 DIAGNOSIS — R51 Headache: Secondary | ICD-10-CM | POA: Diagnosis not present

## 2018-10-03 ENCOUNTER — Encounter: Payer: Self-pay | Admitting: Primary Care

## 2018-10-03 ENCOUNTER — Ambulatory Visit (INDEPENDENT_AMBULATORY_CARE_PROVIDER_SITE_OTHER)
Admission: RE | Admit: 2018-10-03 | Discharge: 2018-10-03 | Disposition: A | Discharge status: Home / Self Care | Payer: BLUE CROSS/BLUE SHIELD | Source: Ambulatory Visit | Attending: Primary Care | Admitting: Primary Care

## 2018-10-03 ENCOUNTER — Ambulatory Visit: Payer: BLUE CROSS/BLUE SHIELD | Admitting: Primary Care

## 2018-10-03 VITALS — BP 110/62 | HR 89 | Temp 98.0°F | Ht 66.0 in | Wt 132.0 lb

## 2018-10-03 DIAGNOSIS — R6889 Other general symptoms and signs: Secondary | ICD-10-CM | POA: Diagnosis not present

## 2018-10-03 DIAGNOSIS — J479 Bronchiectasis, uncomplicated: Secondary | ICD-10-CM | POA: Diagnosis not present

## 2018-10-03 DIAGNOSIS — R05 Cough: Secondary | ICD-10-CM | POA: Diagnosis not present

## 2018-10-03 DIAGNOSIS — J321 Chronic frontal sinusitis: Secondary | ICD-10-CM

## 2018-10-03 LAB — RESPIRATORY VIRUS PANEL
Influenza A RNA: NOT DETECTED
Influenza B RNA: NOT DETECTED
RSV RNA: NOT DETECTED
hMPV: NOT DETECTED

## 2018-10-03 NOTE — Patient Instructions (Addendum)
CXR today (will call tomorrow)  Respiratory viral panel  Advise fluids, rest and tylenol as needed for fever   Ok to take prednisone (21m x 5 days; then 1105mx 5 days; stop)     Influenza, Adult Influenza is also called "the flu." It is an infection in the lungs, nose, and throat (respiratory tract). It is caused by a virus. The flu causes symptoms that are similar to symptoms of a cold. It also causes a high fever and body aches. The flu spreads easily from person to person (is contagious). Getting a flu shot (influenza vaccination) every year is the best way to prevent the flu. What are the causes? This condition is caused by the influenza virus. You can get the virus by:  Breathing in droplets that are in the air from the cough or sneeze of a person who has the virus.  Touching something that has the virus on it (is contaminated) and then touching your mouth, nose, or eyes. What increases the risk? Certain things may make you more likely to get the flu. These include:  Not washing your hands often.  Having close contact with many people during cold and flu season.  Touching your mouth, eyes, or nose without first washing your hands.  Not getting a flu shot every year. You may have a higher risk for the flu, along with serious problems such as a lung infection (pneumonia), if you:  Are older than 65.  Are pregnant.  Have a weakened disease-fighting system (immune system) because of a disease or taking certain medicines.  Have a long-term (chronic) illness, such as: ? Heart, kidney, or lung disease. ? Diabetes. ? Asthma.  Have a liver disorder.  Are very overweight (morbidly obese).  Have anemia. This is a condition that affects your red blood cells. What are the signs or symptoms? Symptoms usually begin suddenly and last 4-14 days. They may include:  Fever and chills.  Headaches, body aches, or muscle aches.  Sore throat.  Cough.  Runny or stuffy  (congested) nose.  Chest discomfort.  Not wanting to eat as much as normal (poor appetite).  Weakness or feeling tired (fatigue).  Dizziness.  Feeling sick to your stomach (nauseous) or throwing up (vomiting). How is this treated? If the flu is found early, you can be treated with medicine that can help reduce how bad the illness is and how long it lasts (antiviral medicine). This may be given by mouth (orally) or through an IV tube. Taking care of yourself at home can help your symptoms get better. Your doctor may suggest:  Taking over-the-counter medicines.  Drinking plenty of fluids. The flu often goes away on its own. If you have very bad symptoms or other problems, you may be treated in a hospital. Follow these instructions at home:     Activity  Rest as needed. Get plenty of sleep.  Stay home from work or school as told by your doctor. ? Do not leave home until you do not have a fever for 24 hours without taking medicine. ? Leave home only to visit your doctor. Eating and drinking  Take an ORS (oral rehydration solution). This is a drink that is sold at pharmacies and stores.  Drink enough fluid to keep your pee (urine) pale yellow.  Drink clear fluids in small amounts as you are able. Clear fluids include: ? Water. ? Ice chips. ? Fruit juice that has water added (diluted fruit juice). ? Low-calorie sports drinks.  Eat bland, easy-to-digest foods in small amounts as you are able. These foods include: ? Bananas. ? Applesauce. ? Rice. ? Lean meats. ? Toast. ? Crackers.  Do not eat or drink: ? Fluids that have a lot of sugar or caffeine. ? Alcohol. ? Spicy or fatty foods. General instructions  Take over-the-counter and prescription medicines only as told by your doctor.  Use a cool mist humidifier to add moisture to the air in your home. This can make it easier for you to breathe.  Cover your mouth and nose when you cough or sneeze.  Wash your hands  with soap and water often, especially after you cough or sneeze. If you cannot use soap and water, use alcohol-based hand sanitizer.  Keep all follow-up visits as told by your doctor. This is important. How is this prevented?   Get a flu shot every year. You may get the flu shot in late summer, fall, or winter. Ask your doctor when you should get your flu shot.  Avoid contact with people who are sick during fall and winter (cold and flu season). Contact a doctor if:  You get new symptoms.  You have: ? Chest pain. ? Watery poop (diarrhea). ? A fever.  Your cough gets worse.  You start to have more mucus.  You feel sick to your stomach.  You throw up. Get help right away if you:  Have shortness of breath.  Have trouble breathing.  Have skin or nails that turn a bluish color.  Have very bad pain or stiffness in your neck.  Get a sudden headache.  Get sudden pain in your face or ear.  Cannot eat or drink without throwing up. Summary  Influenza ("the flu") is an infection in the lungs, nose, and throat. It is caused by a virus.  Take over-the-counter and prescription medicines only as told by your doctor.  Getting a flu shot every year is the best way to avoid getting the flu. This information is not intended to replace advice given to you by your health care provider. Make sure you discuss any questions you have with your health care provider. Document Released: 06/08/2008 Document Revised: 02/15/2018 Document Reviewed: 02/15/2018 Elsevier Interactive Patient Education  2019 [employer].     Bronchiectasis  Bronchiectasis is a condition in which the airways in the lungs (bronchi) are damaged and widened. The condition makes it hard for the lungs to get rid of mucus, and it causes mucus to gather in the bronchi. This condition often leads to lung infections, which can make the condition worse. What are the causes? You can be born with this condition or you can  develop it later in life. Common causes of this condition include:  Cystic fibrosis.  Repeated lung infections, such as pneumonia or tuberculosis.  An object or other blockage in the lungs.  Breathing in fluid, food, or other objects (aspiration).  A problem with the immune system and lung structure that is present at birth (congenital). Sometimes the cause is not known. What are the signs or symptoms? Common symptoms of this condition include:  A daily cough that brings up mucus and lasts for more than 3 weeks.  Lung infections that happen often.  Shortness of breath and wheezing.  Weakness and fatigue. How is this diagnosed? This condition is diagnosed with tests, such as:  Chest X-rays or CT scans. These are done to check for changes in the lungs.  Breathing tests. These are done to check how  well your lungs are working.  A test of a sample of your saliva (sputum culture). This test is done to check for infection.  Blood tests and other tests. These are done to check for related diseases or causes. How is this treated? Treatment for this condition depends on the severity of the illness and its cause. Treatment may include:  Medicines that loosen mucus so it can be coughed up (expectorants).  Medicines that relax the muscles of the bronchi (bronchodilators).  Antibiotic medicines to prevent or treat infection.  Physical therapy to help clear mucus from the lungs. Techniques may include: ? Postural drainage. This is when you sit or lie in certain positions so that mucus can drain by gravity. ? Chest percussion. This involves tapping the chest or back with a cupped hand. ? Chest vibration. For this therapy, a hand or special equipment vibrates your chest and back.  Surgery to remove the affected part of the lung. This may be done in severe cases. Follow these instructions at home: Medicines  Take over-the-counter and prescription medicines only as told by your health  care provider.  If you were prescribed an antibiotic medicine, take it as told by your health care provider. Do not stop taking the antibiotic even if you start to feel better.  Avoid taking sedatives and antihistamines unless your health care provider tells you to take them. These medicines tend to thicken the mucus in the lungs. Managing symptoms  Perform breathing exercises or techniques to clear your lungs as told by your health care provider.  Consider using a cold steam vaporizer or humidifier in your room or home to help loosen secretions.  If you have a cough that gets worse at night, try sleeping in a semi-upright position. General instructions  Get plenty of rest.  Drink enough fluid to keep your urine clear or pale yellow.  Stay inside when pollution and ozone levels are high.  Stay up to date with vaccinations and immunizations.  Avoid cigarette smoke and other lung irritants.  Do not use any products that contain nicotine or tobacco, such as cigarettes and e-cigarettes. If you need help quitting, ask your health care provider.  Keep all follow-up visits as told by your health care provider. This is important. Contact a health care provider if:  You cough up more sputum than before and the sputum is yellow or green in color.  You have a fever.  You cannot control your cough and are losing sleep. Get help right away if:  You cough up blood.  You have chest pain.  You have increasing shortness of breath.  You have pain that gets worse or is not controlled with medicines.  You have a fever and your symptoms suddenly get worse. Summary  Bronchiectasis is a condition in which the airways in the lungs (bronchi) are damaged and widened. The condition makes it hard for the lungs to get rid of mucus, and it causes mucus to gather in the bronchi.  Treatment usually includes therapy to help clear mucus from the lungs.  Stay up to date with vaccinations and  immunizations. This information is not intended to replace advice given to you by your health care provider. Make sure you discuss any questions you have with your health care provider. Document Released: 06/27/2007 Document Revised: 10/04/2016 Document Reviewed: 10/04/2016 Elsevier Interactive Patient Education  2019 [employer].

## 2018-10-03 NOTE — Assessment & Plan Note (Signed)
-   No signs of infection today - Completed doxycyline course - Advised mucinex DM and tylenol

## 2018-10-03 NOTE — Progress Notes (Signed)
@Patient  ID: Jessica Bates, female    DOB: 1960-12-27, 58 y.o.   MRN: [id number]  Chief Complaint  Patient presents with  . Acute Visit    Ribcage pain and wanted to know if she has pne.    Referring provider: Hulan Fess, MD  HPI: 58 year old female, former smoker quit in 2000 (9 pack year hx). PMH significant for bronchiectasis, chronic cough, sinusitis, Crohn's and Raynauds. Patient of Dr. Chase Caller. Son was diagnosed with flu on 09/18/18 and she was given Tamiflu prophylactically. Last seen by pulmonary NP on 09/22/18, she was treated with 7 day course Doxycyline and prednisone. Maintained on Symbicort 80.  10/03/2018 Patient presents today with acute complaints of cough, sinus headache and bilateral rib pain. Complains of fever, last reported 1 week ago. She was given 58m prednisone taper by PCP. She does not feel her sinuses are currently infected. Traveling next few weeks for work. She is concerned about influenza developing into possible pneumonia. She does have a hx of bronchiectasis. Uses her flutter valve as prescribed and Symbicort twice daily. Afebrile today in office.    Significant Testing: PFTs>> 06/14/2017>> mild obstruction>> slightly decreased DLCO  FENO>> 06/13/2017>>18 ppb  CXR>>06/13/2017>>No active cardiopulmonary disease.  CT Chest 05/17/2017>Continued improvement in post inflammatory scarring in the RIGHT middle lobe and lingula. Stable small pulmonary nodule.  CT chest October 2019 showed stable 4 mm right middle lobe nodule. Similar appearance of mild right middle lobe and lingular bronchiectasis and small airway impaction.  11/07/2016- CBC with differential-eosinophils relative 00 feels absolute 0   Allergies  Allergen Reactions  . Cymbalta [Duloxetine Hcl]     Pt unsure   . Penicillins     Rash   . Remicade [Infliximab]     Flushing    Immunization History  Administered Date(s) Administered  . Influenza Split 06/13/2018  .  Influenza,inj,Quad PF,6+ Mos 06/14/2015, 06/13/2016  . Influenza-Unspecified 06/12/2017  . Pneumococcal Conjugate-13 12/31/2016  . Pneumococcal Polysaccharide-23 12/31/2016    Past Medical History:  Diagnosis Date  . Asthma   . Crohn disease ([place]   . Depression meopause  . Dyslipidemia   . Fibromyalgia   . GERD (gastroesophageal reflux disease)   . Hemorrhoids   . Hiatal hernia   . [place]anoma [place]   . Pulmonary nodules     Tobacco History: Social History   Tobacco Use  Smoking Status Former Smoker  . Packs/day: 1.00  . Years: 9.00  . Pack years: 9.00  . Types: Cigarettes  . Last attempt to quit: 09/13/1998  . Years since quitting: 20.0  Smokeless Tobacco Never Used   Counseling given: Not Answered   Outpatient Medications Prior to Visit  Medication Sig Dispense Refill  . atorvastatin (LIPITOR) 40 MG tablet Take 40 mg by mouth daily.    . budesonide-formoterol (SYMBICORT) 80-4.5 MCG/ACT inhaler Inhale 2 puffs into the lungs 2 (two) times daily. 1 Inhaler 3  . cetirizine (ZYRTEC) 10 MG tablet Take 10 mg by mouth daily.    .Marland Kitchenestradiol (VIVELLE-DOT) 0.075 MG/24HR Place 1 patch onto the skin See admin instructions. Sunday Tuesday    . Eszopiclone 3 MG TABS Take 3 mg by mouth at bedtime. Take immediately before bedtime    . mesalamine (APRISO) 0.375 g 24 hr capsule Take 0.375 mg by mouth daily.     . methocarbamol (ROBAXIN) 500 MG tablet TAKE 1 TABLET BY MOUTH TWICE A DAY AS NEEDED MUSCLE TIGHTNESS  1  . montelukast (SINGULAIR) 10 MG tablet  every evening.  5  . omeprazole (PRILOSEC) 40 MG capsule Take 40 mg by mouth 2 (two) times daily.    . progesterone (PROMETRIUM) 100 MG capsule Take 100 mg by mouth daily.    Marland Kitchen Respiratory Therapy Supplies (FLUTTER) DEVI Use flutter valve 3 times a day 1 each 0  . doxycycline (VIBRA-TABS) 100 MG tablet Take 1 tablet (100 mg total) by mouth 2 (two) times daily. 14 tablet 0  . oseltamivir (TAMIFLU) 75 MG capsule Take 1 capsule (75 mg total)  by mouth daily. 7 capsule 0  . predniSONE (DELTASONE) 10 MG tablet Take 2 tablets (40m total) daily for the next 5 days. Take in the AM with food. 10 tablet 0   No facility-administered medications prior to visit.     Review of Systems  Review of Systems  Constitutional: Positive for fever.  Respiratory: Positive for cough and chest tightness. Negative for shortness of breath.   Cardiovascular: Negative.   Neurological: Positive for headaches.    Physical Exam  BP 110/62 (BP Location: Left Arm, Patient Position: Sitting, Cuff Size: Normal)   Pulse 89   Temp 98 F (36.7 C) (Oral)   Ht 5' 6"  (1.676 m)   Wt 132 lb (59.9 kg)   LMP 02/14/2014 Comment: bcp//A.C.  SpO2 97%   BMI 21.31 kg/m  Physical Exam Constitutional:      General: She is not in acute distress.    Appearance: Normal appearance. She is normal weight. She is ill-appearing.  HENT:     Head: Normocephalic and atraumatic.     Right Ear: Tympanic membrane normal.     Left Ear: Tympanic membrane normal.     Ears:     Comments: Fluid to bilateral TM, no signs infection     Mouth/Throat:     Mouth: Mucous membranes are moist.     Pharynx: Oropharynx is clear.  Cardiovascular:     Rate and Rhythm: Normal rate and regular rhythm.  Pulmonary:     Effort: No respiratory distress.     Breath sounds: No wheezing.  Musculoskeletal: Normal range of motion.  Skin:    General: Skin is warm and dry.  Neurological:     General: No focal deficit present.     Mental Status: She is alert and oriented to person, place, and time. Mental status is at baseline.  Psychiatric:        Mood and Affect: Mood normal.        Behavior: Behavior normal.        Thought Content: Thought content normal.        Judgment: Judgment normal.      Lab Results:  CBC    Component Value Date/Time   WBC 13.3 (H) 11/07/2016 1157   RBC 3.82 (L) 11/07/2016 1157   HGB 11.7 (L) 11/07/2016 1157   HCT 36.1 11/07/2016 1157   PLT 425 (H)  11/07/2016 1157   MCV 94.5 11/07/2016 1157   MCH 30.6 11/07/2016 1157   MCHC 32.4 11/07/2016 1157   RDW 12.2 11/07/2016 1157   LYMPHSABS 2.5 11/07/2016 1157   MONOABS 0.9 11/07/2016 1157   EOSABS 0.0 11/07/2016 1157   BASOSABS 0.0 11/07/2016 1157    BMET    Component Value Date/Time   NA 135 05/13/2017 1209   K 3.8 05/13/2017 1209   CL 101 05/13/2017 1209   CO2 27 05/13/2017 1209   GLUCOSE 94 05/13/2017 1209   BUN 12 05/13/2017 1209   CREATININE 0.73 05/13/2017  1209   CALCIUM 9.3 05/13/2017 1209   GFRNONAA >60 11/07/2016 1157   GFRAA >60 11/07/2016 1157    BNP No results found for: BNP  ProBNP No results found for: PROBNP  Imaging: Dg Chest 2 View  Result Date: 10/03/2018 CLINICAL DATA:  Chest pressure and cough. EXAM: CHEST - 2 VIEW COMPARISON:  PA and lateral chest 07/19/2018.  CT chest 06/21/2018. FINDINGS: Small focus of atelectasis or scar in the lingula is unchanged. Lungs otherwise clear. Heart size normal. No pneumothorax or pleural effusion. No acute or focal bony abnormality. IMPRESSION: No acute disease. Electronically Signed   By: Inge Rise M.D.   On: 10/03/2018 16:10     Assessment & Plan:   Flu-like symptoms - Exposed to influenza and treated prophylactically in early Jan - Persistent fever after treatment - Check respiratory viral panel - Advise fluids, rest and tylenol as needed for fever   Sinusitis chronic, frontal - No signs of infection today - Completed doxycyline course - Advised mucinex DM and tylenol   Bronchiectasis without complication ([place]) - CXR with no acute disease  - Continue mucinex and flutter valve - Advised prednisone 53m x 5 days; 141mx 5 days; stop    ElMartyn EhrichNP 10/03/2018

## 2018-10-03 NOTE — Assessment & Plan Note (Signed)
-   CXR with no acute disease  - Continue mucinex and flutter valve - Advised prednisone 52m x 5 days; 135mx 5 days; stop

## 2018-10-03 NOTE — Assessment & Plan Note (Addendum)
-   Exposed to influenza and treated prophylactically in early Jan - Persistent fever after treatment - Check respiratory viral panel - Advise fluids, rest and tylenol as needed for fever

## 2018-10-09 ENCOUNTER — Ambulatory Visit: Payer: BLUE CROSS/BLUE SHIELD | Admitting: Adult Health

## 2018-10-15 ENCOUNTER — Other Ambulatory Visit: Payer: Self-pay | Admitting: Adult Health

## 2018-11-24 ENCOUNTER — Telehealth: Payer: Self-pay | Admitting: Primary Care

## 2018-11-24 MED ORDER — DOXYCYCLINE HYCLATE 100 MG PO TABS: 100.0000 mg | ORAL_TABLET | Freq: Two times a day (BID) | ORAL | 0 refills | Status: DC

## 2018-11-24 NOTE — Telephone Encounter (Signed)
I spoke with pt, she states she has bronchiectasis and she works with a lot of people. Do we have any recommendation for her with everything that is going on? Should she keep an ABX on hand? Please advise.

## 2018-11-24 NOTE — Telephone Encounter (Signed)
Wash hands frequently, stay away from large crowds more than 50 people. Avoid travel to hot spots. If develops fever or increased shortness of breath notify us. I am fine sending in an abx to have on hand, what does she normally take?

## 2018-11-24 NOTE — Telephone Encounter (Signed)
Pt returning call

## 2018-11-24 NOTE — Telephone Encounter (Signed)
ATC pt, no answer. Left message for pt to call back.  

## 2018-11-24 NOTE — Telephone Encounter (Signed)
Ok, will send in Doxycycline to have on hand if develops bronchiectasis flare. Please monitor for fever and shortness of breath and notify [place]. Avoid travel and large. Crowds.

## 2018-11-24 NOTE — Telephone Encounter (Signed)
Patient is returning phone call.  Patient phone number is [phone]. May leave detailed message on voicemail.

## 2018-11-24 NOTE — Telephone Encounter (Signed)
Spoke with the pt and notified of recs and she verbalized understanding

## 2018-11-24 NOTE — Telephone Encounter (Signed)
Spoke with the pt and notified of recs per [facility]  She verbalized understanding  She states that she is unsure of what abx works best for her  She knows that the cipro does not help She is okay with whatever you recommendation is, thanks   Allergies  Allergen Reactions  . Cymbalta [Duloxetine Hcl]     Pt unsure   . Penicillins     Rash   . Remicade [Infliximab]     Flushing

## 2018-11-24 NOTE — Telephone Encounter (Signed)
LMTCB

## 2018-12-07 DIAGNOSIS — M65841 Other synovitis and tenosynovitis, right hand: Secondary | ICD-10-CM | POA: Diagnosis not present

## 2018-12-07 DIAGNOSIS — M1811 Unilateral primary osteoarthritis of first carpometacarpal joint, right hand: Secondary | ICD-10-CM | POA: Diagnosis not present

## 2018-12-11 DIAGNOSIS — F410 Panic disorder [episodic paroxysmal anxiety] without agoraphobia: Secondary | ICD-10-CM | POA: Diagnosis not present

## 2019-03-08 DIAGNOSIS — K5090 Crohn's disease, unspecified, without complications: Secondary | ICD-10-CM | POA: Diagnosis not present

## 2019-04-04 DIAGNOSIS — S61207A Unspecified open wound of left little finger without damage to nail, initial encounter: Secondary | ICD-10-CM | POA: Diagnosis not present

## 2019-04-20 ENCOUNTER — Other Ambulatory Visit: Payer: Self-pay | Admitting: Adult Health

## 2019-04-30 DIAGNOSIS — D225 Melanocytic nevi of trunk: Secondary | ICD-10-CM | POA: Diagnosis not present

## 2019-04-30 DIAGNOSIS — Z85820 Personal history of malignant melanoma of skin: Secondary | ICD-10-CM | POA: Diagnosis not present

## 2019-04-30 DIAGNOSIS — L821 Other seborrheic keratosis: Secondary | ICD-10-CM | POA: Diagnosis not present

## 2019-04-30 DIAGNOSIS — L814 Other melanin hyperpigmentation: Secondary | ICD-10-CM | POA: Diagnosis not present

## 2019-05-14 DIAGNOSIS — M1811 Unilateral primary osteoarthritis of first carpometacarpal joint, right hand: Secondary | ICD-10-CM | POA: Diagnosis not present

## 2019-05-23 DIAGNOSIS — N8182 Incompetence or weakening of pubocervical tissue: Secondary | ICD-10-CM | POA: Diagnosis not present

## 2019-05-23 DIAGNOSIS — N952 Postmenopausal atrophic vaginitis: Secondary | ICD-10-CM | POA: Diagnosis not present

## 2019-05-29 DIAGNOSIS — R102 Pelvic and perineal pain: Secondary | ICD-10-CM | POA: Diagnosis not present

## 2019-06-06 DIAGNOSIS — F411 Generalized anxiety disorder: Secondary | ICD-10-CM | POA: Diagnosis not present

## 2019-06-15 DIAGNOSIS — Z23 Encounter for immunization: Secondary | ICD-10-CM | POA: Diagnosis not present

## 2019-06-21 DIAGNOSIS — N8189 Other female genital prolapse: Secondary | ICD-10-CM | POA: Diagnosis not present

## 2019-07-12 ENCOUNTER — Other Ambulatory Visit: Payer: BLUE CROSS/BLUE SHIELD

## 2019-07-16 DIAGNOSIS — K5090 Crohn's disease, unspecified, without complications: Secondary | ICD-10-CM | POA: Diagnosis not present

## 2019-07-20 ENCOUNTER — Other Ambulatory Visit: Payer: Self-pay

## 2019-07-20 ENCOUNTER — Ambulatory Visit
Admission: RE | Admit: 2019-07-20 | Discharge: 2019-07-20 | Disposition: A | Discharge status: Home / Self Care | Payer: Self-pay | Source: Ambulatory Visit | Attending: Primary Care | Admitting: Primary Care

## 2019-07-20 ENCOUNTER — Encounter (INDEPENDENT_AMBULATORY_CARE_PROVIDER_SITE_OTHER): Payer: Self-pay

## 2019-07-20 DIAGNOSIS — R918 Other nonspecific abnormal finding of lung field: Secondary | ICD-10-CM

## 2019-07-24 DIAGNOSIS — Z01419 Encounter for gynecological examination (general) (routine) without abnormal findings: Secondary | ICD-10-CM | POA: Diagnosis not present

## 2019-07-24 DIAGNOSIS — Z6820 Body mass index (BMI) 20.0-20.9, adult: Secondary | ICD-10-CM | POA: Diagnosis not present

## 2019-07-24 DIAGNOSIS — Z1231 Encounter for screening mammogram for malignant neoplasm of breast: Secondary | ICD-10-CM | POA: Diagnosis not present

## 2019-08-01 DIAGNOSIS — Z1159 Encounter for screening for other viral diseases: Secondary | ICD-10-CM | POA: Diagnosis not present

## 2019-08-06 DIAGNOSIS — K5140 Inflammatory polyps of colon without complications: Secondary | ICD-10-CM | POA: Diagnosis not present

## 2019-08-06 DIAGNOSIS — K5289 Other specified noninfective gastroenteritis and colitis: Secondary | ICD-10-CM | POA: Diagnosis not present

## 2019-08-06 DIAGNOSIS — K5010 Crohn's disease of large intestine without complications: Secondary | ICD-10-CM | POA: Diagnosis not present

## 2019-08-07 DIAGNOSIS — N8182 Incompetence or weakening of pubocervical tissue: Secondary | ICD-10-CM | POA: Diagnosis not present

## 2019-08-08 ENCOUNTER — Ambulatory Visit (INDEPENDENT_AMBULATORY_CARE_PROVIDER_SITE_OTHER)
Admission: RE | Admit: 2019-08-08 | Discharge: 2019-08-08 | Disposition: A | Discharge status: Home / Self Care | Payer: BC Managed Care – PPO | Source: Ambulatory Visit | Attending: Primary Care | Admitting: Primary Care

## 2019-08-08 ENCOUNTER — Other Ambulatory Visit: Payer: Self-pay

## 2019-08-08 DIAGNOSIS — R911 Solitary pulmonary nodule: Secondary | ICD-10-CM | POA: Diagnosis not present

## 2019-08-08 DIAGNOSIS — R918 Other nonspecific abnormal finding of lung field: Secondary | ICD-10-CM

## 2019-08-13 NOTE — Progress Notes (Signed)
Please let patient know CT chest showed stable 4 mm right upper lobe pulmonary nodule, consistent with benign etiology. No new or enlarging nodules or masses. Scarring noted bilaterally.

## 2019-08-13 NOTE — H&P (Signed)
Patient name almeter, westhoff DICTATION#  [id number] CSN# [id number]  Arvella Nigh, MD 08/13/2019 6:03 AM

## 2019-08-13 NOTE — H&P (Signed)
NAME: Jessica Bates, Jessica Bates MEDICAL RECORD [id number] ACCOUNT [id number] DATE OF BIRTH:Jul 28, 1961 FACILITY: [facility] LOCATION:  PHYSICIAN:Kerigan Narvaez Sherran Needs, MD  HISTORY AND PHYSICAL  DATE OF ADMISSION:  08/27/2019  HISTORY OF PRESENT ILLNESS:  The patient is a 58 year old gravida 4, para 3, postmenopausal patient presents today for laparoscopic assisted vaginal hysterectomy with removal of both fallopian tubes and ovaries along with an anterior and posterior repair  and sacrospinous ligament suspension.  The patient has been having increasing pelvic pain and pressure secondary to what is felt to be pelvic relaxation in the form of a moderate uterine descensus with moderate rectocele.  She has had previous gastrointestinal evaluation.  She does have  Crohn's disease.  We did a complete pelvic ultrasound, which was unremarkable.  So we feel like she is symptomatic from the pelvic relaxation.  She now presents for the above noted surgery.  Other alternatives were discussed including watching  conservatively, physical therapy, or pessary.  She declines each one of those.  ALLERGIES:  She is ALLERGIC TO CYMBALTA, PENICILLINS AND REMICADE.  MEDICATIONS:  She uses acyclovir 400 mg 1 tablet twice daily, alprazolam 0.25 mg tablets, atorvastatin 40 mg tablets, estradiol 0.05 mg patches that she changes twice weekly, micronized progesterone 100 mg tablet at bedtime, Symbicort 80 mcg inhaler,  Linzess 145 mcg capsule, Mezolam extended release 0.375 mg tablets, methocarbamol 500 mg tablets as needed for pain, montelukast 10 mg tablets, omeprazole 20 mg delayed release tablets, Premarin vaginal cream,   PAST MEDICAL HISTORY:  She has a history of arthritis.  She has a history of melanoma of the skin.  She has a history of asthma and migraine headaches.  She is also actively managed for Crohn's disease.  OBSTETRICAL HISTORY:  She has had 4 spontaneous vaginal deliveries and 1 miscarriage.  She has not had any other  surgical management.  SOCIAL HISTORY:  No alcohol or tobacco use.  FAMILY HISTORY:  Noncontributory.  REVIEW OF SYSTEMS:  Noncontributory.  PHYSICAL EXAMINATION: VITAL SIGNS:  The patient is afebrile with stable vital signs. HEENT:  The patient is normocephalic.  Pupils equal, round and reactive to light and accommodation.  Extraocular movements are intact.  Sclerae and conjunctivae for clear.  Oropharynx clear. NECK:  Without thyromegaly. BREASTS:  No discrete masses. LUNGS:  Clear. CARDIOVASCULAR:  Regular rhythm and rate.  No murmurs or gallops. ABDOMEN:  Benign.  No masses, organomegaly or tenderness. PELVIC:  Moderate uterine descensus with associated cystocele and rectocele.  Otherwise, normal external genitalia.  Vaginal mucosa is clear.  Cervix unremarkable.  Uterus normal size, shape and contour.  Adnexa free of masses or tenderness. EXTREMITIES:  Trace edema. NEUROLOGIC:  Grossly within normal limits.  IMPRESSION: 1.  Symptomatic pelvic relaxation. 2.  Crohn's disease. 3.  Arthritis. 4.  Asthma. 5.  History of melanoma.  PLAN:  The patient to undergo laparoscopic assisted vaginal hysterectomy with removal of both tubes and ovaries along with an anterior and posterior repair and sacrospinous ligament suspension.  The nature of the procedure has been discussed.  Success  rates of 85% are quoted.  Risks can include infection.  Risk of hemorrhage that could require transfusion with the risk of AIDS or hepatitis.  Risk of injury to adjacent organs such as bladder, bowel, ureters that could require further exploratory  surgery.  Risk of deep venous thrombosis and pulmonary embolus.  The patient expressed understanding of the risks, indications, and alternatives.  CN/NUANCE  D:08/13/2019 T:08/13/2019 JOB:[id number]

## 2019-08-15 NOTE — Progress Notes (Signed)
LMOMTCB x 1 

## 2019-08-16 ENCOUNTER — Telehealth: Payer: Self-pay | Admitting: Primary Care

## 2019-08-16 NOTE — Telephone Encounter (Signed)
Left detailed message as authorized above regarding CT Chest results:  Result Notes for CT Chest Wo Contrast Notes recorded by Rinaldo Ratel, CMA on 08/15/2019 at 2:02 PM EST  LMOM TCB x1.  ------   Notes recorded by Martyn Ehrich, NP on 08/13/2019 at 4:48 PM EST  Please let patient know CT chest showed stable 4 mm right upper lobe pulmonary nodule, consistent with  benign etiology. No new or enlarging nodules or masses. Scarring noted bilaterally.

## 2019-08-22 NOTE — Patient Instructions (Addendum)
YOU ARE SCHEDULED FOR A COVID TEST Thursday 08-23-2019 at 240 pm.THIS TEST MUST BE DONE BEFORE SURGERY. GO TO  [address], [place], [address] AND REMAIN IN YOUR CAR, THIS IS A DRIVE UP TEST. ONCE YOUR COVID TEST IS DONE PLEASE FOLLOW ALL THE QUARANTINE  INSTRUCTIONS GIVEN IN YOUR HANDOUT.      Your procedure is scheduled on 08-27-2019  Report to [facility]   Call this number if you have problems the morning of surgery  :[phone].   OUR ADDRESS IS [place].  WE ARE LOCATED IN [facility].                                     REMEMBER:  DO NOT EAT FOOD OR DRINK LIQUIDS AFTER MIDNIGHT .  YOU MAY  BRUSH YOUR TEETH MORNING OF SURGERY AND RINSE YOUR MOUTH OUT, NO CHEWING GUM CANDY OR MINTS.   TAKE THESE MEDICATIONS MORNING OF SURGERY WITH A SIP OF WATER:   Alprazolam (xanax) if needed, certrizine (zyrtec), acyclovir (zovirax), .  IF YOU ARE SPENDING THE NIGHT AFTER SURGERY PLEASE BRING ALL YOUR PRESCRIPTION MEDICATIONS IN THEIR ORIGINAL BOTTLES. 1 VISITOR IS ALLOWED IN WAITING ROOM ONLY DAY OF SURGERY. NO VISITOR MAY SPEND THE NIGHT. VISITOR ARE ALLOWED TO STAY UNTIL 800 PM.                                    DO NOT WEAR JEWERLY, MAKE UP, OR NAIL POLISH ON FINGERNAILS. DO NOT WEAR LOTIONS, POWDERS, PERFUMES OR DEODORANT. DO NOT SHAVE FOR 24 HOURS PRIOR TO DAY OF SURGERY. MEN MAY SHAVE FACE AND NECK. CONTACTS, GLASSES, OR DENTURES MAY NOT BE WORN TO SURGERY.                                    [facility] IS NOT RESPONSIBLE  FOR ANY BELONGINGS.                                                                    Marland Kitchen                                         [facility] - Preparing for Surgery Before surgery, you can play an important role.  Because skin is not sterile, your skin needs to be as free of germs as possible.  You can reduce the number of germs on your skin by washing with CHG (chlorahexidine gluconate) soap before  surgery.  CHG is an antiseptic cleaner which kills germs and bonds with the skin to continue killing germs even after washing. Please DO NOT use if you have an allergy to CHG or antibacterial soaps.  If your skin becomes reddened/irritated stop using the CHG and inform your nurse when you arrive at Short Stay. Do not shave (including legs and underarms) for at  least 48 hours prior to the first CHG shower.  You may shave your face/neck. Please follow these instructions carefully:  1.  Shower with CHG Soap the night before surgery and the  morning of Surgery.  2.  If you choose to wash your hair, wash your hair first as usual with your  normal  shampoo.  3.  After you shampoo, rinse your hair and body thoroughly to remove the  shampoo.                           4.  Use CHG as you would any other liquid soap.  You can apply chg directly  to the skin and wash                       Gently with a scrungie or clean washcloth.  5.  Apply the CHG Soap to your body ONLY FROM THE NECK DOWN.   Do not use on face/ open                           Wound or open sores. Avoid contact with eyes, ears mouth and genitals (private parts).                       Wash face,  Genitals (private parts) with your normal soap.             6.  Wash thoroughly, paying special attention to the area where your surgery  will be performed.  7.  Thoroughly rinse your body with warm water from the neck down.  8.  DO NOT shower/wash with your normal soap after using and rinsing off  the CHG Soap.                9.  Pat yourself dry with a clean towel.            10.  Wear clean pajamas.            11.  Place clean sheets on your bed the night of your first shower and do not  sleep with pets. Day of Surgery : Do not apply any lotions/deodorants the morning of surgery.  Please wear clean clothes to the hospital/surgery center.  FAILURE TO FOLLOW THESE INSTRUCTIONS MAY RESULT IN THE CANCELLATION OF YOUR SURGERY PATIENT  SIGNATURE_________________________________  NURSE SIGNATURE__________________________________  ________________________________________________________________________  WHAT IS A BLOOD TRANSFUSION? Blood Transfusion Information  A transfusion is the replacement of blood or some of its parts. Blood is made up of multiple cells which provide different functions.  Red blood cells carry oxygen and are used for blood loss replacement.  White blood cells fight against infection.  Platelets control bleeding.  Plasma helps clot blood.  Other blood products are available for specialized needs, such as hemophilia or other clotting disorders. BEFORE THE TRANSFUSION  Who gives blood for transfusions?   Healthy volunteers who are fully evaluated to make sure their blood is safe. This is blood bank blood. Transfusion therapy is the safest it has ever been in the practice of medicine. Before blood is taken from a donor, a complete history is taken to make sure that person has no history of diseases nor engages in risky social behavior (examples are intravenous drug use or sexual activity with multiple partners). The donor's travel history is screened to minimize risk of transmitting infections, such  as malaria. The donated blood is tested for signs of infectious diseases, such as HIV and hepatitis. The blood is then tested to be sure it is compatible with you in order to minimize the chance of a transfusion reaction. If you or a relative donates blood, this is often done in anticipation of surgery and is not appropriate for emergency situations. It takes many days to process the donated blood. RISKS AND COMPLICATIONS Although transfusion therapy is very safe and saves many lives, the main dangers of transfusion include:   Getting an infectious disease.  Developing a transfusion reaction. This is an allergic reaction to something in the blood you were given. Every precaution is taken to prevent  this. The decision to have a blood transfusion has been considered carefully by your caregiver before blood is given. Blood is not given unless the benefits outweigh the risks. AFTER THE TRANSFUSION  Right after receiving a blood transfusion, you will usually feel much better and more energetic. This is especially true if your red blood cells have gotten low (anemic). The transfusion raises the level of the red blood cells which carry oxygen, and this usually causes an energy increase.  The nurse administering the transfusion will monitor you carefully for complications. HOME CARE INSTRUCTIONS  No special instructions are needed after a transfusion. You may find your energy is better. Speak with your caregiver about any limitations on activity for underlying diseases you may have. SEEK MEDICAL CARE IF:   Your condition is not improving after your transfusion.  You develop redness or irritation at the intravenous (IV) site. SEEK IMMEDIATE MEDICAL CARE IF:  Any of the following symptoms occur over the next 12 hours:  Shaking chills.  You have a temperature by mouth above 102 F (38.9 C), not controlled by medicine.  Chest, back, or muscle pain.  People around you feel you are not acting correctly or are confused.  Shortness of breath or difficulty breathing.  Dizziness and fainting.  You get a rash or develop hives.  You have a decrease in urine output.  Your urine turns a dark color or changes to pink, red, or brown. Any of the following symptoms occur over the next 10 days:  You have a temperature by mouth above 102 F (38.9 C), not controlled by medicine.  Shortness of breath.  Weakness after normal activity.  The white part of the eye turns yellow (jaundice).  You have a decrease in the amount of urine or are urinating less often.  Your urine turns a dark color or changes to pink, red, or brown. Document Released: 08/27/2000 Document Revised: 11/22/2011 Document  Reviewed: 04/15/2008 [facility] Patient Information 2014 [place], [place].  _______________________________________________________________________

## 2019-08-23 ENCOUNTER — Other Ambulatory Visit (HOSPITAL_COMMUNITY)
Admission: RE | Admit: 2019-08-23 | Discharge: 2019-08-23 | Disposition: A | Discharge status: Home / Self Care | Payer: BC Managed Care – PPO | Source: Ambulatory Visit | Attending: Obstetrics and Gynecology | Admitting: Obstetrics and Gynecology

## 2019-08-23 ENCOUNTER — Encounter (HOSPITAL_COMMUNITY)
Admission: RE | Admit: 2019-08-23 | Discharge: 2019-08-23 | Disposition: A | Discharge status: Home / Self Care | Payer: BC Managed Care – PPO | Source: Ambulatory Visit | Attending: Obstetrics and Gynecology | Admitting: Obstetrics and Gynecology

## 2019-08-23 ENCOUNTER — Encounter (HOSPITAL_COMMUNITY): Payer: Self-pay

## 2019-08-23 ENCOUNTER — Other Ambulatory Visit: Payer: Self-pay

## 2019-08-23 DIAGNOSIS — Z01812 Encounter for preprocedural laboratory examination: Secondary | ICD-10-CM | POA: Diagnosis not present

## 2019-08-23 DIAGNOSIS — Z20828 Contact with and (suspected) exposure to other viral communicable diseases: Secondary | ICD-10-CM | POA: Insufficient documentation

## 2019-08-23 LAB — CBC
HCT: 41 % (ref 36.0–46.0)
Hemoglobin: 12.7 g/dL (ref 12.0–15.0)
MCH: 31.4 pg (ref 26.0–34.0)
MCHC: 31 g/dL (ref 30.0–36.0)
MCV: 101.2 fL — ABNORMAL HIGH (ref 80.0–100.0)
Platelets: 313 10*3/uL (ref 150–400)
RBC: 4.05 MIL/uL (ref 3.87–5.11)
RDW: 11.6 % (ref 11.5–15.5)
WBC: 7.4 10*3/uL (ref 4.0–10.5)
nRBC: 0 % (ref 0.0–0.2)

## 2019-08-23 LAB — BASIC METABOLIC PANEL
Anion gap: 7 (ref 5–15)
BUN: 12 mg/dL (ref 6–20)
CO2: 27 mmol/L (ref 22–32)
Calcium: 9 mg/dL (ref 8.9–10.3)
Chloride: 105 mmol/L (ref 98–111)
Creatinine, Ser: 0.55 mg/dL (ref 0.44–1.00)
GFR calc Af Amer: >60 mL/min (ref >60)
GFR calc non Af Amer: >60 mL/min (ref >60)
Glucose, Bld: 114 mg/dL — ABNORMAL HIGH (ref 70–99)
Potassium: 4 mmol/L (ref 3.5–5.1)
Sodium: 139 mmol/L (ref 135–145)

## 2019-08-23 NOTE — Progress Notes (Signed)
PCP - kevin little Cardiologist - none Pulmonology elizabeth walsh np lov 10-03-18 epic Chest ct 08-08-19 epic EKG - none Stress Test -none  ECHO - none Cardiac Cath - none  Sleep Study - none CPAP - none  Fasting Blood Sugar - n/a Checks Blood Sugar _____ times a day  Blood Thinner Instructions:n/a Aspirin Instructions:n/a Last Dose:  Anesthesia review:   Patient denies shortness of breath, fever, cough and chest pain at PAT appointment   Patient verbalized understanding of instructions that were given to them at the PAT appointment. Patient was also instructed that they will need to review over the PAT instructions again at home before surgery.

## 2019-08-24 LAB — NOVEL CORONAVIRUS, NAA (HOSP ORDER, SEND-OUT TO REF LAB; TAT 18-24 HRS): SARS-CoV-2, NAA: NOT DETECTED

## 2019-08-24 LAB — ABO/RH: ABO/RH(D): A POS

## 2019-08-27 ENCOUNTER — Observation Stay (HOSPITAL_BASED_OUTPATIENT_CLINIC_OR_DEPARTMENT_OTHER): Payer: BC Managed Care – PPO | Admitting: Physician Assistant

## 2019-08-27 ENCOUNTER — Encounter (HOSPITAL_COMMUNITY)
Admission: AD | Disposition: A | Discharge status: Home / Self Care | Payer: Self-pay | Source: Other Acute Inpatient Hospital | Attending: Obstetrics and Gynecology

## 2019-08-27 ENCOUNTER — Observation Stay (HOSPITAL_BASED_OUTPATIENT_CLINIC_OR_DEPARTMENT_OTHER): Payer: BC Managed Care – PPO | Admitting: Anesthesiology

## 2019-08-27 ENCOUNTER — Encounter (HOSPITAL_BASED_OUTPATIENT_CLINIC_OR_DEPARTMENT_OTHER): Payer: Self-pay | Admitting: Obstetrics and Gynecology

## 2019-08-27 ENCOUNTER — Inpatient Hospital Stay (HOSPITAL_BASED_OUTPATIENT_CLINIC_OR_DEPARTMENT_OTHER)
Admission: AD | Admit: 2019-08-27 | Discharge: 2019-08-29 | DRG: 742 | Disposition: A | Discharge status: Home / Self Care | Payer: BC Managed Care – PPO | Source: Other Acute Inpatient Hospital | Attending: Obstetrics and Gynecology | Admitting: Obstetrics and Gynecology

## 2019-08-27 ENCOUNTER — Other Ambulatory Visit: Payer: Self-pay

## 2019-08-27 DIAGNOSIS — N8110 Cystocele, unspecified: Secondary | ICD-10-CM | POA: Diagnosis not present

## 2019-08-27 DIAGNOSIS — K219 Gastro-esophageal reflux disease without esophagitis: Secondary | ICD-10-CM | POA: Diagnosis present

## 2019-08-27 DIAGNOSIS — E860 Dehydration: Secondary | ICD-10-CM | POA: Diagnosis not present

## 2019-08-27 DIAGNOSIS — N816 Rectocele: Secondary | ICD-10-CM | POA: Diagnosis not present

## 2019-08-27 DIAGNOSIS — K5090 Crohn's disease, unspecified, without complications: Secondary | ICD-10-CM | POA: Diagnosis not present

## 2019-08-27 DIAGNOSIS — Z7951 Long term (current) use of inhaled steroids: Secondary | ICD-10-CM

## 2019-08-27 DIAGNOSIS — N8189 Other female genital prolapse: Secondary | ICD-10-CM | POA: Diagnosis not present

## 2019-08-27 DIAGNOSIS — K449 Diaphragmatic hernia without obstruction or gangrene: Secondary | ICD-10-CM | POA: Diagnosis present

## 2019-08-27 DIAGNOSIS — Z20828 Contact with and (suspected) exposure to other viral communicable diseases: Secondary | ICD-10-CM | POA: Diagnosis present

## 2019-08-27 DIAGNOSIS — Z880 Allergy status to penicillin: Secondary | ICD-10-CM | POA: Diagnosis not present

## 2019-08-27 DIAGNOSIS — M797 Fibromyalgia: Secondary | ICD-10-CM | POA: Diagnosis present

## 2019-08-27 DIAGNOSIS — Z79890 Hormone replacement therapy (postmenopausal): Secondary | ICD-10-CM | POA: Diagnosis not present

## 2019-08-27 DIAGNOSIS — F329 Major depressive disorder, single episode, unspecified: Secondary | ICD-10-CM | POA: Diagnosis not present

## 2019-08-27 DIAGNOSIS — J45909 Unspecified asthma, uncomplicated: Secondary | ICD-10-CM | POA: Diagnosis not present

## 2019-08-27 DIAGNOSIS — E785 Hyperlipidemia, unspecified: Secondary | ICD-10-CM | POA: Diagnosis not present

## 2019-08-27 DIAGNOSIS — Z79899 Other long term (current) drug therapy: Secondary | ICD-10-CM

## 2019-08-27 DIAGNOSIS — Z85820 Personal history of malignant melanoma of skin: Secondary | ICD-10-CM

## 2019-08-27 DIAGNOSIS — R339 Retention of urine, unspecified: Secondary | ICD-10-CM | POA: Diagnosis not present

## 2019-08-27 DIAGNOSIS — Z90710 Acquired absence of both cervix and uterus: Secondary | ICD-10-CM | POA: Diagnosis present

## 2019-08-27 DIAGNOSIS — Z888 Allergy status to other drugs, medicaments and biological substances status: Secondary | ICD-10-CM

## 2019-08-27 HISTORY — PX: ANTERIOR AND POSTERIOR REPAIR WITH SACROSPINOUS FIXATION: SHX6536

## 2019-08-27 HISTORY — PX: LAPAROSCOPIC VAGINAL HYSTERECTOMY WITH SALPINGO OOPHORECTOMY: SHX6681

## 2019-08-27 LAB — TYPE AND SCREEN
ABO/RH(D): A POS
Antibody Screen: NEGATIVE

## 2019-08-27 SURGERY — HYSTERECTOMY, VAGINAL, LAPAROSCOPY-ASSISTED, WITH SALPINGO-OOPHORECTOMY
Anesthesia: General | Site: Vagina

## 2019-08-27 MED ORDER — PROPOFOL 10 MG/ML IV BOLUS
INTRAVENOUS | Status: AC
  Filled 2019-08-27: qty 20

## 2019-08-27 MED ORDER — LIDOCAINE-EPINEPHRINE (PF) 1 %-1:200000 IJ SOLN
INTRAMUSCULAR | Status: DC | PRN
  Administered 2019-08-27: 4 mL
  Administered 2019-08-27: 5 mL

## 2019-08-27 MED ORDER — OXYCODONE HCL 5 MG/5ML PO SOLN
5.0000 mg | Freq: Once | ORAL | Status: DC | PRN
  Filled 2019-08-27: qty 5

## 2019-08-27 MED ORDER — DEXAMETHASONE SODIUM PHOSPHATE 4 MG/ML IJ SOLN
INTRAMUSCULAR | Status: DC | PRN
  Administered 2019-08-27: 5 mg via INTRAVENOUS

## 2019-08-27 MED ORDER — CIPROFLOXACIN IN D5W 400 MG/200ML IV SOLN
400.0000 mg | INTRAVENOUS | Status: AC
  Administered 2019-08-27: 400 mg via INTRAVENOUS
  Filled 2019-08-27: qty 200

## 2019-08-27 MED ORDER — ONDANSETRON HCL 4 MG/2ML IJ SOLN
4.0000 mg | Freq: Four times a day (QID) | INTRAMUSCULAR | Status: DC | PRN
  Filled 2019-08-27: qty 2

## 2019-08-27 MED ORDER — NALOXONE HCL 0.4 MG/ML IJ SOLN
0.4000 mg | INTRAMUSCULAR | Status: DC | PRN
  Filled 2019-08-27: qty 1

## 2019-08-27 MED ORDER — LIDOCAINE 2% (20 MG/ML) 5 ML SYRINGE
INTRAMUSCULAR | Status: AC
  Filled 2019-08-27: qty 5

## 2019-08-27 MED ORDER — ONDANSETRON HCL 4 MG/2ML IJ SOLN
INTRAMUSCULAR | Status: AC
  Filled 2019-08-27: qty 2

## 2019-08-27 MED ORDER — OXYCODONE HCL 5 MG PO TABS
5.0000 mg | ORAL_TABLET | Freq: Once | ORAL | Status: DC | PRN
  Filled 2019-08-27: qty 1

## 2019-08-27 MED ORDER — EPHEDRINE SULFATE 50 MG/ML IJ SOLN
INTRAMUSCULAR | Status: DC | PRN
  Administered 2019-08-27 (×2): 10 mg via INTRAVENOUS

## 2019-08-27 MED ORDER — CLINDAMYCIN PHOSPHATE 900 MG/50ML IV SOLN
900.0000 mg | INTRAVENOUS | Status: AC
  Administered 2019-08-27: 900 mg via INTRAVENOUS
  Filled 2019-08-27: qty 50

## 2019-08-27 MED ORDER — DEXTROSE IN LACTATED RINGERS 5 % IV SOLN
INTRAVENOUS | Status: DC
  Administered 2019-08-27: via INTRAVENOUS
  Filled 2019-08-27 (×3): qty 1000

## 2019-08-27 MED ORDER — SCOPOLAMINE 1 MG/3DAYS TD PT72
MEDICATED_PATCH | TRANSDERMAL | Status: AC
  Filled 2019-08-27: qty 1

## 2019-08-27 MED ORDER — BUPIVACAINE HCL (PF) 0.25 % IJ SOLN
INTRAMUSCULAR | Status: DC | PRN
  Administered 2019-08-27: 8 mL

## 2019-08-27 MED ORDER — ONDANSETRON HCL 4 MG PO TABS
4.0000 mg | ORAL_TABLET | Freq: Four times a day (QID) | ORAL | Status: DC | PRN
  Filled 2019-08-27: qty 1

## 2019-08-27 MED ORDER — ONDANSETRON HCL 4 MG/2ML IJ SOLN
4.0000 mg | Freq: Four times a day (QID) | INTRAMUSCULAR | Status: DC | PRN
  Administered 2019-08-28 (×2): 4 mg via INTRAVENOUS
  Filled 2019-08-27: qty 2

## 2019-08-27 MED ORDER — FENTANYL CITRATE (PF) 250 MCG/5ML IJ SOLN
INTRAMUSCULAR | Status: AC
  Filled 2019-08-27: qty 5

## 2019-08-27 MED ORDER — ROCURONIUM BROMIDE 10 MG/ML (PF) SYRINGE
PREFILLED_SYRINGE | INTRAVENOUS | Status: AC
  Filled 2019-08-27: qty 10

## 2019-08-27 MED ORDER — LIDOCAINE HCL (CARDIAC) PF 100 MG/5ML IV SOSY
PREFILLED_SYRINGE | INTRAVENOUS | Status: DC | PRN
  Administered 2019-08-27: 80 mg via INTRAVENOUS

## 2019-08-27 MED ORDER — FENTANYL CITRATE (PF) 100 MCG/2ML IJ SOLN
INTRAMUSCULAR | Status: DC | PRN
  Administered 2019-08-27 (×3): 50 ug via INTRAVENOUS

## 2019-08-27 MED ORDER — ROCURONIUM BROMIDE 100 MG/10ML IV SOLN
INTRAVENOUS | Status: DC | PRN
  Administered 2019-08-27: 50 mg via INTRAVENOUS

## 2019-08-27 MED ORDER — LACTATED RINGERS IV SOLN
INTRAVENOUS | Status: DC
  Administered 2019-08-27 (×2): via INTRAVENOUS
  Filled 2019-08-27 (×2): qty 1000

## 2019-08-27 MED ORDER — SUGAMMADEX SODIUM 200 MG/2ML IV SOLN
INTRAVENOUS | Status: DC | PRN
  Administered 2019-08-27: 150 mg via INTRAVENOUS

## 2019-08-27 MED ORDER — HYDROMORPHONE 1 MG/ML IV SOLN
INTRAVENOUS | Status: DC
  Administered 2019-08-27: 30 mg via INTRAVENOUS
  Administered 2019-08-27: 2.2 mg via INTRAVENOUS
  Administered 2019-08-27: 1.6 mg via INTRAVENOUS
  Administered 2019-08-27: 1 mg via INTRAVENOUS
  Administered 2019-08-28: 2.6 mg via INTRAVENOUS
  Filled 2019-08-27 (×2): qty 30

## 2019-08-27 MED ORDER — DIPHENHYDRAMINE HCL 12.5 MG/5ML PO ELIX
12.5000 mg | ORAL_SOLUTION | Freq: Four times a day (QID) | ORAL | Status: DC | PRN
  Filled 2019-08-27: qty 5

## 2019-08-27 MED ORDER — MENTHOL 3 MG MT LOZG
1.0000 | LOZENGE | OROMUCOSAL | Status: DC | PRN
  Filled 2019-08-27: qty 9

## 2019-08-27 MED ORDER — KETOROLAC TROMETHAMINE 30 MG/ML IJ SOLN
INTRAMUSCULAR | Status: DC | PRN
  Administered 2019-08-27: 30 mg via INTRAVENOUS

## 2019-08-27 MED ORDER — MIDAZOLAM HCL 2 MG/2ML IJ SOLN
INTRAMUSCULAR | Status: AC
  Filled 2019-08-27: qty 2

## 2019-08-27 MED ORDER — CIPROFLOXACIN IN D5W 400 MG/200ML IV SOLN
INTRAVENOUS | Status: AC
  Filled 2019-08-27: qty 200

## 2019-08-27 MED ORDER — DIPHENHYDRAMINE HCL 50 MG/ML IJ SOLN
12.5000 mg | Freq: Four times a day (QID) | INTRAMUSCULAR | Status: DC | PRN
  Filled 2019-08-27: qty 0.25

## 2019-08-27 MED ORDER — ONDANSETRON HCL 4 MG/2ML IJ SOLN
INTRAMUSCULAR | Status: DC | PRN
  Administered 2019-08-27: 4 mg via INTRAVENOUS

## 2019-08-27 MED ORDER — SODIUM CHLORIDE 0.9 % IR SOLN
Status: DC | PRN
  Administered 2019-08-27: 1000 mL

## 2019-08-27 MED ORDER — CLINDAMYCIN PHOSPHATE 900 MG/50ML IV SOLN
INTRAVENOUS | Status: AC
  Filled 2019-08-27: qty 50

## 2019-08-27 MED ORDER — FENTANYL CITRATE (PF) 100 MCG/2ML IJ SOLN
INTRAMUSCULAR | Status: AC
  Filled 2019-08-27: qty 2

## 2019-08-27 MED ORDER — DEXAMETHASONE SODIUM PHOSPHATE 10 MG/ML IJ SOLN
INTRAMUSCULAR | Status: AC
  Filled 2019-08-27: qty 1

## 2019-08-27 MED ORDER — PROPOFOL 10 MG/ML IV BOLUS
INTRAVENOUS | Status: DC | PRN
  Administered 2019-08-27: 120 mg via INTRAVENOUS

## 2019-08-27 MED ORDER — MIDAZOLAM HCL 5 MG/5ML IJ SOLN
INTRAMUSCULAR | Status: DC | PRN
  Administered 2019-08-27: 2 mg via INTRAVENOUS

## 2019-08-27 MED ORDER — SODIUM CHLORIDE 0.9% FLUSH
9.0000 mL | INTRAVENOUS | Status: DC | PRN
  Filled 2019-08-27: qty 10

## 2019-08-27 MED ORDER — ONDANSETRON HCL 4 MG/2ML IJ SOLN
4.0000 mg | Freq: Once | INTRAMUSCULAR | Status: DC | PRN
  Filled 2019-08-27: qty 2

## 2019-08-27 MED ORDER — KETOROLAC TROMETHAMINE 30 MG/ML IJ SOLN
INTRAMUSCULAR | Status: AC
  Filled 2019-08-27: qty 1

## 2019-08-27 MED ORDER — FENTANYL CITRATE (PF) 100 MCG/2ML IJ SOLN
25.0000 ug | INTRAMUSCULAR | Status: DC | PRN
  Administered 2019-08-27 (×3): 50 ug via INTRAVENOUS
  Filled 2019-08-27: qty 1

## 2019-08-27 MED ORDER — HYDROCODONE-ACETAMINOPHEN 5-325 MG PO TABS
1.0000 | ORAL_TABLET | ORAL | Status: DC | PRN
  Administered 2019-08-28: 2 via ORAL
  Filled 2019-08-27: qty 2

## 2019-08-27 MED ORDER — SCOPOLAMINE 1 MG/3DAYS TD PT72
MEDICATED_PATCH | TRANSDERMAL | Status: DC | PRN
  Administered 2019-08-27: 1 via TRANSDERMAL

## 2019-08-27 MED ORDER — EPHEDRINE 5 MG/ML INJ
INTRAVENOUS | Status: AC
  Filled 2019-08-27: qty 10

## 2019-08-27 SURGICAL SUPPLY — 58 items
ADH SKN CLS APL DERMABOND .7 (GAUZE/BANDAGES/DRESSINGS) ×2
BAG DRN RND TRDRP ANRFLXCHMBR (UROLOGICAL SUPPLIES) ×2
BAG URINE DRAIN 2000ML AR STRL (UROLOGICAL SUPPLIES) ×4 IMPLANT
BLADE CLIPPER SENSICLIP SURGIC (BLADE) ×1 IMPLANT
BLADE SURG 15 STRL LF DISP TIS (BLADE) ×3 IMPLANT
BLADE SURG 15 STRL SS (BLADE) ×3
BNDG GAUZE ELAST 4 BULKY (GAUZE/BANDAGES/DRESSINGS) ×1 IMPLANT
CANISTER SUCT 3000ML PPV (MISCELLANEOUS) ×8 IMPLANT
CATH ROBINSON RED A/P 16FR (CATHETERS) ×4 IMPLANT
COVER BACK TABLE 60X90IN (DRAPES) ×4 IMPLANT
COVER MAYO STAND STRL (DRAPES) ×8 IMPLANT
COVER WAND RF STERILE (DRAPES) ×4 IMPLANT
DECANTER SPIKE VIAL GLASS SM (MISCELLANEOUS) ×1 IMPLANT
DERMABOND ADVANCED (GAUZE/BANDAGES/DRESSINGS) ×1
DERMABOND ADVANCED .7 DNX12 (GAUZE/BANDAGES/DRESSINGS) ×3 IMPLANT
DEVICE CAPIO SLIM BOX (INSTRUMENTS) ×1 IMPLANT
DRSG OPSITE POSTOP 3X4 (GAUZE/BANDAGES/DRESSINGS) ×4 IMPLANT
DURAPREP 26ML APPLICATOR (WOUND CARE) ×4 IMPLANT
ELECT REM PT RETURN 9FT ADLT (ELECTROSURGICAL) ×3
ELECTRODE REM PT RTRN 9FT ADLT (ELECTROSURGICAL) ×3 IMPLANT
GAUZE 4X4 16PLY RFD (DISPOSABLE) ×4 IMPLANT
GLOVE BIO SURGEON STRL SZ7 (GLOVE) ×19 IMPLANT
GLOVE BIO SURGEON STRL SZ7.5 (GLOVE) ×3 IMPLANT
GLOVE BIOGEL PI IND STRL 7.0 (GLOVE) IMPLANT
GLOVE BIOGEL PI INDICATOR 7.0 (GLOVE) ×2
GLOVE ECLIPSE 6.5 STRL STRAW (GLOVE) ×4 IMPLANT
HOLDER FOLEY CATH W/STRAP (MISCELLANEOUS) ×4 IMPLANT
KIT TURNOVER CYSTO (KITS) ×4 IMPLANT
NDL MAYO 6 CRC TAPER PT (NEEDLE) ×3 IMPLANT
NEEDLE HYPO 22GX1.5 SAFETY (NEEDLE) ×4 IMPLANT
NEEDLE INSUFFLATION 120MM (ENDOMECHANICALS) IMPLANT
NEEDLE MAYO 6 CRC TAPER PT (NEEDLE) ×6 IMPLANT
NS IRRIG 500ML POUR BTL (IV SOLUTION) ×4 IMPLANT
PACK LAVH (CUSTOM PROCEDURE TRAY) ×4 IMPLANT
PACK ROBOTIC GOWN (GOWN DISPOSABLE) ×1 IMPLANT
PACK VAGINAL WOMENS (CUSTOM PROCEDURE TRAY) ×4 IMPLANT
PAD OB MATERNITY 4.3X12.25 (PERSONAL CARE ITEMS) ×4 IMPLANT
PAD PREP 24X48 CUFFED NSTRL (MISCELLANEOUS) ×4 IMPLANT
SEALER TISSUE G2 CVD JAW 45CM (ENDOMECHANICALS) ×4 IMPLANT
SET IRRIG TUBING LAPAROSCOPIC (IRRIGATION / IRRIGATOR) ×4 IMPLANT
SET IRRIG Y TYPE TUR BLADDER L (SET/KITS/TRAYS/PACK) ×1 IMPLANT
SET TUBE SMOKE EVAC HIGH FLOW (TUBING) ×4 IMPLANT
SUT CAPIO POLYGLYCOLIC (SUTURE) ×1 IMPLANT
SUT CHROMIC 3 0 SH 27 (SUTURE) ×4 IMPLANT
SUT VIC AB 0 CT1 18XCR BRD8 (SUTURE) ×6 IMPLANT
SUT VIC AB 0 CT1 36 (SUTURE) ×8 IMPLANT
SUT VIC AB 0 CT1 8-18 (SUTURE) ×6
SUT VIC AB 2-0 CT2 27 (SUTURE) ×8 IMPLANT
SUT VICRYL 0 UR6 27IN ABS (SUTURE) ×1 IMPLANT
SUT VICRYL 1 TIES 12X18 (SUTURE) ×4 IMPLANT
SUT VICRYL 4-0 PS2 18IN ABS (SUTURE) ×4 IMPLANT
TOWEL OR 17X26 10 PK STRL BLUE (TOWEL DISPOSABLE) ×7 IMPLANT
TRAY FOLEY W/BAG SLVR 14FR (SET/KITS/TRAYS/PACK) ×4 IMPLANT
TROCAR OPTI TIP 5M 100M (ENDOMECHANICALS) ×4 IMPLANT
TROCAR XCEL DIL TIP R 11M (ENDOMECHANICALS) ×1 IMPLANT
TUBE CONNECTING 12X1/4 (SUCTIONS) ×1 IMPLANT
WARMER LAPAROSCOPE (MISCELLANEOUS) ×4 IMPLANT
WATER STERILE IRR 500ML POUR (IV SOLUTION) ×4 IMPLANT

## 2019-08-27 NOTE — Transfer of Care (Signed)
Immediate Anesthesia Transfer of Care Note  Patient: Jessica Bates  Procedure(s) Performed: LAPAROSCOPIC ASSISTED VAGINAL HYSTERECTOMY WITH SALPINGO OOPHORECTOMY (Bilateral Vagina ) ANTERIOR AND POSTERIOR REPAIR WITH SACROSPINOUS FIXATION (N/A )  Patient Location: PACU  Anesthesia Type:General  Level of Consciousness: awake, alert  and oriented  Airway & Oxygen Therapy: Patient Spontanous Breathing and Patient connected to nasal cannula oxygen  Post-op Assessment: Report given to RN and Post -op Vital signs reviewed and stable  Post vital signs: Reviewed and stable  Last Vitals:  Vitals Value Taken Time  BP 110/72 08/27/19 1117  Temp 36.6 C 08/27/19 1117  Pulse 76 08/27/19 1117  Resp 10 08/27/19 1117  SpO2 100 % 08/27/19 1117    Last Pain:  Vitals:   08/27/19 1100  TempSrc:   PainSc: 5       Patients Stated Pain Goal: 3 (23/00/97 9499)  Complications: No apparent anesthesia complications

## 2019-08-27 NOTE — Anesthesia Procedure Notes (Signed)
Procedure Name: Intubation Date/Time: 08/27/2019 7:35 AM Performed by: Bufford Spikes, CRNA Pre-anesthesia Checklist: Patient identified, Emergency Drugs available, Suction available and Patient being monitored Patient Re-evaluated:Patient Re-evaluated prior to induction Oxygen Delivery Method: Circle system utilized Preoxygenation: Pre-oxygenation with 100% oxygen Induction Type: IV induction Ventilation: Mask ventilation without difficulty Laryngoscope Size: Miller and 2 Grade View: Grade I Tube type: Oral Tube size: 7.0 mm Number of attempts: 1 Airway Equipment and Method: Stylet Placement Confirmation: ETT inserted through vocal cords under direct vision,  positive ETCO2 and breath sounds checked- equal and bilateral Secured at: 20 cm Tube secured with: Tape Dental Injury: Teeth and Oropharynx as per pre-operative assessment

## 2019-08-27 NOTE — Brief Op Note (Signed)
08/27/2019  9:39 AM  PATIENT:  Jessica Bates  58 y.o. female  PRE-OPERATIVE DIAGNOSIS:  PELVIC PROLAPSE, RECTOCELE, CYSTOCELE  POST-OPERATIVE DIAGNOSIS:  PELVIC PROLAPSE, RECTOCELE, CYSTOCELE  PROCEDURE:  Procedure(s) with comments: LAPAROSCOPIC ASSISTED VAGINAL HYSTERECTOMY WITH SALPINGO OOPHORECTOMY (Bilateral) - NEED BED ANTERIOR AND POSTERIOR REPAIR WITH SACROSPINOUS FIXATION (N/A)  SURGEON:  Surgeon(s) and Role:    * Arvella Nigh, MD - Primary    * Molli Posey, MD - Assisting  PHYSICIAN ASSISTANT:   ASSISTANTS: holland   ANESTHESIA:   local and general  EBL:  200 mL   BLOOD ADMINISTERED:none  DRAINS: Urinary Catheter (Foley)   LOCAL MEDICATIONS USED:  MARCAINE    and XYLOCAINE   SPECIMEN:  Source of Specimen:  uterus tubes and ovaries  DISPOSITION OF SPECIMEN:  PATHOLOGY  COUNTS:  YES  TOURNIQUET:  * No tourniquets in log *  DICTATION: .Other Dictation: Dictation Number [id number]  PLAN OF CARE: Admit for overnight observation  PATIENT DISPOSITION:  PACU - hemodynamically stable.   Delay start of Pharmacological VTE agent (>24hrs) due to surgical blood loss or risk of bleeding: no

## 2019-08-27 NOTE — Op Note (Signed)
NAME: ZARIE, KOSIBA MEDICAL RECORD NO:[id number] ACCOUNT [id number] DATE OF BIRTH:06-08-1961 FACILITY: [facility] LOCATION: [facility] PHYSICIAN:Revan Gendron Sherran Needs, MD  OPERATIVE REPORT  DATE OF PROCEDURE:  08/27/2019  PREOPERATIVE DIAGNOSIS:  Symptomatic pelvic relaxation.  POSTOPERATIVE DIAGNOSIS:  Symptomatic pelvic relaxation.  OPERATIVE PROCEDURES: 1.  Laparoscopic assisted vaginal hysterectomy with bilateral salpingo-oophorectomy.   2.  Anterior repair.   3.  Posterior repair.   4.  Sacrospinous ligament suspension.   5.  Cystoscopy.  SURGEON:  Darlyn Chamber, MD  ASSISTANT:  Molli Posey, MD  ANESTHESIA:  General endotracheal.  ESTIMATED BLOOD LOSS:  300 mL.  PACKS:  Include a vaginal pack.  DRAINS:  Included a urethral Foley.  INTRAOPERATIVE BLOOD REPLACEMENT:  None.  COMPLICATIONS:  None.  INDICATIONS:  As dictated in history and physical.  PROCEDURE:  The patient was taken to the OR and placed in supine position.  After a satisfactory level of general endotracheal anesthesia was obtained, the patient was placed in the dorsal lithotomy position using the Allen stirrups.  Perineum and vagina  were prepped out with Hibiclens.  A speculum was placed in the vaginal vault.  The cervix was grasped with a single-tooth tenaculum.  A Hulka tenaculum was then put in place.  Speculum was then removed.  Bladder was emptied out with catheterization.   The abdomen was prepped with DuraPrep.  After a period of time, it was draped in a sterile field.  Subumbilical incision made with a knife.  The Veress needle was introduced in the abdominal cavity.  Abdomen was inflated with approximately 3-4 L of  carbon dioxide.  The 10/11 trocar was then inserted.  Laparoscope was introduced.  There was no evidence of injury to adjacent organs.  A 5 mm trocar was put in place in the suprapubic area under direct visualization.  Visualization of the upper abdomen  and both lateral gutters were  clear.  Liver and gallbladder were clear.  Appendix was not visualized and may have been retrocecal.  Uterus was of normal size and shape.  Tubes and ovaries were unremarkable.  First, the right tube and ovary were elevated.   The ureter was easily descended along the right pelvic sidewall.  Using the EnSeal, the right ovarian vasculature was cauterized and incised.  Mesenteric attachments of the tubes and ovaries were cauterized and incised up to the round ligament.  The  right round ligament was cauterized and incised.  We then went to the left.  Left tube and ovary were elevated.  Ureter was easily identified.  Using the EnSeal, the left uteroovarian pedicle was cauterized and incised.  The peritoneal attachments of the  tube and ovary were cauterized and incised up to the round ligament.  The round ligament was then cauterized and incised.  The decision at this point in time was to go vaginally.  The abdomen was deflated of carbon dioxide.  Laparoscope had been  removed.  [name] legs were repositioned.  The Hulka tenaculum was then removed.  A weighted speculum was placed in the vaginal vault.  The cervix was grasped with a Ardis Hughs tenaculum.  Cul-de-sac was entered sharply.  Both uterosacral ligaments were clamped, cut and suture ligated with 0 Vicryl.   These were held.  The reflection of the vaginal mucosa anteriorly was then incised and the bladder was dissected superiorly.  Paracervical tissue was clamped, cut and suture ligated with 0 Vicryl.  Vesicouterine space was identified and entered sharply  and retractor was put in place  to keep the bladder out of the operative field.  Using the clamp, cut, and tie technique with suture ligatures of 0 Vicryl, the parametrium was serially separated from the sides of the uterus.  Uterus was then flipped and  remaining pedicles were clamped and cut.  The uterus, tubes, and ovaries were passed off the operative field.  Held pedicles were secured with  free ties of 0 Vicryl.  Posterior vaginal cuff was then run with a running locking suture of 0 chromic.    We then went to the anterior repair.  The mucosa was infiltrated with 1% Xylocaine with epinephrine.  Using a knife, an incision was made in the vaginal mucosa over the bladder.  Using blunt and sharp dissection, the pubocervical fascia was then  dissected from the overlying vaginal mucosa until we had developed a nice plane on each side.  We also dissected superiorly.  Using interrupted sutures of 2-0 Vicryl, the pubocervical fascia was brought together in the midline, thus obliterating the  cystocele.  The vaginal mucosa was then trimmed.  The vaginal cuff was closed with interrupted sutures of 2-0 Vicryl.  We had good hemostasis and good repair of the cystocele.  She did have an area that was almost like a poking out of tissue at the  bottom of the vaginal cuff where the cystocele was repaired.  We excised this and we reapproximated the mucosa with interrupted sutures of 2-0 Vicryl.  The remaining vaginal mucosa from the hysterectomy was then closed with interrupted sutures of 0  Vicryl.  We had good approximation and hemostasis.  I failed to mention, a uterosacral plication stitch was put in place prior to closure.  We had good elevation.  We then went to the posterior repair.  The mucosa was infiltrated with 1% Xylocaine with epinephrine along the perineal body.  A V incision was made over the perineal body and the skin was dissected up to the opening and excised.  Using blunt and sharp  dissection, the perirectal fascia was separated from the overlying vaginal mucosa.  We incised the vaginal mucosa in the midline up to near the vaginal cuff.  The perirectal fascia was then dissected from the overlying vaginal mucosa.  We then developed  the space on the right side down to the sacrospinous ligaments.  Using the needle passer, a suture was placed through the sacrospinous ligament.  It was then  secured to the top of the vaginal cuff using Mayo needles.  This suture was then held.  Next,  the perirectal fascia was brought together in the midline with interrupted sutures of 2-0 Vicryl, thus obliterating the rectocele.  We then began reapproximating the vaginal mucosa with 2-0 Vicryl.  We then secured the sacrospinous ligament suspension  and we had good elevation of the vaginal cuff.  The remaining vaginal mucosa was then reapproximated in interrupted sutures of 2-0 Vicryl.  Perineal body was rebuilt with 2-0 Vicryl and the skin over the perineal body was closed with running subcuticular  2-0 Vicryl.  We had good elevation, approximation and no active bleeding.  Cystoscopy was then performed.  There was no evidence of injury to the bladder.  Both ureteral orifices were identified and noted to be passing clear jets of urine.  Rectal exam was then performed, which was clear.  Vaginal packing was put in place and  the Foley was placed to straight drain.  At this point in time, the laparoscope was reintroduced.  Abdomen was reinflated with  carbon dioxide.  We thoroughly irrigated the pelvis.  We had excellent hemostasis of both ovarian vasculature areas as well as the vaginal cuff.  We removed the  irrigation.  Abdomen was deflated of carbon dioxide.  Laparoscope was removed.  Both trocars were removed.  Subumbilical incision was closed with interrupted subcuticulars of 4-0 Vicryl.  Suprapubic incision was closed with Dermabond.  Sponge, instrument  and needle count were correct by the circulating nurse.  The patient did tolerate the procedure well and was returned to recovery in good condition.  VN/NUANCE  D:08/27/2019 T:08/27/2019 JOB:[id number]

## 2019-08-27 NOTE — Anesthesia Preprocedure Evaluation (Addendum)
Anesthesia Evaluation  Patient identified by MRN, date of birth, ID band Patient awake    Reviewed: Allergy & Precautions, NPO status , Patient's Chart, lab work & pertinent test results  History of Anesthesia Complications Negative for: history of anesthetic complications  Airway Mallampati: I  TM Distance: >3 FB Neck ROM: Full    Dental no notable dental hx. (+) Teeth Intact   Pulmonary asthma , former smoker,    Pulmonary exam normal        Cardiovascular negative cardio ROS Normal cardiovascular exam     Neuro/Psych PSYCHIATRIC DISORDERS Anxiety Depression negative neurological ROS     GI/Hepatic Neg liver ROS, hiatal hernia, GERD  ,Crohn's disease   Endo/Other  negative endocrine ROS  Renal/GU negative Renal ROS  negative genitourinary   Musculoskeletal  (+) Fibromyalgia -  Abdominal   Peds  Hematology negative hematology ROS (+)   Anesthesia Other Findings Echo 2018: Normal LV size with EF 60-65%. Moderate diastolic dysfunction. Normal RV size and systolic function. No significant valvular abnormalities.  ETT 2018 normal  Reproductive/Obstetrics                          Anesthesia Physical Anesthesia Plan  ASA: II  Anesthesia Plan: General   Post-op Pain Management:    Induction: Intravenous  PONV Risk Score and Plan: 4 or greater and Ondansetron, Dexamethasone, Treatment may vary due to age or medical condition and Midazolam  Airway Management Planned: Oral ETT  Additional Equipment: None  Intra-op Plan:   Post-operative Plan: Extubation in OR  Informed Consent: I have reviewed the patients History and Physical, chart, labs and discussed the procedure including the risks, benefits and alternatives for the proposed anesthesia with the patient or authorized representative who has indicated his/her understanding and acceptance.     Dental advisory given  Plan  Discussed with:   Anesthesia Plan Comments:        Anesthesia Quick Evaluation

## 2019-08-27 NOTE — H&P (Signed)
  History and physical exam unchanged 

## 2019-08-27 NOTE — Progress Notes (Signed)
Patient ID: Jessica Bates, female   DOB: [birth year], 58 y.o.   MRN: [id number] AF VSS GOOD UP INC CLEAR MINIMAL VAGINAL BLEEDING

## 2019-08-27 NOTE — Anesthesia Postprocedure Evaluation (Signed)
Anesthesia Post Note  Patient: Jessica Bates  Procedure(s) Performed: LAPAROSCOPIC ASSISTED VAGINAL HYSTERECTOMY WITH SALPINGO OOPHORECTOMY (Bilateral Vagina ) ANTERIOR AND POSTERIOR REPAIR WITH SACROSPINOUS FIXATION (N/A )     Patient location during evaluation: PACU Anesthesia Type: General Level of consciousness: awake and alert Pain management: pain level controlled Vital Signs Assessment: post-procedure vital signs reviewed and stable Respiratory status: spontaneous breathing, nonlabored ventilation and respiratory function stable Cardiovascular status: blood pressure returned to baseline and stable Postop Assessment: no apparent nausea or vomiting Anesthetic complications: no    Last Vitals:  Vitals:   08/27/19 1117 08/27/19 1202  BP: 110/72 105/66  Pulse: 76 77  Resp: 10 17  Temp: 36.6 C 36.7 C  SpO2: 100% 100%    Last Pain:  Vitals:   08/27/19 1202  TempSrc:   PainSc: 4                  Lidia Collum

## 2019-08-28 DIAGNOSIS — K5090 Crohn's disease, unspecified, without complications: Secondary | ICD-10-CM | POA: Diagnosis present

## 2019-08-28 DIAGNOSIS — E860 Dehydration: Secondary | ICD-10-CM | POA: Diagnosis not present

## 2019-08-28 DIAGNOSIS — K219 Gastro-esophageal reflux disease without esophagitis: Secondary | ICD-10-CM | POA: Diagnosis present

## 2019-08-28 DIAGNOSIS — Z888 Allergy status to other drugs, medicaments and biological substances status: Secondary | ICD-10-CM | POA: Diagnosis not present

## 2019-08-28 DIAGNOSIS — N8110 Cystocele, unspecified: Secondary | ICD-10-CM | POA: Diagnosis present

## 2019-08-28 DIAGNOSIS — Z85820 Personal history of malignant melanoma of skin: Secondary | ICD-10-CM | POA: Diagnosis not present

## 2019-08-28 DIAGNOSIS — M797 Fibromyalgia: Secondary | ICD-10-CM | POA: Diagnosis present

## 2019-08-28 DIAGNOSIS — N816 Rectocele: Secondary | ICD-10-CM | POA: Diagnosis present

## 2019-08-28 DIAGNOSIS — K449 Diaphragmatic hernia without obstruction or gangrene: Secondary | ICD-10-CM | POA: Diagnosis present

## 2019-08-28 DIAGNOSIS — N8189 Other female genital prolapse: Secondary | ICD-10-CM | POA: Diagnosis present

## 2019-08-28 DIAGNOSIS — Z880 Allergy status to penicillin: Secondary | ICD-10-CM | POA: Diagnosis not present

## 2019-08-28 DIAGNOSIS — Z79899 Other long term (current) drug therapy: Secondary | ICD-10-CM | POA: Diagnosis not present

## 2019-08-28 DIAGNOSIS — Z7951 Long term (current) use of inhaled steroids: Secondary | ICD-10-CM | POA: Diagnosis not present

## 2019-08-28 DIAGNOSIS — Z79890 Hormone replacement therapy (postmenopausal): Secondary | ICD-10-CM | POA: Diagnosis not present

## 2019-08-28 DIAGNOSIS — Z20828 Contact with and (suspected) exposure to other viral communicable diseases: Secondary | ICD-10-CM | POA: Diagnosis present

## 2019-08-28 DIAGNOSIS — R339 Retention of urine, unspecified: Secondary | ICD-10-CM | POA: Diagnosis not present

## 2019-08-28 DIAGNOSIS — Z90710 Acquired absence of both cervix and uterus: Secondary | ICD-10-CM | POA: Diagnosis present

## 2019-08-28 LAB — CBC
HCT: 34.5 % — ABNORMAL LOW (ref 36.0–46.0)
Hemoglobin: 10.7 g/dL — ABNORMAL LOW (ref 12.0–15.0)
MCH: 31.9 pg (ref 26.0–34.0)
MCHC: 31 g/dL (ref 30.0–36.0)
MCV: 103 fL — ABNORMAL HIGH (ref 80.0–100.0)
Platelets: 257 10*3/uL (ref 150–400)
RBC: 3.35 MIL/uL — ABNORMAL LOW (ref 3.87–5.11)
RDW: 11.7 % (ref 11.5–15.5)
WBC: 11.6 10*3/uL — ABNORMAL HIGH (ref 4.0–10.5)
nRBC: 0 % (ref 0.0–0.2)

## 2019-08-28 MED ORDER — PROMETHAZINE HCL 25 MG/ML IJ SOLN: 12.5000 mg | Freq: Three times a day (TID) | INTRAMUSCULAR | Status: DC | PRN

## 2019-08-28 MED ORDER — ONDANSETRON HCL 4 MG/2ML IJ SOLN
INTRAMUSCULAR | Status: AC
  Filled 2019-08-28: qty 2

## 2019-08-28 MED ORDER — LACTATED RINGERS IV BOLUS
500.0000 mL | Freq: Once | INTRAVENOUS | Status: AC
  Administered 2019-08-28: 500 mL via INTRAVENOUS
  Filled 2019-08-28: qty 500

## 2019-08-28 MED ORDER — KETOROLAC TROMETHAMINE 30 MG/ML IJ SOLN
30.0000 mg | Freq: Once | INTRAMUSCULAR | Status: AC
  Administered 2019-08-28: 30 mg via INTRAVENOUS
  Filled 2019-08-28: qty 1

## 2019-08-28 MED ORDER — CHLORHEXIDINE GLUCONATE CLOTH 2 % EX PADS
6.0000 | MEDICATED_PAD | Freq: Every day | CUTANEOUS | Status: DC
  Administered 2019-08-28: 6 via TOPICAL

## 2019-08-28 MED ORDER — ONDANSETRON HCL 4 MG/2ML IJ SOLN
4.0000 mg | Freq: Once | INTRAMUSCULAR | Status: AC
  Filled 2019-08-28: qty 2

## 2019-08-28 MED ORDER — ACETAMINOPHEN 325 MG PO TABS
650.0000 mg | ORAL_TABLET | Freq: Four times a day (QID) | ORAL | Status: DC | PRN
  Administered 2019-08-28 (×2): 650 mg via ORAL
  Filled 2019-08-28 (×2): qty 2

## 2019-08-28 MED ORDER — KETOROLAC TROMETHAMINE 30 MG/ML IJ SOLN
INTRAMUSCULAR | Status: AC
  Filled 2019-08-28: qty 1

## 2019-08-28 MED ORDER — ZOLPIDEM TARTRATE 10 MG PO TABS: 5.0000 mg | ORAL_TABLET | Freq: Every evening | ORAL | Status: DC | PRN

## 2019-08-28 MED ORDER — TRAMADOL HCL 50 MG PO TABS
50.0000 mg | ORAL_TABLET | Freq: Four times a day (QID) | ORAL | Status: DC
  Administered 2019-08-28 – 2019-08-29 (×4): 50 mg via ORAL
  Filled 2019-08-28 (×4): qty 1

## 2019-08-28 MED ORDER — HYDROCODONE-ACETAMINOPHEN 5-325 MG PO TABS
ORAL_TABLET | ORAL | Status: AC
  Filled 2019-08-28: qty 2

## 2019-08-28 MED ORDER — PROMETHAZINE HCL 25 MG/ML IJ SOLN
12.5000 mg | Freq: Once | INTRAMUSCULAR | Status: AC
  Administered 2019-08-28: 12.5 mg via INTRAVENOUS
  Filled 2019-08-28: qty 1

## 2019-08-28 MED ORDER — TRAMADOL HCL 50 MG PO TABS
ORAL_TABLET | ORAL | Status: AC
  Filled 2019-08-28: qty 1

## 2019-08-28 NOTE — Progress Notes (Signed)
Vaginal packing removed for moderate amt of sanguinous drainage on gauze.   Foley d/c at 0600. Pt given water and juice to drink. Tolerated both procedure well

## 2019-08-28 NOTE — Progress Notes (Signed)
Patient ID: Jessica Bates, female   DOB: [birth year], 58 y.o.   MRN: [id number] Feeling much better tolerating diet Af vss abd soft normal bs Good uo  Continue hydratrion

## 2019-08-28 NOTE — Progress Notes (Signed)
Pt feeling better denies n/v.  Going to rest for now and eat later.

## 2019-08-28 NOTE — Progress Notes (Signed)
1 Day Post-Op Procedure(s) (LRB): LAPAROSCOPIC ASSISTED VAGINAL HYSTERECTOMY WITH SALPINGO OOPHORECTOMY (Bilateral) ANTERIOR AND POSTERIOR REPAIR WITH SACROSPINOUS FIXATION (N/A)  Subjective: Patient reports nausea.    Objective: I have reviewed patient's vital signs and labs.  General: c/o of nausea  GI: soft, non-tender; bowel sounds normal; no masses,  no organomegaly Vaginal Bleeding: minimal  Assessment: s/p Procedure(s) with comments: LAPAROSCOPIC ASSISTED VAGINAL HYSTERECTOMY WITH SALPINGO OOPHORECTOMY (Bilateral) - NEED BED ANTERIOR AND POSTERIOR REPAIR WITH SACROSPINOUS FIXATION (N/A): not tolerating diet and urinary retention  Plan: Continue foley due to acute urinary retention hydrate.  Stop percocet and start tramadol,  LOS: 0 days    Arvella Nigh 08/28/2019, 10:33 AM

## 2019-08-28 NOTE — Progress Notes (Signed)
Report called to nurse on 3W. Patient transferred to 3W via wheelchair with husband.

## 2019-08-28 NOTE — Progress Notes (Signed)
Dilaudid PCA d/c 20 cc wasted, Carolyn Stare RN witnessed waste.

## 2019-08-28 NOTE — Progress Notes (Signed)
Patient c/o continued nausea. When attempting to go ambulate to BR, patient became dizzy and nauseated and had dry heaving. Also was unable to void when sitting on toilet. C/o 7/10 abdominal pain. Dr. Radene Knee at bedside, orders obtained for I&O cath. I&O cath performed using sterile technique, only 45 ml amber/concentrated urine obtained. Dr. Radene Knee notified, new orders obtained for foley catheter, IV bolus, IV zofran, IV toradol, ultram PO, D/C hydrocodone, and admit patient to med/surg. Notified patient, will continue to monitor. VSS.

## 2019-08-29 LAB — SURGICAL PATHOLOGY

## 2019-08-29 MED ORDER — TRAMADOL HCL 50 MG PO TABS: 50.0000 mg | ORAL_TABLET | Freq: Four times a day (QID) | ORAL | 0 refills | Status: AC | PRN

## 2019-08-29 NOTE — Discharge Summary (Signed)
Patient name Jessica Bates, Jessica Bates DICTATION# [id number] CSN# [id number]  Arvella Nigh, MD 08/29/2019 8:15 AM

## 2019-08-29 NOTE — Progress Notes (Signed)
2 Days Post-Op Procedure(s) (LRB): LAPAROSCOPIC ASSISTED VAGINAL HYSTERECTOMY WITH SALPINGO OOPHORECTOMY (Bilateral) ANTERIOR AND POSTERIOR REPAIR WITH SACROSPINOUS FIXATION (N/A)  Subjective: Patient reports tolerating PO.    Objective: I have reviewed patient's vital signs, intake and output and labs.  General: alert GI: soft, non-tender; bowel sounds normal; no masses,  no organomegaly Vaginal Bleeding: minimal  Assessment: s/p Procedure(s) with comments: LAPAROSCOPIC ASSISTED VAGINAL HYSTERECTOMY WITH SALPINGO OOPHORECTOMY (Bilateral) - NEED BED ANTERIOR AND POSTERIOR REPAIR WITH SACROSPINOUS FIXATION (N/A): stable  Plan: Discharge home  LOS: 1 day    [place] 08/29/2019, 8:11 AM

## 2019-08-29 NOTE — Plan of Care (Signed)
Patient discharged home in stable condition 

## 2019-08-29 NOTE — Discharge Summary (Signed)
NAME: Jessica Bates, Jessica Bates MEDICAL RECORD AP:[id number] ACCOUNT [id number] DATE OF BIRTH:[birth year] FAC[facility]: [facility] PHYSICIAN:Sandrina Heaton Sherran Needs, MD  DISCHARGE SUMMARY  DATE OF DISCHARGE:  08/29/2019  ADMITTING DIAGNOSIS:  Symptomatic pelvic relaxation.  POSTOPERATIVE DIAGNOSIS:  Symptomatic pelvic relaxation.  OPERATIVE PROCEDURE:  Laparoscopically assisted vaginal hysterectomy with removal of both tubes and ovaries.  Anterior and posterior repair.  Sacrospinous ligament suspension.  Cystoscopy.    For complete history and physical, please see dictated note.  HOSPITAL COURSE:  The patient underwent the above noted surgery.  Her postoperative hemoglobin was 10.7.  On her first postoperative day, she did develop some nausea and instability.  She was unable to void.  We felt this was dehydration.  We kept her  that day and overnight.  We increased her hydration, put a Foley to straight drain, and changed her pain medicines from Percocet to tramadol.  With this, she did much better.  By that evening, she was eating without difficulty.  The next morning, she was  feeling much better, tolerating her diet and was going to try to void at that point in time.  COMPLICATIONS:  None encountered.  The patient was discharged home in stable condition.  DISPOSITION:  The patient is to avoid heavy lifting or vaginal entrance.  She should not drive a car until stable.  She is instructed to call should there be heavy bleeding.  Any fever should be reported.  Nausea and vomiting should be reported.   Excessive pain should be reported.  She is instructed in signs and symptoms of deep venous thrombosis and pulmonary embolus.  She will be discharged home with tramadol as needed for pain.  Planned follow up in the office in 1 week.  CN/NUANCE D:08/29/2019 T:08/29/2019 JOB:[id number]

## 2019-09-10 DIAGNOSIS — D509 Iron deficiency anemia, unspecified: Secondary | ICD-10-CM | POA: Diagnosis not present

## 2019-09-10 DIAGNOSIS — Z13820 Encounter for screening for osteoporosis: Secondary | ICD-10-CM | POA: Diagnosis not present

## 2019-10-05 DIAGNOSIS — E7800 Pure hypercholesterolemia, unspecified: Secondary | ICD-10-CM | POA: Diagnosis not present

## 2019-10-05 DIAGNOSIS — J449 Chronic obstructive pulmonary disease, unspecified: Secondary | ICD-10-CM | POA: Diagnosis not present

## 2019-10-05 DIAGNOSIS — M797 Fibromyalgia: Secondary | ICD-10-CM | POA: Diagnosis not present

## 2019-10-05 DIAGNOSIS — K5090 Crohn's disease, unspecified, without complications: Secondary | ICD-10-CM | POA: Diagnosis not present

## 2019-10-15 DIAGNOSIS — M1811 Unilateral primary osteoarthritis of first carpometacarpal joint, right hand: Secondary | ICD-10-CM | POA: Diagnosis not present

## 2019-10-27 ENCOUNTER — Ambulatory Visit: Payer: Self-pay

## 2019-11-28 DIAGNOSIS — Z79899 Other long term (current) drug therapy: Secondary | ICD-10-CM | POA: Diagnosis not present

## 2019-11-28 DIAGNOSIS — E7800 Pure hypercholesterolemia, unspecified: Secondary | ICD-10-CM | POA: Diagnosis not present

## 2019-11-28 DIAGNOSIS — F411 Generalized anxiety disorder: Secondary | ICD-10-CM | POA: Diagnosis not present

## 2019-12-06 DIAGNOSIS — E7800 Pure hypercholesterolemia, unspecified: Secondary | ICD-10-CM | POA: Diagnosis not present

## 2019-12-06 DIAGNOSIS — K5090 Crohn's disease, unspecified, without complications: Secondary | ICD-10-CM | POA: Diagnosis not present

## 2019-12-06 DIAGNOSIS — J449 Chronic obstructive pulmonary disease, unspecified: Secondary | ICD-10-CM | POA: Diagnosis not present

## 2019-12-06 DIAGNOSIS — Z85820 Personal history of malignant melanoma of skin: Secondary | ICD-10-CM | POA: Diagnosis not present

## 2019-12-10 DIAGNOSIS — F410 Panic disorder [episodic paroxysmal anxiety] without agoraphobia: Secondary | ICD-10-CM | POA: Diagnosis not present

## 2020-01-02 ENCOUNTER — Encounter: Payer: Self-pay | Admitting: General Practice

## 2020-01-07 DIAGNOSIS — R309 Painful micturition, unspecified: Secondary | ICD-10-CM | POA: Diagnosis not present

## 2020-01-07 DIAGNOSIS — R300 Dysuria: Secondary | ICD-10-CM | POA: Diagnosis not present

## 2020-02-07 DIAGNOSIS — M1811 Unilateral primary osteoarthritis of first carpometacarpal joint, right hand: Secondary | ICD-10-CM | POA: Diagnosis not present

## 2020-02-18 ENCOUNTER — Other Ambulatory Visit: Payer: Self-pay | Admitting: Adult Health

## 2020-02-19 ENCOUNTER — Telehealth: Payer: Self-pay | Admitting: Internal Medicine

## 2020-02-19 MED ORDER — BUDESONIDE-FORMOTEROL FUMARATE 80-4.5 MCG/ACT IN AERO: INHALATION_SPRAY | RESPIRATORY_TRACT | 0 refills | Status: DC

## 2020-02-19 NOTE — Telephone Encounter (Signed)
Spoke with pt. She is needing a refill on Symbicort. Pt is scheduled for an appointment with Beth later this month. Rx has been sent in. Nothing further was needed.

## 2020-02-20 ENCOUNTER — Encounter: Payer: Self-pay | Admitting: Physical Therapy

## 2020-02-20 ENCOUNTER — Ambulatory Visit: Payer: BC Managed Care – PPO | Attending: Obstetrics and Gynecology | Admitting: Physical Therapy

## 2020-02-20 ENCOUNTER — Other Ambulatory Visit: Payer: Self-pay

## 2020-02-20 DIAGNOSIS — R278 Other lack of coordination: Secondary | ICD-10-CM | POA: Insufficient documentation

## 2020-02-20 DIAGNOSIS — M62838 Other muscle spasm: Secondary | ICD-10-CM | POA: Diagnosis not present

## 2020-02-20 DIAGNOSIS — M6281 Muscle weakness (generalized): Secondary | ICD-10-CM

## 2020-02-20 NOTE — Therapy (Signed)
[facility] [address], [place], [place], [address] Phone: [phone]   Fax:  [phone]  Physical Therapy Evaluation  Patient Details  Name: Jessica Bates MRN: [id number] Date of Birth: November 15, 1960 Referring Provider (PT): Dr. Arvella Nigh   Encounter Date: 02/20/2020  PT End of Session - 02/20/20 1029    Visit Number  1    Date for PT Re-Evaluation  05/14/20    Authorization Type  [employer]    Authorization - Visit Number  1    Authorization - Number of Visits  90    PT Start Time  0800    PT Stop Time  0845    PT Time Calculation (min)  45 min    Activity Tolerance  Patient tolerated treatment well    Behavior During Therapy  [facility] for tasks assessed/performed       Past Medical History:  Diagnosis Date  . Asthma    bronchiectesis  . Crohn disease ([place])   . Depression meopause  . Dyslipidemia   . Fibromyalgia   . GERD (gastroesophageal reflux disease)   . Hemorrhoids   . Hiatal hernia   . Melanoma ([place])   . Pulmonary nodules     Past Surgical History:  Procedure Laterality Date  . ANTERIOR AND POSTERIOR REPAIR WITH SACROSPINOUS FIXATION N/A 08/27/2019   Procedure: ANTERIOR AND POSTERIOR REPAIR WITH SACROSPINOUS FIXATION;  Surgeon: Arvella Nigh, MD;  Location: [place];  Service: Gynecology;  Laterality: N/A;  . colonscopy    . LAPAROSCOPIC VAGINAL HYSTERECTOMY WITH SALPINGO OOPHORECTOMY Bilateral 08/27/2019   Procedure: LAPAROSCOPIC ASSISTED VAGINAL HYSTERECTOMY WITH SALPINGO OOPHORECTOMY;  Surgeon: Arvella Nigh, MD;  Location: [place];  Service: Gynecology;  Laterality: Bilateral;  NEED BED  . MELANOMA EXCISION      There were no vitals filed for this visit.   Subjective Assessment - 02/20/20 0808    Subjective  Last summer had pain and discomfort with burning. Felt like something is coming out. Patient had a hysterectomy last December. I still feel a bulge and it  is not as bad. After sex I have increased burning by the introitus.    Patient Stated Goals  work on pelvic floor strength    Currently in Pain?  Yes    Pain Score  5     Pain Location  Vagina    Pain Orientation  Mid    Pain Descriptors / Indicators  Burning   feels like a little bladder infection   Pain Type  Acute pain    Pain Onset  More than a month ago    Pain Frequency  Intermittent    Aggravating Factors   with intercourse, can just come for no reason    Pain Relieving Factors  nothing         [facility] PT Assessment - 02/20/20 0001      Assessment   Medical Diagnosis  N94.10 Unspedified dyspareunia    Referring Provider (PT)  Dr. Arvella Nigh    Onset Date/Surgical Date  05/23/19    Prior Therapy  none      Precautions   Precautions  None      Restrictions   Weight Bearing Restrictions  No      Balance Screen   Has the patient fallen in the past 6 months  No    Has the patient had a decrease in activity level because of a fear of falling?   No  Is the patient reluctant to leave their home because of a fear of falling?   No      Home Film/video editor residence      Prior Function   Level of Independence  Independent    Vocation  Full time employment    Vocation Requirements  sitting    Leisure  walk, barre workout      Cognition   Overall Cognitive Status  Within Functional Limits for tasks assessed      Posture/Postural Control   Posture/Postural Control  No significant limitations      ROM / Strength   AROM / PROM / Strength  AROM;PROM;Strength      AROM   Lumbar Flexion  full-increased muscle on left    Lumbar Extension  full    Lumbar - Right Side Bend  decreased by 25% no extension    Lumbar - Left Side Bend  decreased by 25% with flexion    Lumbar - Right Rotation  decreased by 25%    Lumbar - Left Rotation  decreased 25%      PROM   Right Hip Flexion  115    Right Hip Internal Rotation   15    Left Hip Internal  Rotation   20      Strength   Right Hip ABduction  3+/5    Left Hip Extension  4/5    Left Hip ABduction  4-/5      Palpation   Spinal mobility  decreased movement of T5-L5    SI assessment   left ilium rotated posteriorly    Palpation comment  tightness in the left lateral abdomen                  Objective measurements completed on examination: See above findings.    Pelvic Floor Special Questions - 02/20/20 0001    Prior Pregnancies  Yes    Number of Pregnancies  4   1 miscarriage   Number of Vaginal Deliveries  3    Any difficulty with labor and deliveries  No    Currently Sexually Active  Yes    Is this Painful  Yes   happens afterwards   Urinary Leakage  No    Urinary urgency  Yes   slightly   Fecal incontinence  No   constipation and straining   Falling out feeling (prolapse)  Yes    Activities that cause feeling of prolapse  lifting    External Perineal Exam  hood is adhered to the clotorus    Prolapse  Anterior Wall    Pelvic Floor Internal Exam  Patient confirms identification and approves PT to assess pelvic floor and treatment    Exam Type  Vaginal    Palpation  tightness in the perineal body, along the scar, urethra sphinter    Strength  weak squeeze, no lift       [facility] Adult PT Treatment/Exercise - 02/20/20 0001      Self-Care   Self-Care  Other Self-Care Comments    Other Self-Care Comments   education on vaginal moisturizers and how they can improve health of vaginal tissue; education on the pelvic and pelvic floor anatomy             PT Education - 02/20/20 0931    Education Details  instruction on vaginal moisturizers    Person(s) Educated  Patient    Methods  Explanation;Handout    Comprehension  Verbalized understanding       PT Short Term Goals - 02/20/20 1041      PT SHORT TERM GOAL #1   Title  independent with initial HEP    Time  4    Period  Weeks    Status  New    Target Date  03/19/20      PT SHORT TERM  GOAL #2   Title  educated on vaginal moisturizers and lubricants for healthy vaginal tissue    Time  4    Period  Weeks    Status  New    Target Date  03/19/20      PT SHORT TERM GOAL #3   Title  education on abdominal massage to reduce constipation    Time  4    Period  Weeks    Status  New    Target Date  03/19/20        PT Long Term Goals - 02/20/20 1042      PT LONG TERM GOAL #1   Title  independent with advanced HEP    Time  12    Period  Weeks    Status  New    Target Date  05/14/20      PT LONG TERM GOAL #2   Title  burning vaginally after intercourse decreaesd >/= 75% due to elongation of tissue    Time  12    Period  Weeks    Status  New    Target Date  05/14/20      PT LONG TERM GOAL #3   Title  understand correct toileting so she is not straining and putting pressure on the bladder    Time  12    Period  Weeks    Status  New    Target Date  05/14/20      PT LONG TERM GOAL #4   Title  vaginal buring with lifting decreased >/= 75%    Time  12    Period  Weeks    Status  New    Target Date  05/14/20             Plan - 02/20/20 1031    Clinical Impression Statement  Patient is a 59 year old female with dysparaeunia since last year. Patient had a laparoscopic vaginal hysterectomy with salpingo oophorectomy on 1214/2021. Patient reports vaginal burning pain after intercourse and lifting items. Lumbar ROM is limited by 25% with tightness in the lumbar vertebrae. Internal rotation P/ROM on the left is 20 degrees and right is 15 degrees. Right hip strength for abduction is 3+/5 and extension is 4/5. Left ilium is rotated posteriorly and decreased mobiltiy of T5-L5 Tightness in the lateral abdominal, perineal body, and urethra sphincter.  Clitoral hood is adhered to the clitorus. Pelvic floor strength is 2/5. She presents with a grade 1 anterior wall laxity when bearing down. Patient reports constipation and has to bear down to strain. Patient will benefit  from skilled therapy to improve strength, improve pelvic floor coordination, and reduce straining on the pelvic floor.    Personal Factors and Comorbidities  Comorbidity 3+;Sex    Comorbidities  fibromyalgia; melanoma; Chron's disease; s/p laparoscopic Vaginal Hysterectomy with Salpingo Oophorectomy on 08/27/2019    Examination-Activity Limitations  Toileting;Lift    Examination-Participation Restrictions  Interpersonal Relationship    Stability/Clinical Decision Making  Evolving/Moderate complexity    Clinical Decision Making  Moderate    Rehab Potential  Excellent  PT Frequency  1x / week    PT Duration  12 weeks    PT Treatment/Interventions  ADLs/Self Care Home Management;Biofeedback;Cryotherapy;Moist Heat;Neuromuscular re-education;Therapeutic exercise;Therapeutic activities;Patient/family education;Manual techniques;Dry needling;Scar mobilization;Taping;Spinal Manipulations    PT Next Visit Plan  see if she is working on vaginal moisturizer, education on lubricants; soft tissue work to left lateral abdominal, circular abdominal massage, internal soft tissue work to urethra sphincer and perineal body, check pelvic alignment; toileting, bulging of the pelvic floor    Consulted and Agree with Plan of Care  Patient       Patient will benefit from skilled therapeutic intervention in order to improve the following deficits and impairments:  Decreased coordination, Decreased range of motion, Increased fascial restricitons, Pain, Decreased activity tolerance, Decreased strength, Decreased scar mobility  Visit Diagnosis: Muscle weakness (generalized) - Plan: PT plan of care cert/re-cert  Other lack of coordination - Plan: PT plan of care cert/re-cert  Other muscle spasm - Plan: PT plan of care cert/re-cert     Problem List Patient Active Problem List   Diagnosis Date Noted  . S/P laparoscopic hysterectomy 08/28/2019  . Pelvic relaxation 08/27/2019  . S/P laparoscopic assisted vaginal  hysterectomy ([facility]) 08/27/2019  . Flu-like symptoms 10/03/2018  . Sinusitis chronic, frontal 09/22/2018  . Bronchiectasis without complication ([place]) 89/79/1504  . Pulmonary nodule, right 07/11/2018  . Chest tightness 06/13/2017  . Fatigue 06/13/2017  . Chronic cough 05/31/2016    Earlie Counts, PT 02/20/20 10:46 AM    [facility] [address], [place], [place], [address] Phone: [phone]   Fax:  [phone]  Name: Jessica Bates MRN: [id number] Date of Birth: 1961-07-08

## 2020-02-20 NOTE — Patient Instructions (Signed)
Moisturizers . They are used in the vagina to hydrate the mucous membrane that make up the vaginal canal. . Designed to keep a more normal acid balance (ph) . Once placed in the vagina, it will last between two to three days.  . Use 2-3 times per week at bedtime  . Ingredients to avoid is glycerin and fragrance, can increase chance of infection . Should not be used just before sex due to causing irritation . Most are gels administered either in a tampon-shaped applicator or as a vaginal suppository. They are non-hormonal.   Types of Moisturizers  . Vitamin E vaginal suppositories- Whole foods, Amazon . Moist Again . Coconut oil- can break down condoms . Julva- (Do no use if on Tamoxifen) amazon . Yes moisturizer- amazon . NeuEve Silk , NeuEve Silver for menopausal or over 65 (if have severe vaginal atrophy or cancer treatments use NeuEve Silk for  1 month than move to [employer])- [employer], MapleFlower.dk . Olive and Bee intimate cream- www.oliveandbee.com.au . Mae vaginal [place] . Aloe .    Creams to use externally on the Vulva area  Albertson's (good for for cancer patients that had radiation to the area)- [place] or [employer].FlyingBasics.com.br  V-magic cream - amazon  Julva-amazon  Vital "V Wild Yam salve ( help moisturize and help with thinning vulvar area, does have [place] by Irwin Brakeman labial moisturizer ([place],   Coconut or olive oil  aloe   Things to avoid in the vaginal area . Do not use things to irritate the vulvar area . No lotions just specialized creams for the vulva area- Neogyn, V-magic, No soaps; can use Aveeno or Calendula cleanser if needed. Must be gentle . No deodorants . No douches . Good to sleep without underwear to let the vaginal area to air out . No scrubbing: spread the lips to let warm water rinse over labias and pat dry  [facility] [address], [place], [place] [address] Phone # [phone] Fax [phone]

## 2020-03-04 NOTE — Progress Notes (Signed)
Chief Complaint  Patient presents with  . Follow-up    Chest pain   History of Present Illness: 59 yo female with history of former tobacco abuse, hyperlipidemia, asthma, fibromyalgia, Crohn disease who is here today for cardiac follow up. I saw her in October 2018 as a new consult, referred by Dr. Rex Kras, for evaluation of chest pain. She has been followed in the pulmonary office following several bouts of pneumonia. She also has pulmonary nodules. She denied any prior cardiac issues. She described pressure in her chest, mostly at rest. No change with exertion. This felt like her chest pressure a/w her prior pneumonia. No associated nausea or diaphoresis. The chest pressure can last for hours. Occur several times per week. She does have GERD and a hiatal hernia. She exercises and has no chest pain with exercise. She smoked for about 15 years, stopping in 2000. No LE edema, dizziness, palpitations. Echo in November 2018 with LVEF=60-65% and no significant valve disease. Normal exercise stress test in November 2018.   She is here today for follow up. The patient denies any chest pain, dyspnea, palpitations, lower extremity edema, orthopnea, PND, dizziness, near syncope or syncope.   Primary Care Physician: Hulan Fess, MD  Past Medical History:  Diagnosis Date  . Asthma    bronchiectesis  . Crohn disease ([place])   . Depression meopause  . Dyslipidemia   . Fibromyalgia   . GERD (gastroesophageal reflux disease)   . Hemorrhoids   . Hiatal hernia   . Melanoma ([place])   . Pulmonary nodules     Past Surgical History:  Procedure Laterality Date  . ANTERIOR AND POSTERIOR REPAIR WITH SACROSPINOUS FIXATION N/A 08/27/2019   Procedure: ANTERIOR AND POSTERIOR REPAIR WITH SACROSPINOUS FIXATION;  Surgeon: Arvella Nigh, MD;  Location: [facility];  Service: Gynecology;  Laterality: N/A;  . colonscopy    . LAPAROSCOPIC VAGINAL HYSTERECTOMY WITH SALPINGO OOPHORECTOMY Bilateral  08/27/2019   Procedure: LAPAROSCOPIC ASSISTED VAGINAL HYSTERECTOMY WITH SALPINGO OOPHORECTOMY;  Surgeon: Arvella Nigh, MD;  Location: [facility];  Service: Gynecology;  Laterality: Bilateral;  NEED BED  . MELANOMA EXCISION      Current Outpatient Medications  Medication Sig Dispense Refill  . acyclovir (ZOVIRAX) 400 MG tablet Take 400 mg by mouth daily.    Marland Kitchen ALPRAZolam (XANAX) 0.25 MG tablet Take 0.125 mg by mouth daily as needed for anxiety.     Marland Kitchen aspirin-acetaminophen-caffeine (EXCEDRIN MIGRAINE) 250-250-65 MG tablet Take 2 tablets by mouth every 6 (six) hours as needed for headache.    Marland Kitchen atorvastatin (LIPITOR) 40 MG tablet Take 40 mg by mouth at bedtime.     . budesonide-formoterol (SYMBICORT) 80-4.5 MCG/ACT inhaler TAKE 2 PUFFS BY MOUTH TWICE A DAY 1 Inhaler 0  . cetirizine (ZYRTEC) 10 MG tablet Take 10 mg by mouth daily.    . cholecalciferol (VITAMIN D3) 25 MCG (1000 UT) tablet Take 1,000 Units by mouth daily.    Marland Kitchen conjugated estrogens (PREMARIN) vaginal cream Place 1 Applicatorful vaginally daily.    Marland Kitchen estradiol (VIVELLE-DOT) 0.05 MG/24HR patch Place 1 patch onto the skin 2 (two) times a week.    . mesalamine (APRISO) 0.375 g 24 hr capsule Take 750 mg by mouth daily.     . methocarbamol (ROBAXIN) 500 MG tablet Take 500 mg by mouth daily as needed (headaches).   1  . montelukast (SINGULAIR) 10 MG tablet Take 10 mg by mouth at bedtime.   5  . naproxen sodium (ALEVE) 220 MG  tablet Take 220 mg by mouth 2 (two) times daily as needed (pain).    Marland Kitchen omeprazole (PRILOSEC) 20 MG capsule Take 20 mg by mouth daily as needed (acid reflux).    Marland Kitchen Respiratory Therapy Supplies (FLUTTER) DEVI Use flutter valve 3 times a day 1 each 0  . Zinc 30 MG CAPS Take 30 mg by mouth daily.    Marland Kitchen zolpidem (AMBIEN) 10 MG tablet Take 10 mg by mouth at bedtime.     No current facility-administered medications for this visit.    Allergies  Allergen Reactions  . Cymbalta [Duloxetine Hcl]     flushing   . Remicade [Infliximab]     Flushing  . Penicillins Rash    Did it involve swelling of the face/tongue/throat, SOB, or low BP? No Did it involve sudden or severe rash/hives, skin peeling, or any reaction on the inside of your mouth or nose? No Did you need to seek medical attention at a hospital or doctor's office? No When did it last happen?30 + years If all above answers are "NO", may proceed with cephalosporin use.     Social History   Socioeconomic History  . Marital status: Married    Spouse name: Not on file  . Number of children: 3  . Years of education: Not on file  . Highest education level: Not on file  Occupational History  . Occupation: Works at [employer]  . Smoking status: Former Smoker    Packs/day: 1.00    Years: 9.00    Pack years: 9.00    Types: Cigarettes    Quit date: 09/13/1998    Years since quitting: 21.4  . Smokeless tobacco: Never Used  Vaping Use  . Vaping Use: Never used  Substance and Sexual Activity  . Alcohol use: Yes    Comment: occasional  . Drug use: No  . Sexual activity: Not on file  Other Topics Concern  . Not on file  Social History Narrative   Assistant [employer]    Social Determinants of Health   Financial Resource Strain:   . Difficulty of Paying Living Expenses:   Food Insecurity:   . Worried About Charity fundraiser in the Last Year:   . Arboriculturist in the Last Year:   Transportation Needs:   . Film/video editor (Medical):   Marland Kitchen Lack of Transportation (Non-Medical):   Physical Activity:   . Days of Exercise per Week:   . Minutes of Exercise per Session:   Stress:   . Feeling of Stress :   Social Connections:   . Frequency of Communication with Friends and Family:   . Frequency of Social Gatherings with Friends and Family:   . Attends Religious Services:   . Active Member of Clubs or Organizations:   . Attends Archivist Meetings:   Marland Kitchen Marital Status:    Intimate Partner Violence:   . Fear of Current or Ex-Partner:   . Emotionally Abused:   Marland Kitchen Physically Abused:   . Sexually Abused:     Family History  Problem Relation Age of Onset  . Leukemia Father   . CAD Father   . Crohn's disease Brother     Review of Systems:  As stated in the HPI and otherwise negative.   BP 110/70   Pulse 67   Ht 5' 6"  (1.676 m)   Wt 124 lb 12.8 oz (56.6 kg)   LMP 02/14/2014 Comment: bcp//A.C.  SpO2 99%   BMI 20.14 kg/m   Physical Examination: General: Well developed, well nourished, NAD  HEENT: OP clear, mucus membranes moist  SKIN: warm, dry. No rashes. Neuro: No focal deficits  Musculoskeletal: Muscle strength 5/5 all ext  Psychiatric: Mood and affect normal  Neck: No JVD, no carotid bruits, no thyromegaly, no lymphadenopathy.  Lungs:Clear bilaterally, no wheezes, rhonci, crackles Cardiovascular: Regular rate and rhythm. No murmurs, gallops or rubs. Abdomen:Soft. Bowel sounds present. Non-tender.  Extremities: No lower extremity edema. Pulses are 2 + in the bilateral DP/PT.  EKG:  EKG is ordered today. The EKG is personally reviewed NSR  Recent Labs: 08/23/2019: BUN 12; Creatinine, Ser 0.55; Potassium 4.0; Sodium 139 08/28/2019: Hemoglobin 10.7; Platelets 257   Lipid Panel No results found for: CHOL, TRIG, HDL, CHOLHDL, VLDL, LDLCALC, LDLDIRECT   Wt Readings from Last 3 Encounters:  03/05/20 124 lb 12.8 oz (56.6 kg)  08/27/19 127 lb 14.4 oz (58 kg)  08/23/19 129 lb (58.5 kg)     Other studies Reviewed: Additional studies/ records that were reviewed today include: . Review of the above records demonstrates:    Assessment and Plan:   1. Chest pain: No recurrence over the past few years. Normal stress test in 2018. Normal LVEF by echo in 2018. EKG normal today.   Current medicines are reviewed at length with the patient today.  The patient does not have concerns regarding medicines.  The following changes have been made:  no  change  Labs/ tests ordered today include:   Orders Placed This Encounter  Procedures  . EKG 12-Lead   Disposition:   FU with me in 24 months   Signed, Lauree Chandler, MD 03/05/2020 9:26 AM    [facility] [place], [place], [place]  [address] Phone: [phone]; Fax: [phone]

## 2020-03-05 ENCOUNTER — Other Ambulatory Visit: Payer: Self-pay

## 2020-03-05 ENCOUNTER — Ambulatory Visit: Payer: BC Managed Care – PPO | Admitting: Cardiovascular Disease

## 2020-03-05 ENCOUNTER — Encounter: Payer: Self-pay | Admitting: Cardiovascular Disease

## 2020-03-05 VITALS — BP 110/70 | HR 67 | Ht 66.0 in | Wt 124.8 lb

## 2020-03-05 DIAGNOSIS — R079 Chest pain, unspecified: Secondary | ICD-10-CM

## 2020-03-05 NOTE — Patient Instructions (Signed)
Medication Instructions:  No changes *If you need a refill on your cardiac medications before your next appointment, please call your pharmacy*   Lab Work: none If you have labs (blood work) drawn today and your tests are completely normal, you will receive your results only by: Marland Kitchen MyChart Message (if you have MyChart) OR . A paper copy in the mail If you have any lab test that is abnormal or we need to change your treatment, we will call you to review the results.   Testing/Procedures: none   Follow-Up: At [facility], you and your health needs are our priority.  As part of our continuing mission to provide you with exceptional heart care, we have created designated Provider Care Teams.  These Care Teams include your primary Cardiologist (physician) and Advanced Practice Providers (APPs -  Physician Assistants and Nurse Practitioners) who all work together to provide you with the care you need, when you need it.  Your next appointment:   2 year(s)  The format for your next appointment:   In Person  Provider:   You may see Lauree Chandler, MD or one of the following Advanced Practice Providers on your designated Care Team:    Melina Copa, PA-C  Ermalinda Barrios, PA-C    Other Instructions

## 2020-03-11 ENCOUNTER — Ambulatory Visit: Payer: BC Managed Care – PPO | Admitting: Primary Care

## 2020-03-12 ENCOUNTER — Ambulatory Visit: Payer: BC Managed Care – PPO | Admitting: Physical Therapy

## 2020-03-12 ENCOUNTER — Other Ambulatory Visit: Payer: Self-pay

## 2020-03-12 ENCOUNTER — Encounter: Payer: Self-pay | Admitting: Physical Therapy

## 2020-03-12 DIAGNOSIS — M62838 Other muscle spasm: Secondary | ICD-10-CM

## 2020-03-12 DIAGNOSIS — R278 Other lack of coordination: Secondary | ICD-10-CM

## 2020-03-12 DIAGNOSIS — M6281 Muscle weakness (generalized): Secondary | ICD-10-CM

## 2020-03-12 NOTE — Patient Instructions (Addendum)
Lubrication . Used for intercourse to reduce friction . Avoid ones that have glycerin, warming gels, tingling gels, icing or cooling gel, scented . Avoid parabens due to a preservative similar to female sex hormone . May need to be reapplied once or several times during sexual activity . Can be applied to both partners genitals prior to vaginal penetration to minimize friction or irritation . Prevent irritation and mucosal tears that cause post coital pain and increased the risk of vaginal and urinary tract infections . Oil-based lubricants cannot be used with condoms due to breaking them down.  Least likely to irritate vaginal tissue.  . Plant based-lubes are safe . Silicone-based lubrication are thicker and last long and used for post-menopausal women  Vaginal Lubricators Here is a list of some suggested lubricators you can use for intercourse. Use the most hypoallergenic product.  You can place on you or your partner.   Slippery Stuff ( water based)  Sylk or Sliquid Natural H2O ( good  if frequent UTI's)- walmart, amazon  Sliquid organics silk-(aloe and silicone based )  [employer] (www.blossom-organics.com)- (aloe based )  Coconut oil, olive oil -not good with condoms   PJur Woman Nude- (water based) amazon  Uberlube- ( silicon) [employer] has an organic one  Yes lubricant- (water based and has plant oil based similar to silicone) [employer] Platinum-Silicone, Target, Walgreens  Olive and Bee intimate cream-  www.oliveandbee.com.au  [place] Things to avoid in lubricants are glycerin, warming gels, tingling gels, icing or cooling  gels, and scented gels.  Also avoid Vaseline. KY jelly, Replens, and Astroglide kills good bacteria(lactobacilli)   STRETCHING THE PELVIC FLOOR MUSCLES NO DILATOR  Supplies . Vaginal lubricant . Mirror (optional) . Gloves (optional) or clean  hands Positioning . Start in a semi-reclined position with your head propped up. Bend your knees and place your thumb or finger at the vaginal opening. Procedure . Apply a moderate amount of lubricant on the outer skin of your vagina, the labia minora.  Apply additional lubricant to your finger. Marland Kitchen Spread the skin away from the vaginal opening. Place the end of your finger at the opening. . Do a maximum contraction of the pelvic floor muscles. Tighten the vagina and the anus maximally and relax. . When you know they are relaxed, gently and slowly insert your finger into your vagina, directing your finger slightly downward, for 2-3 inches of insertion. . Relax and stretch the 6 o'clock position . Hold each stretch for _30-60 seconds, no pain more than 3/10 . Repeat the stretching in the 4 o'clock and 8 o'clock positions. . Next gently move your finger in a "U" shape  several times.  . You can also enter a second finger to work to spread the vaginal opening wider from 3:00-6:00 and 6:00-9:00 or 3:00-9:00 . Perform daily or every other day . Once you have accomplished the techniques you may try them in standing with one foot resting on the tub, or in other positions.  This is a good stretch to do in the shower if you don't need to use lubricant.  Then use the massager around the vulva area then place in the vaginal canal and sweep along side from 1:00 to 6:00 then 11:00 to 6:00.     Sitting    Sit comfortably. Allow body's muscles to relax. Place hands on belly. Inhale slowly and deeply for _3__ seconds, so hands  move out. Then take _3__ seconds to exhale. Repeat __10_ times. Do _3__ times a day. Have hands on rib cage to expand the ribs.  Copyright  VHI. All rights reserved.  About Abdominal Massage  Abdominal massage, also called external colon massage, is a self-treatment circular massage technique that can reduce and eliminate gas and ease constipation. The colon naturally contracts in  waves in a clockwise direction starting from inside the right hip, moving up toward the ribs, across the belly, and down inside the left hip.  When you perform circular abdominal massage, you help stimulate your colon's normal wave pattern of movement called peristalsis.  It is most beneficial when done after eating.  Positioning You can practice abdominal massage with oil while lying down, or in the shower with soap.  Some people find that it is just as effective to do the massage through clothing while sitting or standing.  How to Massage Start by placing your finger tips or knuckles on your right side, just inside your hip bone.  . Make small circular movements while you move upward toward your rib cage.   . Once you reach the bottom right side of your rib cage, take your circular movements across to the left side of the bottom of your rib cage.  . Next, move downward until you reach the inside of your left hip bone.  This is the path your feces travel in your colon. . Continue to perform your abdominal massage in this pattern for 10 minutes each day.     You can apply as much pressure as is comfortable in your massage.  Start gently and build pressure as you continue to practice.  Notice any areas of pain as you massage; areas of slight pain may be relieved as you massage, but if you have areas of significant or intense pain, consult with your healthcare provider.  Other Considerations . General physical activity including bending and stretching can have a beneficial massage-like effect on the colon.  Deep breathing can also stimulate the colon because breathing deeply activates the same nervous system that supplies the colon.   . Abdominal massage should always be used in combination with a bowel-conscious diet that is high in the proper type of fiber for you, fluids (primarily water), and a regular exercise program. [facility] [address], [place], [place]  [address] Phone # [phone] Fax [phone] [email or web address]

## 2020-03-12 NOTE — Therapy (Signed)
[facility] [address], [place], [place], [address] Phone: [phone]   Fax:  [phone]  Physical Therapy Treatment  Patient Details  Name: Jessica Bates MRN: [id number] Date of Birth: 1961/09/12 Referring Provider (PT): Dr. Arvella Nigh   Encounter Date: 03/12/2020   PT End of Session - 03/12/20 0928    Visit Number 2    Date for PT Re-Evaluation 05/14/20    Authorization Type BCBS    Authorization - Visit Number 2    Authorization - Number of Visits 90    PT Start Time 0845    PT Stop Time 0923    PT Time Calculation (min) 38 min    Activity Tolerance Patient tolerated treatment well    Behavior During Therapy [facility] for tasks assessed/performed           Past Medical History:  Diagnosis Date  . Asthma    bronchiectesis  . Crohn disease ([place])   . Depression meopause  . Dyslipidemia   . Fibromyalgia   . GERD (gastroesophageal reflux disease)   . Hemorrhoids   . Hiatal hernia   . Melanoma ([place])   . Pulmonary nodules     Past Surgical History:  Procedure Laterality Date  . ANTERIOR AND POSTERIOR REPAIR WITH SACROSPINOUS FIXATION N/A 08/27/2019   Procedure: ANTERIOR AND POSTERIOR REPAIR WITH SACROSPINOUS FIXATION;  Surgeon: Arvella Nigh, MD;  Location: [place];  Service: Gynecology;  Laterality: N/A;  . colonscopy    . LAPAROSCOPIC VAGINAL HYSTERECTOMY WITH SALPINGO OOPHORECTOMY Bilateral 08/27/2019   Procedure: LAPAROSCOPIC ASSISTED VAGINAL HYSTERECTOMY WITH SALPINGO OOPHORECTOMY;  Surgeon: Arvella Nigh, MD;  Location: [facility];  Service: Gynecology;  Laterality: Bilateral;  NEED BED  . MELANOMA EXCISION      There were no vitals filed for this visit.   Subjective Assessment - 03/12/20 0847    Subjective I have to cancel for next visit.    Patient Stated Goals work on pelvic floor strength    Currently in Pain? Yes    Pain Score 5     Pain Location Vagina      Pain Orientation Mid    Pain Descriptors / Indicators Burning   feels better   Pain Type Acute pain    Pain Onset More than a month ago    Pain Frequency Intermittent    Aggravating Factors  with intercourse, can just come for no reason    Pain Relieving Factors nothing    Multiple Pain Sites No                             [facility] Adult PT Treatment/Exercise - 03/12/20 0001      Self-Care   Self-Care Other Self-Care Comments    Other Self-Care Comments  talked about moisturizers for the vulva and ways to make her own; education on how to massage the vulva and internally  the pelvic floor muscles with her massager to relax and reduce the irritation, education on vaginal lubricants for intercourse to reduce irritation      Neuro Re-ed    Neuro Re-ed Details  sitting to bulge the pelvic floor with expanding the lower rib cage and pelvic floor      Manual Therapy   Manual Therapy Soft tissue mobilization    Manual therapy comments edcuated patient on circular massage to the abdomen and scar massage around the umbilicus    Soft  tissue mobilization circular massage to the abdomen  to promote periestalic motion of the intestines and on the scar below the umbiicus                  PT Education - 03/12/20 0928    Education Details vaginal lubricants; abdominal massage; pelvic floor massage, bulging of the pelvic floor    Person(s) Educated Patient    Methods Explanation;Demonstration;Verbal cues;Handout    Comprehension Returned demonstration;Verbalized understanding            PT Short Term Goals - 03/12/20 0934      PT SHORT TERM GOAL #1   Title independent with initial HEP    Time 4    Period Weeks    Status Achieved      PT SHORT TERM GOAL #2   Title educated on vaginal moisturizers and lubricants for healthy vaginal tissue    Time 4    Period Weeks    Status Achieved      PT SHORT TERM GOAL #3   Title education on abdominal massage to reduce  constipation    Time 4    Period Weeks    Status Achieved             PT Long Term Goals - 02/20/20 1042      PT LONG TERM GOAL #1   Title independent with advanced HEP    Time 12    Period Weeks    Status New    [place] Date 05/14/20      PT LONG TERM GOAL #2   Title burning vaginally after intercourse decreaesd >/= 75% due to elongation of tissue    Time 12    Period Weeks    Status New    Target Date 05/14/20      PT LONG TERM GOAL #3   Title understand correct toileting so she is not straining and putting pressure on the bladder    Time 12    Period Weeks    Status New    Target Date 05/14/20      PT LONG TERM GOAL #4   Title vaginal buring with lifting decreased >/= 75%    Time 12    Period Weeks    Status New    Target Date 05/14/20                 Plan - 03/12/20 0929    Clinical Impression Statement Patient is using her vaginal moisturizer. Patient has learned a home exercise program due to not coming to therapy for 1 month. Patient is making her own vaginal moisturizer and will let therapist know how it is going. Patient understands how to perform abdominal massage and pelvic floor massage. Paient has some difficulty with bulging her pelvic floor due to difficulty expanding her lower rib cage and abdomen. Patient will benefit from skilled therapy to improve strength, improve pelvic floor coordination, and reduce straining on the pelvic floor.    Personal Factors and Comorbidities Comorbidity 3+;Sex    Comorbidities fibromyalgia; melanoma; Chron's disease; s/p laparoscopic Vaginal Hysterectomy with Salpingo Oophorectomy on 08/27/2019    Examination-Activity Limitations Toileting;Lift    Examination-Participation Restrictions Interpersonal Relationship    Stability/Clinical Decision Making Evolving/Moderate complexity    Rehab Potential Excellent    PT Frequency 1x / week    PT Duration 12 weeks    PT Treatment/Interventions ADLs/Self Care Home  Management;Biofeedback;Cryotherapy;Moist Heat;Neuromuscular re-education;Therapeutic exercise;Therapeutic activities;Patient/family education;Manual techniques;Dry needling;Scar mobilization;Taping;Spinal Manipulations  PT Next Visit Plan toileting, bulging of the pelvic floor, assess due to not being here for 1 month    Recommended Other Services MD signed initial note    Consulted and Agree with Plan of Care Patient           Patient will benefit from skilled therapeutic intervention in order to improve the following deficits and impairments:  Decreased coordination, Decreased range of motion, Increased fascial restricitons, Pain, Decreased activity tolerance, Decreased strength, Decreased scar mobility  Visit Diagnosis: Muscle weakness (generalized)  Other lack of coordination  Other muscle spasm     Problem List Patient Active Problem List   Diagnosis Date Noted  . S/P laparoscopic hysterectomy 08/28/2019  . Pelvic relaxation 08/27/2019  . S/P laparoscopic assisted vaginal hysterectomy ([facility]) 08/27/2019  . Flu-like symptoms 10/03/2018  . Sinusitis chronic, frontal 09/22/2018  . Bronchiectasis without complication ([place]) 68/61/6837  . Pulmonary nodule, right 07/11/2018  . Chest tightness 06/13/2017  . Fatigue 06/13/2017  . Chronic cough 05/31/2016    Earlie Counts, PT 03/12/20 9:35 AM   [facility] [address] [place] [employer] [place], [place], [address] Phone: [phone]   Fax:  [phone]  Name: ANNALICIA RENFREW MRN: [id number] Date of Birth: [birth year]

## 2020-03-18 ENCOUNTER — Other Ambulatory Visit: Payer: Self-pay | Admitting: Primary Care

## 2020-03-26 ENCOUNTER — Encounter: Payer: BC Managed Care – PPO | Admitting: Physical Therapy

## 2020-04-07 ENCOUNTER — Ambulatory Visit: Payer: BC Managed Care – PPO | Admitting: Primary Care

## 2020-04-09 ENCOUNTER — Ambulatory Visit: Payer: BC Managed Care – PPO | Admitting: Physical Therapy

## 2020-04-10 DIAGNOSIS — Z03818 Encounter for observation for suspected exposure to other biological agents ruled out: Secondary | ICD-10-CM | POA: Diagnosis not present

## 2020-04-10 DIAGNOSIS — J069 Acute upper respiratory infection, unspecified: Secondary | ICD-10-CM | POA: Diagnosis not present

## 2020-04-16 ENCOUNTER — Ambulatory Visit: Payer: BC Managed Care – PPO | Admitting: Primary Care

## 2020-04-16 ENCOUNTER — Encounter: Payer: Self-pay | Admitting: Physical Therapy

## 2020-04-16 ENCOUNTER — Other Ambulatory Visit: Payer: Self-pay

## 2020-04-16 ENCOUNTER — Encounter: Payer: Self-pay | Admitting: Primary Care

## 2020-04-16 ENCOUNTER — Ambulatory Visit: Payer: BC Managed Care – PPO | Attending: Obstetrics and Gynecology | Admitting: Physical Therapy

## 2020-04-16 DIAGNOSIS — J479 Bronchiectasis, uncomplicated: Secondary | ICD-10-CM

## 2020-04-16 DIAGNOSIS — M6281 Muscle weakness (generalized): Secondary | ICD-10-CM | POA: Diagnosis not present

## 2020-04-16 DIAGNOSIS — M62838 Other muscle spasm: Secondary | ICD-10-CM | POA: Insufficient documentation

## 2020-04-16 DIAGNOSIS — R278 Other lack of coordination: Secondary | ICD-10-CM | POA: Diagnosis not present

## 2020-04-16 MED ORDER — MONTELUKAST SODIUM 10 MG PO TABS: 10.0000 mg | ORAL_TABLET | Freq: Every day | ORAL | 11 refills | Status: DC

## 2020-04-16 NOTE — Patient Instructions (Signed)
Pleasure seeing you today Jessica Bates  Recommendations: Continue Symbicort two puffs twice daily (rinse mouth after use) Continue Singulair 8m at bedtime   RX: Sending in refill for Singulair for 1 year You have 5 refills of Symbicort, call when refill needed  Follow-up: 1 year with Dr. RChase Caller    Bronchiectasis  Bronchiectasis is a condition in which the airways in the lungs (bronchi) are damaged and widened. The condition makes it hard for the lungs to get rid of mucus, and it causes mucus to gather in the bronchi. This condition often leads to lung infections, which can make the condition worse. What are the causes? You can be born with this condition or you can develop it later in life. Common causes of this condition include:  Cystic fibrosis.  Repeated lung infections, such as pneumonia or tuberculosis.  An object or other blockage in the lungs.  Breathing in fluid, food, or other objects (aspiration).  A problem with the immune system and lung structure that is present at birth (congenital). Sometimes the cause is not known. What are the signs or symptoms? Common symptoms of this condition include:  A daily cough that brings up mucus and lasts for more than 3 weeks.  Lung infections that happen often.  Shortness of breath and wheezing.  Weakness and fatigue. How is this diagnosed? This condition is diagnosed with tests, such as:  Chest X-rays or CT scans. These are done to check for changes in the lungs.  Breathing tests. These are done to check how well your lungs are working.  A test of a sample of your saliva (sputum culture). This test is done to check for infection.  Blood tests and other tests. These are done to check for related diseases or causes. How is this treated? Treatment for this condition depends on the severity of the illness and its cause. Treatment may include:  Medicines that loosen mucus so it can be coughed up  (expectorants).  Medicines that relax the muscles of the bronchi (bronchodilators).  Antibiotic medicines to prevent or treat infection.  Physical therapy to help clear mucus from the lungs. Techniques may include: ? Postural drainage. This is when you sit or lie in certain positions so that mucus can drain by gravity. ? Chest percussion. This involves tapping the chest or back with a cupped hand. ? Chest vibration. For this therapy, a hand or special equipment vibrates your chest and back.  Surgery to remove the affected part of the lung. This may be done in severe cases. Follow these instructions at home: Medicines  Take over-the-counter and prescription medicines only as told by your health care provider.  If you were prescribed an antibiotic medicine, take it as told by your health care provider. Do not stop taking the antibiotic even if you start to feel better.  Avoid taking sedatives and antihistamines unless your health care provider tells you to take them. These medicines tend to thicken the mucus in the lungs. Managing symptoms  Perform breathing exercises or techniques to clear your lungs as told by your health care provider.  Consider using a cold steam vaporizer or humidifier in your room or home to help loosen secretions.  If you have a cough that gets worse at night, try sleeping in a semi-upright position. General instructions  Get plenty of rest.  Drink enough fluid to keep your urine clear or pale yellow.  Stay inside when pollution and ozone levels are high.  Stay up to  date with vaccinations and immunizations.  Avoid cigarette smoke and other lung irritants.  Do not use any products that contain nicotine or tobacco, such as cigarettes and e-cigarettes. If you need help quitting, ask your health care provider.  Keep all follow-up visits as told by your health care provider. This is important. Contact a health care provider if:  You cough up more sputum  than before and the sputum is yellow or green in color.  You have a fever.  You cannot control your cough and are losing sleep. Get help right away if:  You cough up blood.  You have chest pain.  You have increasing shortness of breath.  You have pain that gets worse or is not controlled with medicines.  You have a fever and your symptoms suddenly get worse. Summary  Bronchiectasis is a condition in which the airways in the lungs (bronchi) are damaged and widened. The condition makes it hard for the lungs to get rid of mucus, and it causes mucus to gather in the bronchi.  Treatment usually includes therapy to help clear mucus from the lungs.  Stay up to date with vaccinations and immunizations. This information is not intended to replace advice given to you by your health care provider. Make sure you discuss any questions you have with your health care provider. Document Revised: 08/12/2017 Document Reviewed: 10/04/2016 Elsevier Patient Education  2020 [employer].

## 2020-04-16 NOTE — Patient Instructions (Addendum)
Dr. Flint Melter. [address], [place], [place] [address] Phone: [phone]  Toileting Techniques for Bowel Movements    An Evacuation/Defecation Plan   Here are the 4 basic points:  1. Lean forward enough for your elbows to rest on your knees 2. Support your feet on the floor or use a low stool if your feet don't touch the floor  3. Push out your belly as if you have swallowed a beach ball--you should feel a widening of your waist. "Belly Big, Belly Hard" 4. Open and relax your pelvic floor muscles, rather than tightening around the anus  While you are sitting on the toilet pay attention to the following areas: . Jaw and mouth position- relaxed not clenched . Angle of your hips - leaning slightly forward . Whether your feet touch the ground or not - should be flat and supported . Arm placement - rest against your thighs . Spine position - flat back . Waist . Breathing - exhale as you push (like blowing up a balloon or try using other sounds such as ahhhh, shhhhh, ohhhh or grrrrrrr) . Belly - hard and tight as you push . Anus (opening of the anal canal) - relaxed and open as you push . Anus - Tighten and lift pulling the muscle back in after you are done or if taking a break  If you are not successful after 10-15 minutes, try again later.  Avoid negative self-talk about your toileting experience.   Read this for more details and ask your PT if you need suggestions for adjustments or limitations:  1) Sitting on the toilet  a) Make sure your feet are supported - flat on the floor or step stool b) Many people find it effective to lean forward or raise their knees.  Propping your feet on a step stool (Squatty Potty is a brand name) can help the muscles around the anus to relax  c) When you lean forward, place your forearms on your thighs for support  2) Relaxing a) Breathe deeply and slowly in through your nose and  out through your mouth. b) To become aware of how to relax your muscles, contracting and releasing muscles can be helpful.  Pull your pelvic floor muscles in tightly by using the image of holding back gas, or closing around the anus (visualize making a circle smaller) and lifting the anus up and in.  Then release the muscles and your anus should drop down and feel open. Repeat 5 times ending with the feeling of relaxation. c) Keep your pelvic floor muscles relaxed; let your belly bulge out. d) The digestive tract starts at the mouth and ends at the anal opening, so be sure to relax both ends of the tube.  Place your tongue on the roof of your mouth with your teeth separated.  This helps relax your mouth and will help to relax the anus at the same time.  3) Emptying (defecation) a) Keep your pelvic floor and sphincter relaxed, then bulge your anal muscles.  Make the anal opening wide.  b) Stick your belly out as if you have swallowed a beach ball. c) Make your belly wall hard using your belly muscles while continuing to breathe. Doing this makes it easier to open your anus. d) Breath out and give a grunt (or try using other sounds such as ahhhh, shhhhh, ohhhh or grrrrrrr). e)  Can also try to act  as if you are blowing up a balloon as you push  4) Finishing a) As you finish your bowel movement, pull the pelvic floor muscles up and in.  This will leave your anus in the proper place rather than remaining pushed out and down. If you leave your anus pushed out and down, it will start to feel as though that is normal and give you incorrect signals about needing to have a bowel movement. Slow Contraction: Gravity Eliminated (Hook-Lying)    Lie with hips and knees bent. Slowly squeeze pelvic floor for _5__ seconds. Rest for 10___ seconds. Repeat _5__ times. Do _4__ times a day.   Copyright  VHI. All rights reserved.   Lay on back with knees bent, pillow under hips Relax for 5 minutes  3 times a  week for 2-3 weeks massage the back of the vaginal canal to the right  Then go to the sides by the urethra. Do while in the bath.   [facility] [address] [place], [place] [address] Phone # [phone] Fax [phone]

## 2020-04-16 NOTE — Progress Notes (Signed)
@Patient  ID: Jessica Bates, female    DOB: 1960-11-15, 59 y.o.   MRN: [id number]  Chief Complaint  Patient presents with  . Follow-up    Referring provider: Hulan Fess, MD  HPI: 59 year old female, former smoker quit in 2000 (9 pack year hx). PMH significant for bronchiectasis, chronic cough, sinusitis, Crohn's and Raynauds. Patient of Dr. Chase Caller. Maintained on Symbicort 80 and Montelukast.  Previous [facility] pulmonary encounter: 10/03/2018 Patient presents today with acute complaints of cough, sinus headache and bilateral rib pain. Complains of fever, last reported 1 week ago. She was given 76m prednisone taper by PCP. She does not feel her sinuses are currently infected. Traveling next few weeks for work. She is concerned about influenza developing into possible pneumonia. She does have a hx of bronchiectasis. Uses her flutter valve as prescribed and Symbicort twice daily. Afebrile today in office.   04/16/2020- Interim hx Patient presents today for 18 month follow-up visit for bronchiectasis. She is doing well, maintained on Symbicort 80 two puffs twice daily. CT chest in December 2020 showed stable 456mright upper lobe pulmonary nodule, consistent with benign etiology. No new or enlarging nodules or masses.   She is feeling really well. She has a slight upper awaiways cough likely d/t post nasal drip. States it more throat clearing. She did get tested for Covid which was negative and she is vaccinated. She uses flutter valve as needed. Denies shortness of breath, wheezing.   Significant Testing: PFTs>> 06/14/2017>> mild obstruction>> slightly decreased DLCO  FENO>> 06/13/2017>>18 ppb  CXR>>06/13/2017>>No active cardiopulmonary disease.  CT Chest 05/17/2017>Continued improvement in post inflammatory scarring in the RIGHT middle lobe and lingula. Stable small pulmonary nodule.  CT chest October 2019 showed stable 4 mm right middle lobe nodule. Similar appearance of mild right  middle lobe and lingular bronchiectasis and small airway impaction.  11/07/2016- CBC with differential-eosinophils relative 00 feels absolute 0   Allergies  Allergen Reactions  . Cymbalta [Duloxetine Hcl]     flushing  . Remicade [Infliximab]     Flushing  . Penicillins Rash    Did it involve swelling of the face/tongue/throat, SOB, or low BP? No Did it involve sudden or severe rash/hives, skin peeling, or any reaction on the inside of your mouth or nose? No Did you need to seek medical attention at a hospital or doctor's office? No When did it last happen?30 + years If all above answers are "NO", may proceed with cephalosporin use.     Immunization History  Administered Date(s) Administered  . Influenza Split 06/13/2018  . Influenza,inj,Quad PF,6+ Mos 06/14/2015, 06/13/2016, 06/15/2019  . Influenza-Unspecified 06/12/2017  . Pneumococcal Conjugate-13 12/31/2016  . Pneumococcal Polysaccharide-23 12/31/2016    Past Medical History:  Diagnosis Date  . Asthma    bronchiectesis  . Crohn disease ([facility]  . Depression meopause  . Dyslipidemia   . Fibromyalgia   . GERD (gastroesophageal reflux disease)   . Hemorrhoids   . Hiatal hernia   . Melanoma ([facility]  . Pulmonary nodules     Tobacco History: Social History   Tobacco Use  Smoking Status Former Smoker  . Packs/day: 1.00  . Years: 9.00  . Pack years: 9.00  . Types: Cigarettes  . Quit date: 09/13/1998  . Years since quitting: 21.6  Smokeless Tobacco Never Used   Counseling given: Not Answered   Outpatient Medications Prior to Visit  Medication Sig Dispense Refill  . acyclovir (ZOVIRAX) 400 MG tablet Take 400 mg  by mouth daily.    Marland Kitchen ALPRAZolam (XANAX) 0.25 MG tablet Take 0.125 mg by mouth daily as needed for anxiety.     Marland Kitchen aspirin-acetaminophen-caffeine (EXCEDRIN MIGRAINE) 250-250-65 MG tablet Take 2 tablets by mouth every 6 (six) hours as needed for headache.    Marland Kitchen atorvastatin (LIPITOR) 40 MG tablet Take  40 mg by mouth at bedtime.     . budesonide-formoterol (SYMBICORT) 80-4.5 MCG/ACT inhaler INHALE 2 PUFFS BY MOUTH TWICE DAILY 10.2 g 5  . cetirizine (ZYRTEC) 10 MG tablet Take 10 mg by mouth daily.    . cholecalciferol (VITAMIN D3) 25 MCG (1000 UT) tablet Take 1,000 Units by mouth daily.    Marland Kitchen conjugated estrogens (PREMARIN) vaginal cream Place 1 Applicatorful vaginally daily.    Marland Kitchen estradiol (VIVELLE-DOT) 0.05 MG/24HR patch Place 1 patch onto the skin 2 (two) times a week.    Marland Kitchen LINZESS 145 MCG CAPS capsule Take 145 mcg by mouth daily. (Patient not taking: Reported on 04/16/2020)    . mesalamine (APRISO) 0.375 g 24 hr capsule Take 750 mg by mouth daily.     . methocarbamol (ROBAXIN) 500 MG tablet Take 500 mg by mouth daily as needed (headaches).   1  . naproxen sodium (ALEVE) 220 MG tablet Take 220 mg by mouth 2 (two) times daily as needed (pain).    Marland Kitchen omeprazole (PRILOSEC) 20 MG capsule Take 20 mg by mouth daily as needed (acid reflux).    Marland Kitchen Respiratory Therapy Supplies (FLUTTER) DEVI Use flutter valve 3 times a day 1 each 0  . Zinc 30 MG CAPS Take 30 mg by mouth daily.    Marland Kitchen zolpidem (AMBIEN) 10 MG tablet Take 10 mg by mouth at bedtime.    . montelukast (SINGULAIR) 10 MG tablet Take 10 mg by mouth at bedtime.   5   No facility-administered medications prior to visit.   Review of Systems  Review of Systems  Constitutional: Negative.   HENT: Positive for postnasal drip.   Respiratory: Positive for cough. Negative for chest tightness, shortness of breath and wheezing.   Cardiovascular: Negative.    Physical Exam  BP 118/82 (BP Location: Left Arm, Cuff Size: Normal)   Pulse 69   Temp (!) 97.3 F (36.3 C) (Oral)   Ht 5' 6"  (1.676 m)   Wt 123 lb 9.6 oz (56.1 kg)   LMP 02/14/2014 Comment: bcp//A.C.  SpO2 100%   BMI 19.95 kg/m  Physical Exam Constitutional:      Appearance: Normal appearance.  HENT:     Head: Normocephalic and atraumatic.     Mouth/Throat:     Mouth: Mucous membranes  are moist.     Pharynx: Oropharynx is clear.  Cardiovascular:     Rate and Rhythm: Normal rate and regular rhythm.  Pulmonary:     Effort: Pulmonary effort is normal.     Breath sounds: Normal breath sounds.  Musculoskeletal:        General: Normal range of motion.     Cervical back: Normal range of motion and neck supple.  Skin:    General: Skin is warm and dry.  Neurological:     General: No focal deficit present.     Mental Status: She is alert and oriented to person, place, and time. Mental status is at baseline.  Psychiatric:        Mood and Affect: Mood normal.        Behavior: Behavior normal.        Thought Content: Thought  content normal.        Judgment: Judgment normal.      Lab Results:  CBC    Component Value Date/Time   WBC 11.6 (H) 08/28/2019 0524   RBC 3.35 (L) 08/28/2019 0524   HGB 10.7 (L) 08/28/2019 0524   HCT 34.5 (L) 08/28/2019 0524   PLT 257 08/28/2019 0524   MCV 103.0 (H) 08/28/2019 0524   MCH 31.9 08/28/2019 0524   MCHC 31.0 08/28/2019 0524   RDW 11.7 08/28/2019 0524   LYMPHSABS 2.5 11/07/2016 1157   MONOABS 0.9 11/07/2016 1157   EOSABS 0.0 11/07/2016 1157   BASOSABS 0.0 11/07/2016 1157    BMET    Component Value Date/Time   NA 139 08/23/2019 1345   K 4.0 08/23/2019 1345   CL 105 08/23/2019 1345   CO2 27 08/23/2019 1345   GLUCOSE 114 (H) 08/23/2019 1345   BUN 12 08/23/2019 1345   CREATININE 0.55 08/23/2019 1345   CALCIUM 9.0 08/23/2019 1345   GFRNONAA >60 08/23/2019 1345   GFRAA >60 08/23/2019 1345    BNP No results found for: BNP  ProBNP No results found for: PROBNP  Imaging: No results found.   Assessment & Plan:   Bronchiectasis without complication (HCC) - Stable interval, no significant complaints. Recently sick with cold symptoms but covid negative. Cough is mostly upper airway and dry. Afebrile.  - Continue Symbicort 80 two puffs twice daily and Singulair 39m at bedtime (Refills sent to pharmacy) - Use flutter  valve as needed for congestion  - CT chest 07/20/19 showed stable 4 mm right upper lobe pulmonary nodule, consistent with benign etiology. No active disease within the thorax  - FU in 1 year or sooner if needed    EMartyn Ehrich NP 04/17/2020

## 2020-04-16 NOTE — Therapy (Signed)
[facility] [address] [place], [place], [address] Phone: [phone]   Fax:  [phone]  Physical Therapy Treatment  Patient Details  Name: Jessica Bates MRN: [id number] Date of Birth: [birth year] Referring Provider (PT): Dr. Arvella Nigh   Encounter Date: 04/16/2020   PT End of Session - 04/16/20 0849    Visit Number 3    Date for PT Re-Evaluation 05/14/20    Authorization Type BCBS    Authorization - Visit Number 3    Authorization - Number of Visits 9    PT Start Time 0800    PT Stop Time 0845    PT Time Calculation (min) 45 min    Activity Tolerance Patient tolerated treatment well    Behavior During Therapy [facility] for tasks assessed/performed           Past Medical History:  Diagnosis Date  . Asthma    bronchiectesis  . Crohn disease ([place])   . Depression meopause  . Dyslipidemia   . Fibromyalgia   . GERD (gastroesophageal reflux disease)   . Hemorrhoids   . Hiatal hernia   . Melanoma ([place])   . Pulmonary nodules     Past Surgical History:  Procedure Laterality Date  . ANTERIOR AND POSTERIOR REPAIR WITH SACROSPINOUS FIXATION N/A 08/27/2019   Procedure: ANTERIOR AND POSTERIOR REPAIR WITH SACROSPINOUS FIXATION;  Surgeon: Arvella Nigh, MD;  Location: [place];  Service: Gynecology;  Laterality: N/A;  . colonscopy    . LAPAROSCOPIC VAGINAL HYSTERECTOMY WITH SALPINGO OOPHORECTOMY Bilateral 08/27/2019   Procedure: LAPAROSCOPIC ASSISTED VAGINAL HYSTERECTOMY WITH SALPINGO OOPHORECTOMY;  Surgeon: Arvella Nigh, MD;  Location: [place];  Service: Gynecology;  Laterality: Bilateral;  NEED BED  . MELANOMA EXCISION      There were no vitals filed for this visit.   Subjective Assessment - 04/16/20 0803    Subjective I am not worse. It is intermittent. Some days I feel buring and some days my bladder is more descended than other.    Patient Stated Goals work on pelvic floor  strength                          Pelvic Floor Special Questions - 04/16/20 0001    Pelvic Floor Internal Exam Patient confirms identification and approves PT to assess pelvic floor and treatment    Exam Type Vaginal    Palpation fascial releases around the urethra sphincter, and bulbocavernosus; restriction on the right side of  perineal body; tenderness located on the sides of the uretra and after manual work none.     Strength fair squeeze, definite lift   very small lift            [facility] Adult PT Treatment/Exercise - 04/16/20 0001      Therapeutic Activites    Therapeutic Activities Other Therapeutic Activities    Other Therapeutic Activities education on correct toileting technique to relax the pelvic floor, how to bear down without straining the pelvic floor      Neuro Re-ed    Neuro Re-ed Details  spuine with therapist gloved index finger in the vaginal canal to facilitate a pelvic floor contraction with a lift      Manual Therapy   Manual Therapy Internal Pelvic Floor    Internal Pelvic Floor scar massage on the right side of the posterior introitus, release of the bulbocavernosus bilaterally and around the urethra  PT Education - 04/16/20 0848    Education Details pelvic floor contraction; how to massage the pelvic floor, toileting,    Person(s) Educated Patient    Methods Explanation;Demonstration;Verbal cues;Handout    Comprehension Returned demonstration;Verbalized understanding            PT Short Term Goals - 03/12/20 0934      PT SHORT TERM GOAL #1   Title independent with initial HEP    Time 4    Period Weeks    Status Achieved      PT SHORT TERM GOAL #2   Title educated on vaginal moisturizers and lubricants for healthy vaginal tissue    Time 4    Period Weeks    Status Achieved      PT SHORT TERM GOAL #3   Title education on abdominal massage to reduce constipation    Time 4    Period Weeks    Status  Achieved             PT Long Term Goals - 04/16/20 0932      PT LONG TERM GOAL #1   Title independent with advanced HEP    Time 12    Period Weeks    Status On-going      PT LONG TERM GOAL #2   Title burning vaginally after intercourse decreaesd >/= 75% due to elongation of tissue    Time 12    Period Weeks    Status On-going      PT LONG TERM GOAL #3   Title understand correct toileting so she is not straining and putting pressure on the bladder    Time 12    Period Weeks    Status Achieved      PT LONG TERM GOAL #4   Title vaginal buring with lifting decreased >/= 75%    Time 12    Period Weeks    Status On-going                 Plan - 04/16/20 0926    Clinical Impression Statement Patient reports she still feel the prolapse. It is worse at the end of the day. She was educated on management by laying on her back in the middle of the day with hips on a pillow for 5 minutes and how to toilet correctly to not strain. Pior to manual work there was restrictions in the bulbocavernosus and right side of the posterior introitus. After the manual work the restrictions were less and no tenderness around the urethra. Patient started with pelvic floor contraction at 2/5 and ended with a 3/5 wiht a very small lift. When she cleared her throat the therapist felt the the prolapse. Discussed with patient on pessary to manage her prolapse but she does not want to try one. Patient still has some burning after interscourse. Patient will benefit from skilled therapy to improve strength, improve pelvic floor coordination, and reduce straining on the pelvic floor.    Personal Factors and Comorbidities Comorbidity 3+;Sex    Comorbidities fibromyalgia; melanoma; Chron's disease; s/p laparoscopic Vaginal Hysterectomy with Salpingo Oophorectomy on 08/27/2019    Examination-Activity Limitations Toileting;Lift    Examination-Participation Restrictions Interpersonal Relationship     Stability/Clinical Decision Making Evolving/Moderate complexity    Rehab Potential Excellent    PT Frequency 1x / week    PT Duration 12 weeks    PT Treatment/Interventions ADLs/Self Care Home Management;Biofeedback;Cryotherapy;Moist Heat;Neuromuscular re-education;Therapeutic exercise;Therapeutic activities;Patient/family education;Manual techniques;Dry needling;Scar mobilization;Taping;Spinal Manipulations    PT  Next Visit Plan see how the prolapse is doing, work on reduce pressure on the pelvic floor with exercise; go over her exercises and see how she is doing with her pressure management; advance pelvic floor strength in sittng and 1/2 kneel with lift; see about vaginal burning with lifting    Consulted and Agree with Plan of Care Patient           Patient will benefit from skilled therapeutic intervention in order to improve the following deficits and impairments:  Decreased coordination, Decreased range of motion, Increased fascial restricitons, Pain, Decreased activity tolerance, Decreased strength, Decreased scar mobility  Visit Diagnosis: Muscle weakness (generalized)  Other lack of coordination  Other muscle spasm     Problem List Patient Active Problem List   Diagnosis Date Noted  . S/P laparoscopic hysterectomy 08/28/2019  . Pelvic relaxation 08/27/2019  . S/P laparoscopic assisted vaginal hysterectomy (LAVH) 08/27/2019  . Flu-like symptoms 10/03/2018  . Sinusitis chronic, frontal 09/22/2018  . Bronchiectasis without complication ([place]) 58/52/7782  . Pulmonary nodule, right 07/11/2018  . Chest tightness 06/13/2017  . Fatigue 06/13/2017  . Chronic cough 05/31/2016    Earlie Counts, PT 04/16/20 9:34 AM   [facility] [address] [place], [place], [address] Phone: [phone]   Fax:  [phone]  Name: Jessica Bates MRN: [id number] Date of Birth: 1960/12/20

## 2020-04-17 ENCOUNTER — Encounter: Payer: Self-pay | Admitting: Primary Care

## 2020-04-17 NOTE — Assessment & Plan Note (Signed)
-   Stable interval, no significant complaints. Recently sick with cold symptoms but covid negative. Cough is mostly upper airway and dry. Afebrile.  - Continue Symbicort 80 two puffs twice daily and Singulair 41m at bedtime (Refills sent to pharmacy) - Use flutter valve as needed for congestion  - CT chest 07/20/19 showed stable 4 mm right upper lobe pulmonary nodule, consistent with benign etiology. No active disease within the thorax  - FU in 1 year or sooner if needed

## 2020-04-18 ENCOUNTER — Telehealth: Payer: Self-pay | Admitting: Primary Care

## 2020-04-18 ENCOUNTER — Telehealth: Payer: Self-pay | Admitting: Adult Health

## 2020-04-18 MED ORDER — AZITHROMYCIN 250 MG PO TABS: ORAL_TABLET | ORAL | 0 refills | Status: AC

## 2020-04-18 NOTE — Telephone Encounter (Addendum)
Spoke with pt. States that she saw Beth on 04/16/2020 and told her about having a "cold." Beth advised her to call us if her cough became productive. Cough is now producing clear, thick mucus. She also complains of having chest tightness and heaviness. Denies wheezing, shortness of breath, fever, sick contacts.  TP - please advise. Thanks.

## 2020-04-18 NOTE — Telephone Encounter (Signed)
Lm on mobile number on file.

## 2020-04-18 NOTE — Telephone Encounter (Signed)
ATC pt, no answer and her VM is full. Will try back.

## 2020-04-18 NOTE — Telephone Encounter (Signed)
Pt called back, but she states the [facility] office has already reached out to her and nothing further is needed.

## 2020-04-18 NOTE — Telephone Encounter (Signed)
Noted.  Will close encounter.  

## 2020-04-18 NOTE — Telephone Encounter (Signed)
Patient may have a Z-Pak #1 take as directed to have on hold if symptoms continue with worsening discolored mucus. If not improving will need office visit for further evaluation and treatment options Please use Mucinex flutter valve.  Verified allergy list and does not have this did please verify that she is able to take a Z-Pak  Please contact office for sooner follow up if symptoms do not improve or worsen or seek emergency care

## 2020-04-18 NOTE — Telephone Encounter (Signed)
From previous message, increased cough, congestion with thick mucus .  Requests for abx.  Zpack #1 take as directed Please contact office for sooner follow up if symptoms do not improve or worsen or seek emergency care

## 2020-04-18 NOTE — Telephone Encounter (Signed)
Patient is returning phone call. Patient phone number is [phone]. May leave detailed message on voicemail.

## 2020-04-18 NOTE — Telephone Encounter (Signed)
ATC-unable to leave vm due to mailbox being full.

## 2020-04-30 ENCOUNTER — Encounter: Payer: BC Managed Care – PPO | Admitting: Physical Therapy

## 2020-05-07 DIAGNOSIS — D225 Melanocytic nevi of trunk: Secondary | ICD-10-CM | POA: Diagnosis not present

## 2020-05-07 DIAGNOSIS — L821 Other seborrheic keratosis: Secondary | ICD-10-CM | POA: Diagnosis not present

## 2020-05-07 DIAGNOSIS — Z85820 Personal history of malignant melanoma of skin: Secondary | ICD-10-CM | POA: Diagnosis not present

## 2020-05-07 DIAGNOSIS — L814 Other melanin hyperpigmentation: Secondary | ICD-10-CM | POA: Diagnosis not present

## 2020-05-14 ENCOUNTER — Encounter: Payer: Self-pay | Admitting: Physical Therapy

## 2020-05-14 ENCOUNTER — Ambulatory Visit: Payer: BC Managed Care – PPO | Attending: Obstetrics and Gynecology | Admitting: Physical Therapy

## 2020-05-14 ENCOUNTER — Other Ambulatory Visit: Payer: Self-pay

## 2020-05-14 DIAGNOSIS — R278 Other lack of coordination: Secondary | ICD-10-CM | POA: Insufficient documentation

## 2020-05-14 DIAGNOSIS — M62838 Other muscle spasm: Secondary | ICD-10-CM | POA: Diagnosis not present

## 2020-05-14 DIAGNOSIS — M6281 Muscle weakness (generalized): Secondary | ICD-10-CM | POA: Diagnosis not present

## 2020-05-14 NOTE — Patient Instructions (Addendum)
Lay on back with 2 pillows under the buttocks  Place feet on the couch  Stay in the position for 5 minutes  Poise impress like a tampon  Strap you wear outside your underwear to pull prolapse upward  When you are doing something strenuous then breath out to reduce the pressure on the prolapse.   [facility] [address], [place], [place] [address] Phone # [phone] Fax [phone]  Access Code: [email or web address] URL: [email or web address] Date: 05/14/2020 Prepared by: [place] with Heels on [employer] - 1 x daily - 7 x weekly - 3 sets - 10 reps Supine Hamstring Curl on Swiss Ball - 1 x daily - 7 x weekly - 3 sets - 10 reps Supine Bridge with Mini Swiss Ball Between Knees - 1 x daily - 7 x weekly - 3 sets - 10 reps Bird Dog on [employer] - 1 x daily - 7 x weekly - 3 sets - 10 reps Prone Back Extension on [employer] - Arms Across Chest - 1 x daily - 7 x weekly - 3 sets - 10 reps Prone Bilateral Hip Extension on Swiss Ball - 1 x daily - 7 x weekly - 3 sets - 10 reps Prone Hip Extension with Bent Knee on Swiss Ball - 1 x daily - 7 x weekly - 3 sets - 10 reps Prone Pelvic Floor Contraction on Swiss Ball - 1 x daily - 7 x weekly - 3 sets - 10 reps

## 2020-05-14 NOTE — Therapy (Signed)
[facility] [address] [place], [place], [address] Phone: [phone]   Fax:  [phone]  Physical Therapy Treatment  Patient Details  Name: Jessica Bates MRN: [id number] Date of Birth: Jan 26, 1961 Referring Provider (PT): Dr. Arvella Nigh   Encounter Date: 05/14/2020   PT End of Session - 05/14/20 0847    Visit Number 4    Date for PT Re-Evaluation 05/14/20    Authorization Type BCBS    Authorization - Visit Number 4    Authorization - Number of Visits 90    PT Start Time 0810    PT Stop Time 0845    PT Time Calculation (min) 35 min    Activity Tolerance Patient tolerated treatment well    Behavior During Therapy [facility] for tasks assessed/performed           Past Medical History:  Diagnosis Date  . Asthma    bronchiectesis  . Crohn disease ([place])   . Depression meopause  . Dyslipidemia   . Fibromyalgia   . GERD (gastroesophageal reflux disease)   . Hemorrhoids   . Hiatal hernia   . Melanoma ([place])   . Pulmonary nodules     Past Surgical History:  Procedure Laterality Date  . ANTERIOR AND POSTERIOR REPAIR WITH SACROSPINOUS FIXATION N/A 08/27/2019   Procedure: ANTERIOR AND POSTERIOR REPAIR WITH SACROSPINOUS FIXATION;  Surgeon: Arvella Nigh, MD;  Location: [place];  Service: Gynecology;  Laterality: N/A;  . colonscopy    . LAPAROSCOPIC VAGINAL HYSTERECTOMY WITH SALPINGO OOPHORECTOMY Bilateral 08/27/2019   Procedure: LAPAROSCOPIC ASSISTED VAGINAL HYSTERECTOMY WITH SALPINGO OOPHORECTOMY;  Surgeon: Arvella Nigh, MD;  Location: [place];  Service: Gynecology;  Laterality: Bilateral;  NEED BED  . MELANOMA EXCISION      There were no vitals filed for this visit.   Subjective Assessment - 05/14/20 0811    Subjective I feel the same. Intermittent thing. Sometimes the burning will come back. When the burning comes back I feel crampy. I have been doing my HEP. I am trying to  not get constipated. . I am going to see Dr. Rodena Piety.    Patient Stated Goals work on pelvic floor strength    Currently in Pain? No/denies              [facility] PT Assessment - 05/14/20 0001      Assessment   Medical Diagnosis N94.10 Unspedified dyspareunia    Referring Provider (PT) Dr. Arvella Nigh    Onset Date/Surgical Date 05/23/19    Prior Therapy none      Precautions   Precautions None      Restrictions   Weight Bearing Restrictions No      Home Environment   Living Environment Private residence      Prior Function   Level of Independence Independent    Vocation Full time employment    Vocation Requirements sitting    Leisure walk, barre workout      Cognition   Overall Cognitive Status Within Functional Limits for tasks assessed      Posture/Postural Control   Posture/Postural Control No significant limitations      AROM   Lumbar Flexion full-increased muscle on left    Lumbar Extension full    Lumbar - Right Side Bend decreased by 25% no extension    Lumbar - Left Side Bend decreased by 25% with flexion    Lumbar - Right Rotation decreased by 25%    Lumbar -  Left Rotation decreased 25%      PROM   Right Hip Flexion 115    Right Hip Internal Rotation  15    Left Hip Internal Rotation  20      Palpation   Spinal mobility decreased movement of T5-L5    SI assessment  left ilium rotated posteriorly    Palpation comment tightness in the left lateral abdomen            Bridge with Heels on Swiss Ball - 1 x daily - 7 x weekly - 3 sets - 10 reps Supine Hamstring Curl on Swiss Ball - 1 x daily - 7 x weekly - 3 sets - 10 reps Supine Bridge with Mini Swiss Ball Between Knees - 1 x daily - 7 x weekly - 3 sets - 10 reps Bird Dog on [employer] - 1 x daily - 7 x weekly - 3 sets - 10 reps Prone Back Extension on [employer] - Arms Across Chest - 1 x daily - 7 x weekly - 3 sets - 10 reps Prone Bilateral Hip Extension on Swiss Ball - 1 x daily - 7 x weekly - 3  sets - 10 reps Prone Hip Extension with Bent Knee on Swiss Ball - 1 x daily - 7 x weekly - 3 sets - 10 reps Prone Pelvic Floor Contraction on Swiss Ball - 1 x daily - 7 x weekly - 3 sets - 10 reps            Pelvic Floor Special Questions - 05/14/20 0001    Prior Pregnancies Yes    Number of Pregnancies 4   1 miscarriage   Number of Vaginal Deliveries 3    Any difficulty with labor and deliveries No    Currently Sexually Active Yes    Is this Painful Yes   happens afterwards   Urinary Leakage No    Urinary urgency Yes   slightly   Fecal incontinence No   constipation and straining   Falling out feeling (prolapse) Yes    Activities that cause feeling of prolapse lifting    Prolapse Anterior Wall    Strength fair squeeze, definite lift   very small lift            [facility] Adult PT Treatment/Exercise - 05/14/20 0001      Self-Care   Self-Care Other Self-Care Comments    Other Self-Care Comments  reviewed ways to manage her prolapse with laying on her back and hips elevated; using a pessary, poise impressa and a strap outside her underwear to push the prolapse upward      Therapeutic Activites    Therapeutic Activities ADL's    ADL's during any strenous exercise to breath out and not hold her breath      Exercises   Exercises Other Exercises    Other Exercises  instructed patient on exercises she can do with the physioball that will not put a strain on the prolapse                  PT Education - 05/14/20 0846    Education Details Access Code: [email or web address]    Person(s) Educated Patient    Methods Explanation;Demonstration;Handout    Comprehension Verbalized understanding;Returned demonstration            PT Short Term Goals - 03/12/20 0934      PT SHORT TERM GOAL #1   Title independent with initial HEP    Time  4    Period Weeks    Status Achieved      PT SHORT TERM GOAL #2   Title educated on vaginal moisturizers and lubricants for healthy vaginal  tissue    Time 4    Period Weeks    Status Achieved      PT SHORT TERM GOAL #3   Title education on abdominal massage to reduce constipation    Time 4    Period Weeks    Status Achieved             PT Long Term Goals - 05/14/20 1152      PT LONG TERM GOAL #1   Title independent with advanced HEP    Time 12    Period Weeks    Status Achieved      PT LONG TERM GOAL #2   Title burning vaginally after intercourse decreaesd >/= 75% due to elongation of tissue    Time 12    Period Weeks    Status Partially Met      PT LONG TERM GOAL #3   Title understand correct toileting so she is not straining and putting pressure on the bladder    Time 12    Period Weeks    Status Achieved      PT LONG TERM GOAL #4   Title vaginal buring with lifting decreased >/= 75%    Time 12    Period Weeks    Status Partially Met                 Plan - 05/14/20 0809    Clinical Impression Statement Patient is doing her exercises, using vaginal moisturizers, and understands ways to manage her prolapse. Patient has not felt a significant change. She will be having a consult with Dr. Zigmund Daniel in November. Patient feels she is ready for discharge.    Personal Factors and Comorbidities Comorbidity 3+;Sex    Comorbidities fibromyalgia; melanoma; Chron's disease; s/p laparoscopic Vaginal Hysterectomy with Salpingo Oophorectomy on 08/27/2019    Examination-Activity Limitations Toileting;Lift    Examination-Participation Restrictions Interpersonal Relationship    Stability/Clinical Decision Making Evolving/Moderate complexity    Rehab Potential Excellent    PT Frequency --    PT Duration --    PT Treatment/Interventions ADLs/Self Care Home Management;Biofeedback;Cryotherapy;Moist Heat;Neuromuscular re-education;Therapeutic exercise;Therapeutic activities;Patient/family education;Manual techniques;Dry needling;Scar mobilization;Taping;Spinal Manipulations    PT Next Visit Plan Discharge to HEP      PT Home Exercise Plan Access Code: [email or web address]    Consulted and Agree with Plan of Care Patient           Patient will benefit from skilled therapeutic intervention in order to improve the following deficits and impairments:  Decreased coordination, Decreased range of motion, Increased fascial restricitons, Pain, Decreased activity tolerance, Decreased strength, Decreased scar mobility  Visit Diagnosis: Muscle weakness (generalized)  Other lack of coordination  Other muscle spasm     Problem List Patient Active Problem List   Diagnosis Date Noted  . S/P laparoscopic hysterectomy 08/28/2019  . Pelvic relaxation 08/27/2019  . S/P laparoscopic assisted vaginal hysterectomy (LAVH) 08/27/2019  . Flu-like symptoms 10/03/2018  . Sinusitis chronic, frontal 09/22/2018  . Bronchiectasis without complication ([place]) 01/75/1025  . Pulmonary nodule, right 07/11/2018  . Chest tightness 06/13/2017  . Fatigue 06/13/2017  . Chronic cough 05/31/2016    Earlie Counts, PT 05/14/20 11:53 AM   [facility] [address] [place], [place], [address] Phone: [phone]  Fax:  [phone]  Name: Jessica Bates MRN: [id number] Date of Birth: Aug 13, 1961  PHYSICAL THERAPY DISCHARGE SUMMARY  Visits from Start of Care: 4  Current functional level related to goals / functional outcomes: See above.   Remaining deficits: See above.    Education / Equipment: HEP Plan: Patient agrees to discharge.  Patient goals were partially met. Patient is being discharged due to the patient's request. Thank you for the referral. Earlie Counts, PT 05/14/20 11:54 AM   ?????

## 2020-05-19 ENCOUNTER — Telehealth: Payer: Self-pay | Admitting: Adult Health

## 2020-05-19 DIAGNOSIS — J479 Bronchiectasis, uncomplicated: Secondary | ICD-10-CM | POA: Diagnosis not present

## 2020-05-19 DIAGNOSIS — R05 Cough: Secondary | ICD-10-CM | POA: Diagnosis not present

## 2020-05-19 DIAGNOSIS — Z03818 Encounter for observation for suspected exposure to other biological agents ruled out: Secondary | ICD-10-CM | POA: Diagnosis not present

## 2020-05-19 NOTE — Telephone Encounter (Signed)
Patient complains over last week intermittent chest pressure and rib pain/pressure , worse at night . + GERD despite PPI Twice daily  . No rash, cough/congestion . No fever. Some hoarseness. Wants advice if she should go to [facility] for evaluation and chest xray . Has had PNA several times in past , even without cough at times Advised to go to [facility] for evaluation. Call our office if needed  Recently seen in office , advised to f/up as planned Please contact office for sooner follow up if symptoms do not improve or worsen or seek emergency care    Jasa Dundon NP-C

## 2020-05-20 ENCOUNTER — Encounter: Payer: Self-pay | Admitting: Internal Medicine

## 2020-05-20 ENCOUNTER — Telehealth: Payer: Self-pay | Admitting: Primary Care

## 2020-05-20 NOTE — Telephone Encounter (Signed)
Called and spoke with patient she states that she was not able to be seen at [facility] as it was way to busy. They did Covid test her yesterday and it was negative but [facility] was to busy for her to wait and be seen. Looks like she talked to Tammy yesterday about the same issue.   Dr. Chase Caller please advise patient is requesting to have a chest xray done as she has a history of Pneumonia without a cough and other symptoms

## 2020-05-20 NOTE — Telephone Encounter (Signed)
ATC patient to see if she can get copy of Negative covid results LMTCB

## 2020-05-20 NOTE — Telephone Encounter (Signed)
error 

## 2020-05-20 NOTE — Telephone Encounter (Signed)
If we can get negative covid test from [facility] < 1 week - then she can come with mask on and get CXR done with [place] this week

## 2020-05-21 DIAGNOSIS — K2100 Gastro-esophageal reflux disease with esophagitis, without bleeding: Secondary | ICD-10-CM | POA: Diagnosis not present

## 2020-05-21 DIAGNOSIS — J479 Bronchiectasis, uncomplicated: Secondary | ICD-10-CM | POA: Diagnosis not present

## 2020-05-21 DIAGNOSIS — R0789 Other chest pain: Secondary | ICD-10-CM | POA: Diagnosis not present

## 2020-05-21 NOTE — Telephone Encounter (Signed)
LMTCB

## 2020-05-22 NOTE — Telephone Encounter (Signed)
Spoke with the pt  She states had cxr done at [facility] that was reported to her as clear on 05/23/20  She was told to call and schedule f/u with pulm  She had negative covid test done 05/19/20  She was offered several appt options today but states would have to call back to schedule

## 2020-05-30 ENCOUNTER — Other Ambulatory Visit: Payer: Self-pay

## 2020-05-30 ENCOUNTER — Encounter: Payer: Self-pay | Admitting: Adult Health

## 2020-05-30 ENCOUNTER — Ambulatory Visit: Payer: BC Managed Care – PPO | Admitting: Adult Health

## 2020-05-30 ENCOUNTER — Other Ambulatory Visit: Payer: Self-pay | Admitting: Physician Assistant

## 2020-05-30 DIAGNOSIS — R1010 Upper abdominal pain, unspecified: Secondary | ICD-10-CM | POA: Diagnosis not present

## 2020-05-30 DIAGNOSIS — J479 Bronchiectasis, uncomplicated: Secondary | ICD-10-CM

## 2020-05-30 DIAGNOSIS — K219 Gastro-esophageal reflux disease without esophagitis: Secondary | ICD-10-CM | POA: Diagnosis not present

## 2020-05-30 NOTE — Assessment & Plan Note (Signed)
Appears to be under good control.  CT chest November 2020 showed no acute process.  Recent chest x-ray was unremarkable with clear lungs.  Advised her to continue with her pulmonary hygiene regimen.  Add flutter valve at least twice a day. Posterior right-sided pain questionable etiology does not appear to be pulmonary in nature.  However advised if continues and GI work-up is unrevealing.  We will continue to evaluate further if needed  Plan  Patient Instructions  Continue on Symbicort 80 2 puffs twice daily, rinse after use Continue on zyrtec and nasacort .  Mucinex DM twice daily as needed for cough and congestion Use flutter valve 2 times As needed    Follow up with Dr. Chase Caller in 6 months and As needed   Please contact office for sooner follow up if symptoms do not improve or worsen or seek emergency care

## 2020-05-30 NOTE — Patient Instructions (Signed)
Continue on Symbicort 80 2 puffs twice daily, rinse after use Continue on zyrtec and nasacort .  Mucinex DM twice daily as needed for cough and congestion Use flutter valve 2 times As needed    Follow up with Dr. Chase Caller in 6 months and As needed   Please contact office for sooner follow up if symptoms do not improve or worsen or seek emergency care

## 2020-05-30 NOTE — Progress Notes (Signed)
@Patient  ID: Jessica Bates, female    DOB: [birth year], 59 y.o.   MRN: [id number]  Chief Complaint  Patient presents with  . Follow-up    Bronchiectasis     Referring provider: Hulan Fess, MD  HPI: 59 year old female former smoker followed for chronic cough and bronchiectasis Medical history significant for Crohn's disease and Raynaud's syndrome  TEST/EVENTS :  PFTs>> 06/14/2017>> mild obstruction>> slightly decreased DLCO  FENO>> 06/13/2017>>18 ppb  CXR>>06/13/2017>>No active cardiopulmonary disease.  CT Chest 05/17/2017>Continued improvement in post inflammatory scarring in the RIGHT middle lobe and lingula. Stable small pulmonary nodule.  CT chest October 2019 showed stable 4 mm right middle lobe nodule. Similar appearance of mild right middle lobe and lingular bronchiectasis and small airway impaction.  05/30/2020 Follow up : Bronchiectasis Patient returns for follow-up visit.  Patient says overall her bronchiectasis been doing well.  She has had no flare of cough.  She does get some thick mucus intermittently.  She does use flutter valve on occasion.  She remains on Symbicort Nasacort and Zyrtec. She does complain over the last month that she has had some mid right-sided back pain that has been intermittent.  She has had an evaluation with her primary care provider and GI.  She is going to be undergoing an abdominal ultrasound for further evaluation.  She has been recommended take her Prilosec twice daily.  Patient denies any pleuritic pain.  No increased cough congestion.  Chest x-ray showed clear lungs.  Patient denies any hemoptysis exertional chest pain abdominal pain.  She has been having some slight increased reflux.  She is also taking Aleve.  We did discuss avoiding nonsteroidals if possible.    Allergies  Allergen Reactions  . Cymbalta [Duloxetine Hcl]     flushing  . Remicade [Infliximab]     Flushing  . Penicillins Rash    Did it involve swelling of the  face/tongue/throat, SOB, or low BP? No Did it involve sudden or severe rash/hives, skin peeling, or any reaction on the inside of your mouth or nose? No Did you need to seek medical attention at a hospital or doctor's office? No When did it last happen?30 + years If all above answers are "NO", may proceed with cephalosporin use.     Immunization History  Administered Date(s) Administered  . Influenza Split 06/13/2018  . Influenza,inj,Quad PF,6+ Mos 06/14/2015, 06/13/2016, 06/15/2019  . Influenza-Unspecified 06/12/2017  . Janssen (J&J) SARS-COV-2 Vaccination 11/23/2019  . Pneumococcal Conjugate-13 12/31/2016  . Pneumococcal Polysaccharide-23 12/31/2016    Past Medical History:  Diagnosis Date  . Asthma    bronchiectesis  . Crohn disease ([place])   . Depression meopause  . Dyslipidemia   . Fibromyalgia   . GERD (gastroesophageal reflux disease)   . Hemorrhoids   . Hiatal hernia   . Melanoma ([place])   . Pulmonary nodules     Tobacco History: Social History   Tobacco Use  Smoking Status Former Smoker  . Packs/day: 1.00  . Years: 9.00  . Pack years: 9.00  . Types: Cigarettes  . Quit date: 09/13/1998  . Years since quitting: 21.7  Smokeless Tobacco Never Used   Counseling given: Not Answered   Outpatient Medications Prior to Visit  Medication Sig Dispense Refill  . acyclovir (ZOVIRAX) 400 MG tablet Take 400 mg by mouth daily.    Marland Kitchen ALPRAZolam (XANAX) 0.25 MG tablet Take 0.125 mg by mouth daily as needed for anxiety.     Marland Kitchen aspirin-acetaminophen-caffeine (EXCEDRIN MIGRAINE) 250-250-65 MG tablet  Take 2 tablets by mouth every 6 (six) hours as needed for headache.    Marland Kitchen atorvastatin (LIPITOR) 40 MG tablet Take 40 mg by mouth at bedtime.     . budesonide-formoterol (SYMBICORT) 80-4.5 MCG/ACT inhaler INHALE 2 PUFFS BY MOUTH TWICE DAILY 10.2 g 5  . cetirizine (ZYRTEC) 10 MG tablet Take 10 mg by mouth daily.    . cholecalciferol (VITAMIN D3) 25 MCG (1000 UT) tablet Take 1,000  Units by mouth daily.    Marland Kitchen conjugated estrogens (PREMARIN) vaginal cream Place 1 Applicatorful vaginally daily.    Marland Kitchen estradiol (VIVELLE-DOT) 0.05 MG/24HR patch Place 1 patch onto the skin 2 (two) times a week.    Marland Kitchen LINZESS 145 MCG CAPS capsule Take 145 mcg by mouth daily.     . mesalamine (APRISO) 0.375 g 24 hr capsule Take 750 mg by mouth daily.     . methocarbamol (ROBAXIN) 500 MG tablet Take 500 mg by mouth daily as needed (headaches).   1  . montelukast (SINGULAIR) 10 MG tablet Take 1 tablet (10 mg total) by mouth at bedtime. 30 tablet 11  . naproxen sodium (ALEVE) 220 MG tablet Take 220 mg by mouth 2 (two) times daily as needed (pain).    Marland Kitchen omeprazole (PRILOSEC) 20 MG capsule Take 20 mg by mouth daily as needed (acid reflux).    Marland Kitchen Respiratory Therapy Supplies (FLUTTER) DEVI Use flutter valve 3 times a day 1 each 0  . Zinc 30 MG CAPS Take 30 mg by mouth daily.    Marland Kitchen zolpidem (AMBIEN) 10 MG tablet Take 10 mg by mouth at bedtime.     No facility-administered medications prior to visit.     Review of Systems:   Constitutional:   No  weight loss, night sweats,  Fevers, chills, fatigue, or  lassitude.  HEENT:   No headaches,  Difficulty swallowing,  Tooth/dental problems, or  Sore throat,                No sneezing, itching, ear ache, + nasal congestion, post nasal drip,   CV:  No chest pain,  Orthopnea, PND, swelling in lower extremities, anasarca, dizziness, palpitations, syncope.   GI  No heartburn, indigestion, abdominal pain, nausea, vomiting, diarrhea, change in bowel habits, loss of appetite, bloody stools.   Resp:  .  No chest wall deformity  Skin: no rash or lesions.  GU: no dysuria, change in color of urine, no urgency or frequency.  No flank pain, no hematuria   MS:  No joint pain or swelling.  No decreased range of motion.  + back pain.    Physical Exam  BP 106/64 (BP Location: Left Arm, Cuff Size: Normal)   Pulse 72   Temp (!) 96.5 F (35.8 C) (Temporal)   Ht  5' 6"  (1.676 m)   Wt 123 lb 6.4 oz (56 kg)   LMP 02/14/2014 Comment: bcp//A.C.  SpO2 100% Comment: RA  BMI 19.92 kg/m   GEN: A/Ox3; pleasant , NAD, well nourished    HEENT:  [place]/AT,   , NOSE-clear, THROAT-clear, no lesions, no postnasal drip or exudate noted.   NECK:  Supple w/ fair ROM; no JVD; normal carotid impulses w/o bruits; no thyromegaly or nodules palpated; no lymphadenopathy.    RESP  Clear  P & A; w/o, wheezes/ rales/ or rhonchi. no accessory muscle use, no dullness to percussion, posterior thorax nontender.  No rash noted.  No pleuritic pain on deep inspiration or forced expiration.  CARD:  RRR,  no m/r/g, no peripheral edema, pulses intact, no cyanosis or clubbing.  GI:   Soft & nt; nml bowel sounds; no organomegaly or masses detected.   Musco: Warm bil, no deformities or joint swelling noted.   Neuro: alert, no focal deficits noted.    Skin: Warm, no lesions or rashes    Lab Results:   BMET  BNP No results found for: BNP  ProBNP No results found for: PROBNP  Imaging: No results found.    PFT Results Latest Ref Rng & Units 06/14/2017  FVC-Pre L 3.15  FVC-Predicted Pre % 88  FVC-Post L 3.14  FVC-Predicted Post % 88  Pre FEV1/FVC % % 72  Post FEV1/FCV % % 77  FEV1-Pre L 2.27  FEV1-Predicted Pre % 82  FEV1-Post L 2.43  DLCO uncorrected ml/min/mmHg 16.68  DLCO UNC% % 65  DLCO corrected ml/min/mmHg 16.11  DLCO COR %Predicted % 62  DLVA Predicted % 73  TLC L 5.02  TLC % Predicted % 96  RV % Predicted % 103    Lab Results  Component Value Date   NITRICOXIDE 18 06/13/2017        Assessment & Plan:   Bronchiectasis without complication ([place]) Appears to be under good control.  CT chest November 2020 showed no acute process.  Recent chest x-ray was unremarkable with clear lungs.  Advised her to continue with her pulmonary hygiene regimen.  Add flutter valve at least twice a day. Posterior right-sided pain questionable etiology does not appear  to be pulmonary in nature.  However advised if continues and GI work-up is unrevealing.  We will continue to evaluate further if needed  Plan  Patient Instructions  Continue on Symbicort 80 2 puffs twice daily, rinse after use Continue on zyrtec and nasacort .  Mucinex DM twice daily as needed for cough and congestion Use flutter valve 2 times As needed    Follow up with Dr. Chase Caller in 6 months and As needed   Please contact office for sooner follow up if symptoms do not improve or worsen or seek emergency care            Rexene Edison, NP 05/30/2020

## 2020-06-04 ENCOUNTER — Ambulatory Visit
Admission: RE | Admit: 2020-06-04 | Discharge: 2020-06-04 | Disposition: A | Discharge status: Home / Self Care | Payer: BC Managed Care – PPO | Source: Ambulatory Visit | Attending: Physician Assistant | Admitting: Physician Assistant

## 2020-06-04 DIAGNOSIS — R1010 Upper abdominal pain, unspecified: Secondary | ICD-10-CM | POA: Diagnosis not present

## 2020-06-26 DIAGNOSIS — Z23 Encounter for immunization: Secondary | ICD-10-CM | POA: Diagnosis not present

## 2020-07-08 DIAGNOSIS — G4700 Insomnia, unspecified: Secondary | ICD-10-CM | POA: Diagnosis not present

## 2020-07-08 DIAGNOSIS — F411 Generalized anxiety disorder: Secondary | ICD-10-CM | POA: Diagnosis not present

## 2020-07-30 DIAGNOSIS — K50919 Crohn's disease, unspecified, with unspecified complications: Secondary | ICD-10-CM | POA: Diagnosis not present

## 2020-07-30 DIAGNOSIS — N812 Incomplete uterovaginal prolapse: Secondary | ICD-10-CM | POA: Diagnosis not present

## 2020-07-30 DIAGNOSIS — N9419 Other specified dyspareunia: Secondary | ICD-10-CM | POA: Diagnosis not present

## 2020-08-06 DIAGNOSIS — Z1231 Encounter for screening mammogram for malignant neoplasm of breast: Secondary | ICD-10-CM | POA: Diagnosis not present

## 2020-08-06 DIAGNOSIS — Z6820 Body mass index (BMI) 20.0-20.9, adult: Secondary | ICD-10-CM | POA: Diagnosis not present

## 2020-08-06 DIAGNOSIS — Z01419 Encounter for gynecological examination (general) (routine) without abnormal findings: Secondary | ICD-10-CM | POA: Diagnosis not present

## 2020-08-20 DIAGNOSIS — K2100 Gastro-esophageal reflux disease with esophagitis, without bleeding: Secondary | ICD-10-CM | POA: Diagnosis not present

## 2020-08-20 DIAGNOSIS — K5090 Crohn's disease, unspecified, without complications: Secondary | ICD-10-CM | POA: Diagnosis not present

## 2020-08-21 DIAGNOSIS — M1811 Unilateral primary osteoarthritis of first carpometacarpal joint, right hand: Secondary | ICD-10-CM | POA: Diagnosis not present

## 2020-09-08 ENCOUNTER — Telehealth: Payer: Self-pay | Admitting: Internal Medicine

## 2020-09-08 NOTE — Telephone Encounter (Signed)
Spoke with the pt  Daughter has been staying with her for the holidays and just tested pos for covid  Pt currently not having any symptoms  I advised wait 5 days from date of exposure and go get tested and if pos she should call for possible [facility] referral  She verbalized understanding and will call if needed

## 2020-10-16 ENCOUNTER — Other Ambulatory Visit: Payer: Self-pay | Admitting: Primary Care

## 2020-11-07 ENCOUNTER — Encounter: Payer: Self-pay | Admitting: Primary Care

## 2020-11-07 ENCOUNTER — Telehealth: Payer: Self-pay | Admitting: Adult Health

## 2020-11-07 ENCOUNTER — Ambulatory Visit (INDEPENDENT_AMBULATORY_CARE_PROVIDER_SITE_OTHER): Payer: BC Managed Care – PPO | Admitting: Primary Care

## 2020-11-07 DIAGNOSIS — J479 Bronchiectasis, uncomplicated: Secondary | ICD-10-CM

## 2020-11-07 MED ORDER — PREDNISONE 10 MG PO TABS: ORAL_TABLET | ORAL | 0 refills | Status: DC

## 2020-11-07 MED ORDER — DOXYCYCLINE HYCLATE 100 MG PO TABS: 100.0000 mg | ORAL_TABLET | Freq: Two times a day (BID) | ORAL | 0 refills | Status: DC

## 2020-11-07 NOTE — Patient Instructions (Addendum)
Recommendations: - Continue Symbicort 80, Singulair, Mucinex DM/flutter valve as needed - Take prednisone taper as directed - Sending in doxycycline to have on hand for increased mucus production, purulent mucus or fevers  (take with food and wear sunscreen if outside in direct sunlight)  Rx: - Prednisone taper as directed - Doxy 1 tab twice daily x 7 days   Follow-up:  - As needed if symptoms do not improve

## 2020-11-07 NOTE — Telephone Encounter (Signed)
Sorry to hear she is sick.  Needs Covid 19 testing   Can set up with phone /video visit if any openings with providers today , if none I have openings on Monday to be seen   Mucinex As needed  Cough/congestion  Saline nasal rinses As needed     Please contact office for sooner follow up if symptoms do not improve or worsen or seek emergency care

## 2020-11-07 NOTE — Telephone Encounter (Signed)
Spoke with the pt  She is c/o HA x 2 wks and states she started having SOB 1 wk ago  She states she feels as if she can not take a good, deep breath in She denies any cough or wheezing  She has not had any f/c/s, body aches, sore throat  She states still using her flutter valve, symbicort, nasacord and zyrtec daily Has had covid vaccines- [employer] and then [employer]  Please advise, thanks!

## 2020-11-07 NOTE — Telephone Encounter (Signed)
Spoke with the pt and notified of response per Tammy. She verbalized understanding. She has at home covid test and will take one and is scheduled for televisit with Beth for 4:30 pm today.

## 2020-11-07 NOTE — Progress Notes (Signed)
Virtual Visit via Telephone Note  I connected with Jessica Bates on 11/07/20 at  4:30 PM EST by telephone and verified that I am speaking with the correct person using two identifiers.  Location: Patient: Home Provider: Office   I discussed the limitations, risks, security and privacy concerns of performing an evaluation and management service by telephone and the availability of in person appointments. I also discussed with the patient that there may be a patient responsible charge related to this service. The patient expressed understanding and agreed to proceed.   History of Present Illness: 60 year old female, former smoker. PMH significant for  bronchiectasis, chronic sinusitis, pulmonary nodules. Patient of Dr. Chase Caller, last seen by pulmonary NP on 05/30/20 for a regular follow-up. Maintained on Symbicort 80, Singulair, Mucinex DM as needed and flutter valve.   11/07/2020 Patient contacted today for acute televisit. She reports increased shortness of breath over the last week. Associated headache. At times she has a difficult times taking a full deep breath and experiences chest tightness. She des not have a cough. Headache is not uncommon for her, excedrin has not helped. Home covid test was negative. Denies f/c/s, neuro changes, weakness, purulent mucus, hemoptysis, GI symptoms.    Observations/Objective:  - Able to speak in full sentences; no overt shortness of breath or wheezing    Pulmonary testing :  PFTs>> 06/14/2017>> mild obstruction>> slightly decreased DLCO  FENO>> 06/13/2017>>18 ppb  Imaging:  >> CT Chest 05/17/2017>Continued improvement in post inflammatory scarring in the RIGHT middle lobe and lingula. Stable small pulmonary nodule.  >> CT chest October 2019 showed stable 4 mm right middle lobe nodule. Similar appearance of mild right middle lobe and lingular bronchiectasis and small airway impaction.  >> 08/08/19 CT chest - No active disease; stable 78m RUL  pulmonary nodule consistent with benign etiology   Assessment and Plan:  Obstructive Bronchiectasis:  - Increased sob/chest tightness x 1 week. No cough.  - Continue Symbicort 863mtwice daily, Singulair, Mucinex and Flutter valve prn  - Rx prednisone taper 4052m 2 days; 63m22m2 days; 20mg69m days; 10mg 8mdays - Doxycyline to have on hand if she develop increased mucus production, purulence or fevers  Follow Up Instructions:   - As needed if symptoms do not improve or worsen   I discussed the assessment and treatment plan with the patient. The patient was provided an opportunity to ask questions and all were answered. The patient agreed with the plan and demonstrated an understanding of the instructions.   The patient was advised to call back or seek an in-person evaluation if the symptoms worsen or if the condition fails to improve as anticipated.  I provided 18 minutes of non-face-to-face time during this encounter.   ElizabMartyn Ehrich

## 2020-11-18 DIAGNOSIS — M1811 Unilateral primary osteoarthritis of first carpometacarpal joint, right hand: Secondary | ICD-10-CM | POA: Diagnosis not present

## 2020-11-27 ENCOUNTER — Other Ambulatory Visit: Payer: Self-pay | Admitting: Primary Care

## 2020-12-12 DIAGNOSIS — K5090 Crohn's disease, unspecified, without complications: Secondary | ICD-10-CM | POA: Diagnosis not present

## 2020-12-15 ENCOUNTER — Other Ambulatory Visit: Payer: Self-pay | Admitting: Gastroenterology

## 2020-12-15 DIAGNOSIS — R1084 Generalized abdominal pain: Secondary | ICD-10-CM

## 2020-12-15 DIAGNOSIS — R1010 Upper abdominal pain, unspecified: Secondary | ICD-10-CM | POA: Diagnosis not present

## 2020-12-29 ENCOUNTER — Other Ambulatory Visit: Payer: Self-pay

## 2020-12-29 ENCOUNTER — Ambulatory Visit
Admission: RE | Admit: 2020-12-29 | Discharge: 2020-12-29 | Disposition: A | Discharge status: Home / Self Care | Payer: BC Managed Care – PPO | Source: Ambulatory Visit | Attending: Gastroenterology | Admitting: Gastroenterology

## 2020-12-29 DIAGNOSIS — R1084 Generalized abdominal pain: Secondary | ICD-10-CM

## 2020-12-29 DIAGNOSIS — I700 Atherosclerosis of aorta: Secondary | ICD-10-CM | POA: Diagnosis not present

## 2020-12-29 DIAGNOSIS — Z90710 Acquired absence of both cervix and uterus: Secondary | ICD-10-CM | POA: Diagnosis not present

## 2020-12-29 MED ORDER — IOPAMIDOL (ISOVUE-300) INJECTION 61%
100.0000 mL | Freq: Once | INTRAVENOUS | Status: AC | PRN
  Administered 2020-12-29: 100 mL via INTRAVENOUS

## 2021-01-12 DIAGNOSIS — F411 Generalized anxiety disorder: Secondary | ICD-10-CM | POA: Diagnosis not present

## 2021-01-12 DIAGNOSIS — G4700 Insomnia, unspecified: Secondary | ICD-10-CM | POA: Diagnosis not present

## 2021-01-26 ENCOUNTER — Other Ambulatory Visit: Payer: Self-pay

## 2021-01-26 ENCOUNTER — Ambulatory Visit (INDEPENDENT_AMBULATORY_CARE_PROVIDER_SITE_OTHER): Payer: BC Managed Care – PPO | Admitting: Primary Care

## 2021-01-26 ENCOUNTER — Telehealth: Payer: Self-pay | Admitting: Primary Care

## 2021-01-26 DIAGNOSIS — J301 Allergic rhinitis due to pollen: Secondary | ICD-10-CM

## 2021-01-26 DIAGNOSIS — J321 Chronic frontal sinusitis: Secondary | ICD-10-CM | POA: Diagnosis not present

## 2021-01-26 MED ORDER — PREDNISONE 10 MG PO TABS: ORAL_TABLET | ORAL | 0 refills | Status: DC

## 2021-01-26 NOTE — Patient Instructions (Signed)
-   Continue allegra D, mucinex BID and Nasacort nasal spray  - Stop Afrin. Adding ocean nasal spray twice daily  - Rx prednisone taper as directed (69m x 3 days; 370mx 3 days; 2065m 3 days; 21m73m3 days) - Holding off on abx at this time, she will notify our office if she develops purulent sinus drainage or cough   Follow Up Instructions:  - As needed if symptoms do not improve

## 2021-01-26 NOTE — Progress Notes (Signed)
Virtual Visit via Telephone Note  I connected with Jessica Bates on 01/26/21 at 11:30 AM EDT by telephone and verified that I am speaking with the correct person using two identifiers.  Location: Patient: Home Provider: Office    I discussed the limitations, risks, security and privacy concerns of performing an evaluation and management service by telephone and the availability of in person appointments. I also discussed with the patient that there may be a patient responsible charge related to this service. The patient expressed understanding and agreed to proceed.   History of Present Illness: 60 year old female, former smoker. PMH significant for  bronchiectasis, chronic sinusitis, pulmonary nodules. Patient of Dr. Chase Caller. Maintained on Symbicort 80, Singulair, Mucinex DM as needed and flutter valve.   Previous [facility] pulmonary encounter:  11/07/2020 Patient contacted today for acute televisit. She reports increased shortness of breath over the last week. Associated headache. At times she has a difficult times taking a full deep breath and experiences chest tightness. She des not have a cough. Headache is not uncommon for her, excedrin has not helped. Home covid test was negative. Denies f/c/s, neuro changes, weakness, purulent mucus, hemoptysis, GI symptoms.    01/26/2021- Interim hx  She reports having sinus congestion x 1 week. She has associated eye puffiness, headaches, ear fullness, PND. She is not getting up any purulent mucus. She is taking allegra D and mucinex. She is also using Nasacort and afrin nasal sprays. She took prednisone that she had on hand last week because he daughter was getting married. Yesterday her symptoms came back. Denies f/c/s, chest tightness or shortness of breath .   Observations/Objective:  - Able to speak in full sentences; no overt shortness of breath, wheezing or cough  Assessment and Plan:  Allergic rhinitis/ Chronic frontal sinusitis:  - Sinus  congestion/PND x 1 week; No purulent mucus production or shortness of breath  - Continue allegra D, mucinex BID and Nasacort nasal spray  - Stop Afrin. Adding ocean nasal spray twice daily  - Rx prednisone taper as directed (33m x 3 days; 336mx 3 days; 205m 3 days; 60m63m3 days) - Holding off on abx at this time, she will notify our office if she develops purulent sinus drainage or cough   Follow Up Instructions:  - As needed if symptoms do not improve    I discussed the assessment and treatment plan with the patient. The patient was provided an opportunity to ask questions and all were answered. The patient agreed with the plan and demonstrated an understanding of the instructions.   The patient was advised to call back or seek an in-person evaluation if the symptoms worsen or if the condition fails to improve as anticipated.  I provided 22 minutes of non-face-to-face time during this encounter.   Jessica Bates

## 2021-01-26 NOTE — Telephone Encounter (Signed)
Put her on my schedule this afternoon for televisit

## 2021-01-26 NOTE — Telephone Encounter (Signed)
Called and spoke with patient. She did not want to wait until 330pm this afternoon. Beth had an opening at 1130am and the patient wanted it. She has been scheduled for 1130am with Beth.   Nothing further needed at time of call.

## 2021-01-26 NOTE — Telephone Encounter (Signed)
Called and spoke with patient who states that she is feeling pressure in her chest and behind her eyes. Eyes are swollen as well. Feeling quite unwell and was told to call when she is sick. Would like a perscription to help.  Sinus pressure and chest pressure started about a week ago and has gotten worse. No productive cough,no fevers.  Tammy please advise

## 2021-02-04 ENCOUNTER — Telehealth: Payer: Self-pay | Admitting: Primary Care

## 2021-02-04 MED ORDER — DOXYCYCLINE HYCLATE 100 MG PO TABS: 100.0000 mg | ORAL_TABLET | Freq: Two times a day (BID) | ORAL | 0 refills | Status: DC

## 2021-02-04 NOTE — Telephone Encounter (Signed)
Sorry she is not better, reasonable to try abx for persistent sinusitis symptoms.  Please send in Doxycycline 12m BID x 7 days

## 2021-02-04 NOTE — Telephone Encounter (Signed)
Called and left message on voicemail to please return phone call. Contact number provided. Will try to call again.

## 2021-02-04 NOTE — Telephone Encounter (Signed)
Spoke with the pt  She had televisit 01/26/21 with Beth- AVS:  Instructions    Return if symptoms worsen or fail to improve.  - Continue allegra D, mucinex BID and Nasacort nasal spray  - Stop Afrin. Adding ocean nasal spray twice daily  - Rx prednisone taper as directed (64m x 3 days; 356mx 3 days; 2033m 3 days; 89m58m3 days) - Holding off on abx at this time, she will notify our office if she develops purulent sinus drainage or cough   Follow Up Instructions:  - As needed if symptoms do not improve        She states started on her pred 10 mg today, has 2 doses remaining  Taking all other meds as instructed as well  She thought the she was doing better with head and chest congestion, but it's been worse x 2 days  She is feeling a pressure in her chest and having sinus pressure as well  She states feels she has sinus infection bc she has started to cough up green sputum x 2 days  No f/c/s,body aches She feels she may need abx  Please advise thanks- she has PCN allergy

## 2021-02-04 NOTE — Telephone Encounter (Signed)
Tried calling the pt and there was no answer- LMTCB.  

## 2021-02-04 NOTE — Telephone Encounter (Signed)
Called and left message on voicemail to please return phone call. Contact number provided. Will attempt to call again

## 2021-02-04 NOTE — Telephone Encounter (Signed)
Patient returned phone call, name and birth date confirmed. Went over [employer] [facility] NP response and recommendations.  Order sent in for Doxycycline. Patient educated on medication and how to take it. Patient expressed full understanding with teach back. Nothing further needed at this time.

## 2021-03-13 ENCOUNTER — Telehealth: Payer: Self-pay | Admitting: Primary Care

## 2021-03-13 DIAGNOSIS — J019 Acute sinusitis, unspecified: Secondary | ICD-10-CM | POA: Diagnosis not present

## 2021-03-13 MED ORDER — LEVOFLOXACIN 500 MG PO TABS: 500.0000 mg | ORAL_TABLET | Freq: Every day | ORAL | 0 refills | Status: DC

## 2021-03-13 MED ORDER — DOXYCYCLINE HYCLATE 100 MG PO TABS: 100.0000 mg | ORAL_TABLET | Freq: Two times a day (BID) | ORAL | 0 refills | Status: DC

## 2021-03-13 NOTE — Telephone Encounter (Signed)
Message was not routed to me. That's fine with me, please discontinue doxycycline order and add to allergy list and list it as in intolerance. Send in levaquin 560m qd x 7 days.

## 2021-03-13 NOTE — Telephone Encounter (Signed)
She has an allergy to PCN I believe? This would be next recommended. Needs to wear sunscreen when outside and avoid direct sunlight while taking

## 2021-03-13 NOTE — Telephone Encounter (Signed)
Pt notified of response per Beth. Nothing further needed.

## 2021-03-13 NOTE — Telephone Encounter (Signed)
Spoke with the pt and made aware of recommendations per [facility]. She appreciated abx being sent, but asks that we send something other than doxy. She states last time she took this med it caused photosensitivity. Please advise. I have updated her allergies and they are listed below:  Allergies  Allergen Reactions   Cymbalta [Duloxetine Hcl]     flushing   Doxycycline Photosensitivity   Remicade [Infliximab]     Flushing   Penicillins Rash    Did it involve swelling of the face/tongue/throat, SOB, or low BP? No Did it involve sudden or severe rash/hives, skin peeling, or any reaction on the inside of your mouth or nose? No Did you need to seek medical attention at a hospital or doctor's office? No When did it last happen?      30 + years If all above answers are "NO", may proceed with cephalosporin use.

## 2021-03-13 NOTE — Telephone Encounter (Signed)
Patient called and stated that she had spoke earlier with a nurse regarding the Doxy. She stated she told them earlier that she is not comfortable taking it as last time it gave her a rash/burn even with sunscreen and she is not going to pick it up. She would like to speak directly to [facility] NP or another provider. I informed her that I would ask Beth to call but she is seeing patients in the office so may take a bit, patient verbalized understanding. Will route to [facility] for recs.  Beth, please advise. Thanks!

## 2021-03-13 NOTE — Telephone Encounter (Signed)
I will send in abx for suspected bronchiectasis exacerbations. If symptoms worsen she needs to present to ED. She should be taking mucinex 600-1281m BID. If she has a flutter make sure she is using that

## 2021-03-13 NOTE — Telephone Encounter (Signed)
Last seen Jessica Bates on  01/26/2021 for chronic sinuitis. No pending appt.   Patient feels that she has developed a cold. C/o prod cough with yellow sputum, increased sob with exertion, right side chest discomfort under breast line and chest/nasal congestion x6d Denies fever, chills or sweats. Fully vaccinated against covid and flu.  She is using Symbicort BID, mucinex BID and allegra D once daily with no relief in sx.. She does not have an albuterol inhaler.  Negative home covid test on Wednesday.  Jessica Bates, please advise. Thanks

## 2021-03-13 NOTE — Telephone Encounter (Deleted)
Yes, she is allergic to PCN. She is asking for something else to be called in other than doxycycline.

## 2021-03-13 NOTE — Telephone Encounter (Signed)
I have called the pt and she is aware that the levaquin has been sent to the pharmacy. The doxy has been added to her allergy list.

## 2021-03-15 DIAGNOSIS — Z20822 Contact with and (suspected) exposure to covid-19: Secondary | ICD-10-CM | POA: Diagnosis not present

## 2021-04-13 DIAGNOSIS — Z85820 Personal history of malignant melanoma of skin: Secondary | ICD-10-CM | POA: Diagnosis not present

## 2021-04-13 DIAGNOSIS — K5090 Crohn's disease, unspecified, without complications: Secondary | ICD-10-CM | POA: Diagnosis not present

## 2021-04-13 DIAGNOSIS — M797 Fibromyalgia: Secondary | ICD-10-CM | POA: Diagnosis not present

## 2021-04-13 DIAGNOSIS — E7800 Pure hypercholesterolemia, unspecified: Secondary | ICD-10-CM | POA: Diagnosis not present

## 2021-04-16 DIAGNOSIS — M1811 Unilateral primary osteoarthritis of first carpometacarpal joint, right hand: Secondary | ICD-10-CM | POA: Diagnosis not present

## 2021-05-13 DIAGNOSIS — D225 Melanocytic nevi of trunk: Secondary | ICD-10-CM | POA: Diagnosis not present

## 2021-05-13 DIAGNOSIS — L814 Other melanin hyperpigmentation: Secondary | ICD-10-CM | POA: Diagnosis not present

## 2021-05-13 DIAGNOSIS — Z85820 Personal history of malignant melanoma of skin: Secondary | ICD-10-CM | POA: Diagnosis not present

## 2021-05-13 DIAGNOSIS — L821 Other seborrheic keratosis: Secondary | ICD-10-CM | POA: Diagnosis not present

## 2021-05-28 DIAGNOSIS — R519 Headache, unspecified: Secondary | ICD-10-CM | POA: Diagnosis not present

## 2021-05-28 DIAGNOSIS — R0789 Other chest pain: Secondary | ICD-10-CM | POA: Diagnosis not present

## 2021-06-01 ENCOUNTER — Ambulatory Visit: Payer: BC Managed Care – PPO | Admitting: Adult Health

## 2021-06-01 ENCOUNTER — Other Ambulatory Visit: Payer: Self-pay

## 2021-06-01 ENCOUNTER — Encounter: Payer: Self-pay | Admitting: Adult Health

## 2021-06-01 VITALS — BP 110/76 | HR 78 | Temp 97.8°F | Ht 65.0 in | Wt 125.8 lb

## 2021-06-01 DIAGNOSIS — Z23 Encounter for immunization: Secondary | ICD-10-CM

## 2021-06-01 DIAGNOSIS — K219 Gastro-esophageal reflux disease without esophagitis: Secondary | ICD-10-CM | POA: Diagnosis not present

## 2021-06-01 DIAGNOSIS — J479 Bronchiectasis, uncomplicated: Secondary | ICD-10-CM | POA: Diagnosis not present

## 2021-06-01 NOTE — Patient Instructions (Signed)
Flu shot today .  Covid Booster later this fall.  Continue on Symbicort 80 2 puffs twice daily, rinse after use Continue on Zyrtec , Nasacort and Singulair .  Activity as tolerated.  Mucinex DM twice daily as needed for cough and congestion Use flutter valve 2 times As needed   Continue on Omeprazole 7m daily .  Add Pepcid 230mAt bedtime for 1 month  GERD diet  Follow up with Dr. RaChase Callern 6 months and As needed   Please contact office for sooner follow up if symptoms do not improve or worsen or seek emergency care

## 2021-06-01 NOTE — Progress Notes (Signed)
@Patient  ID: Ciro Backer, female    DOB: 25-Feb-1961, 60 y.o.   MRN: [id number]  Chief Complaint  Patient presents with   Follow-up    Referring provider: Hulan Fess, MD  HPI: 60 year old female former smoker followed for chronic cough and bronchiectasis Medical history significant for Crohn's disease and Raynaud's syndrome  TEST/EVENTS :  PFTs>> 06/14/2017>> mild obstruction>> slightly decreased DLCO   FENO>> 06/13/2017>>18 ppb   CXR>> 06/13/2017>> No active cardiopulmonary disease.    CT Chest 05/17/2017>Continued improvement in post inflammatory scarring in the RIGHT middle lobe and lingula. Stable small pulmonary nodule.   CT chest October 2019 showed stable 4 mm right middle lobe nodule.  Similar appearance of mild right middle lobe and lingular bronchiectasis and small airway impaction.  06/01/21 Follow up: Bronchiectasis Patient presents for a 1-monthfollow-up.  Patient has underlying bronchiectasis.  Feels that overall breathing is doing okay.  She feels at times that her cough will increase especially with the season changes or hot humid weather.  She remains on Symbicort twice daily.  She is also has some increased nasal congestion intermittently.  She remains on Zyrtec Singulair and Nasacort. She is doing her flutter valve twice daily. Does have some intermittent reflux.  She remains on omeprazole daily. She would like to get her flu shot today. She denies any chest pain orthopnea PND or hemoptysis.      Allergies  Allergen Reactions   Cymbalta [Duloxetine Hcl]     flushing   Doxycycline Photosensitivity   Remicade [Infliximab]     Flushing   Penicillins Rash    Did it involve swelling of the face/tongue/throat, SOB, or low BP? No Did it involve sudden or severe rash/hives, skin peeling, or any reaction on the inside of your mouth or nose? No Did you need to seek medical attention at a hospital or doctor's office? No When did it last happen?      30 +  years If all above answers are "NO", may proceed with cephalosporin use.     Immunization History  Administered Date(s) Administered   Influenza Split 06/13/2018, 06/15/2019   Influenza Whole 07/28/2020   Influenza,inj,Quad PF,6+ Mos 06/14/2015, 06/13/2016, 06/15/2019   Influenza-Unspecified 06/12/2017   Janssen (J&J) SARS-COV-2 Vaccination 11/23/2019   PFIZER(Purple Top)SARS-COV-2 Vaccination 06/15/2020, 07/06/2020, 10/25/2020   Pneumococcal Conjugate-13 12/31/2016   Pneumococcal Polysaccharide-23 12/31/2016   Tdap 06/19/2008, 06/15/2013    Past Medical History:  Diagnosis Date   Asthma    bronchiectesis   Crohn disease ([place]    Depression meopause   Dyslipidemia    Fibromyalgia    GERD (gastroesophageal reflux disease)    Hemorrhoids    Hiatal hernia    Melanoma ([place]    Pulmonary nodules     Tobacco History: Social History   Tobacco Use  Smoking Status Former   Packs/day: 1.00   Years: 9.00   Pack years: 9.00   Types: Cigarettes   Quit date: 09/13/1998   Years since quitting: 22.7  Smokeless Tobacco Never   Counseling given: Not Answered   Outpatient Medications Prior to Visit  Medication Sig Dispense Refill   acyclovir (ZOVIRAX) 400 MG tablet Take 400 mg by mouth daily.     ALPRAZolam (XANAX) 0.25 MG tablet Take 0.125 mg by mouth daily as needed for anxiety.      aspirin-acetaminophen-caffeine (EXCEDRIN MIGRAINE) 250-250-65 MG tablet Take 2 tablets by mouth every 6 (six) hours as needed for headache.     atorvastatin (LIPITOR) 40 MG  tablet Take 40 mg by mouth at bedtime.      budesonide-formoterol (SYMBICORT) 80-4.5 MCG/ACT inhaler INHALE 2 PUFFS BY MOUTH TWICE DAILY 10.2 g 6   cetirizine (ZYRTEC) 10 MG tablet Take 10 mg by mouth daily.     cholecalciferol (VITAMIN D3) 25 MCG (1000 UT) tablet Take 1,000 Units by mouth daily.     conjugated estrogens (PREMARIN) vaginal cream Place 1 Applicatorful vaginally daily.     estradiol (VIVELLE-DOT) 0.05 MG/24HR  patch Place 1 patch onto the skin 2 (two) times a week.     levofloxacin (LEVAQUIN) 500 MG tablet Take 1 tablet (500 mg total) by mouth daily. 7 tablet 0   LINZESS 145 MCG CAPS capsule Take 145 mcg by mouth daily.     mesalamine (APRISO) 0.375 g 24 hr capsule Take 750 mg by mouth daily.      methocarbamol (ROBAXIN) 500 MG tablet Take 500 mg by mouth daily as needed (headaches).   1   montelukast (SINGULAIR) 10 MG tablet Take 1 tablet (10 mg total) by mouth at bedtime. 30 tablet 11   naproxen sodium (ALEVE) 220 MG tablet Take 220 mg by mouth 2 (two) times daily as needed (pain).     omeprazole (PRILOSEC) 20 MG capsule Take 20 mg by mouth daily as needed (acid reflux).     predniSONE (DELTASONE) 10 MG tablet Take 4 tabs po daily x 3 days; then 3 tabs daily x3 days; then 2 tabs daily x3 days; then 1 tab daily x 3 days; then stop 30 tablet 0   Respiratory Therapy Supplies (FLUTTER) DEVI Use flutter valve 3 times a day 1 each 0   Zinc 30 MG CAPS Take 30 mg by mouth daily.     zolpidem (AMBIEN) 10 MG tablet Take 10 mg by mouth at bedtime.     No facility-administered medications prior to visit.     Review of Systems:   Constitutional:   No  weight loss, night sweats,  Fevers, chills, fatigue, or  lassitude.  HEENT:   No headaches,  Difficulty swallowing,  Tooth/dental problems, or  Sore throat,                No sneezing, itching, ear ache,  +nasal congestion, post nasal drip,   CV:  No chest pain,  Orthopnea, PND, swelling in lower extremities, anasarca, dizziness, palpitations, syncope.   GI  No heartburn, indigestion, abdominal pain, nausea, vomiting, diarrhea, change in bowel habits, loss of appetite, bloody stools.   Resp:  No chest wall deformity  Skin: no rash or lesions.  GU: no dysuria, change in color of urine, no urgency or frequency.  No flank pain, no hematuria   MS:  No joint pain or swelling.  No decreased range of motion.  No back pain.    Physical Exam  BP 110/76  (BP Location: Left Arm, Patient Position: Sitting, Cuff Size: Large)   Pulse 78   Temp 97.8 F (36.6 C) (Oral)   Ht 5' 5"  (1.651 m)   Wt 125 lb 12.8 oz (57.1 kg)   LMP 02/14/2014 Comment: bcp//A.C.  SpO2 99%   BMI 20.93 kg/m   GEN: A/Ox3; pleasant , NAD, well nourished    HEENT:  [place]/AT,   NOSE-clear, THROAT-clear, no lesions, no postnasal drip or exudate noted.   NECK:  Supple w/ fair ROM; no JVD; normal carotid impulses w/o bruits; no thyromegaly or nodules palpated; no lymphadenopathy.    RESP  Clear  P & A;  w/o, wheezes/ rales/ or rhonchi. no accessory muscle use, no dullness to percussion  CARD:  RRR, no m/r/g, no peripheral edema, pulses intact, no cyanosis or clubbing.  GI:   Soft & nt; nml bowel sounds; no organomegaly or masses detected.   Musco: Warm bil, no deformities or joint swelling noted.   Neuro: alert, no focal deficits noted.    Skin: Warm, no lesions or rashes    Lab Results:  CBC   BMET   BNP No results found for: BNP  ProBNP No results found for: PROBNP  Imaging: No results found.    PFT Results Latest Ref Rng & Units 06/14/2017  FVC-Pre L 3.15  FVC-Predicted Pre % 88  FVC-Post L 3.14  FVC-Predicted Post % 88  Pre FEV1/FVC % % 72  Post FEV1/FCV % % 77  FEV1-Pre L 2.27  FEV1-Predicted Pre % 82  FEV1-Post L 2.43  DLCO uncorrected ml/min/mmHg 16.68  DLCO UNC% % 65  DLCO corrected ml/min/mmHg 16.11  DLCO COR %Predicted % 62  DLVA Predicted % 73  TLC L 5.02  TLC % Predicted % 96  RV % Predicted % 103    Lab Results  Component Value Date   NITRICOXIDE 18 06/13/2017        Assessment & Plan:   No problem-specific Assessment & Plan notes found for this encounter.     Rexene Edison, NP 06/01/2021

## 2021-06-04 DIAGNOSIS — K219 Gastro-esophageal reflux disease without esophagitis: Secondary | ICD-10-CM | POA: Insufficient documentation

## 2021-06-04 NOTE — Assessment & Plan Note (Signed)
Appears stable  Check Chest xray today  Check PFT on return   Plan  Patient Instructions  Flu shot today .  Covid Booster later this fall.  Continue on Symbicort 80 2 puffs twice daily, rinse after use Continue on Zyrtec , Nasacort and Singulair .  Activity as tolerated.  Mucinex DM twice daily as needed for cough and congestion Use flutter valve 2 times As needed   Continue on Omeprazole 74m daily .  Add Pepcid 270mAt bedtime for 1 month  GERD diet  Follow up with Dr. RaChase Callern 6 months and As needed   Please contact office for sooner follow up if symptoms do not improve or worsen or seek emergency care

## 2021-06-04 NOTE — Assessment & Plan Note (Signed)
GERD diet  Add pepcid At bedtime   Cont on PPI

## 2021-06-08 ENCOUNTER — Telehealth: Payer: Self-pay | Admitting: Adult Health

## 2021-06-08 ENCOUNTER — Other Ambulatory Visit: Payer: Self-pay | Admitting: *Deleted

## 2021-06-08 ENCOUNTER — Telehealth: Payer: Self-pay | Admitting: *Deleted

## 2021-06-08 DIAGNOSIS — J479 Bronchiectasis, uncomplicated: Secondary | ICD-10-CM

## 2021-06-08 NOTE — Telephone Encounter (Signed)
Called and spoke with patient. She stated that she was returning call from TP from 06/04/21. She stated that per the VM, TP wanted to discuss several things with her. I advised her that I send a message to TP to let her know she has called back. She verbalized understanding.   TP, can you please advise? Thanks!

## 2021-06-08 NOTE — Telephone Encounter (Signed)
Called and spoke with patient, advised that Tammy wanted to get a CXR since she had not had one in over 1 year.  Patient stated she had one done at [facility] on the previous Friday.  She will contact [facility] and have them send it to [place].  Advised I would let Tammy know.  Advised that when she schedules her 6 month f/u with MR she will need to schedule a PFT to be done on the same day.  She verbalized understanding.  Nothing further needed.

## 2021-06-09 NOTE — Telephone Encounter (Signed)
Patient was called on 06/08/21, there is a telephone note there as well.  Called and spoke with patient, advised that Tammy wanted to get a CXR since she had not had one in over 1 year.  Patient stated she had one done at [facility] on the previous Friday.  She will contact [facility] and have them send it to [place].  Advised I would let Tammy know.  Advised that when she schedules her 6 month f/u with MR she will need to schedule a PFT to be done on the same day.  She verbalized understanding.  Nothing further needed.

## 2021-06-22 DIAGNOSIS — R0789 Other chest pain: Secondary | ICD-10-CM | POA: Diagnosis not present

## 2021-06-29 DIAGNOSIS — F411 Generalized anxiety disorder: Secondary | ICD-10-CM | POA: Diagnosis not present

## 2021-06-29 DIAGNOSIS — G4700 Insomnia, unspecified: Secondary | ICD-10-CM | POA: Diagnosis not present

## 2021-06-30 ENCOUNTER — Telehealth: Payer: Self-pay | Admitting: Adult Health

## 2021-06-30 NOTE — Telephone Encounter (Signed)
Tried calling pt back and there was no answer- LMTCB

## 2021-07-01 NOTE — Telephone Encounter (Signed)
Noted  

## 2021-07-01 NOTE — Telephone Encounter (Signed)
ATC LVMTCB X1  

## 2021-08-11 ENCOUNTER — Other Ambulatory Visit: Payer: Self-pay | Admitting: Primary Care

## 2021-08-11 DIAGNOSIS — Z6820 Body mass index (BMI) 20.0-20.9, adult: Secondary | ICD-10-CM | POA: Diagnosis not present

## 2021-08-11 DIAGNOSIS — Z01419 Encounter for gynecological examination (general) (routine) without abnormal findings: Secondary | ICD-10-CM | POA: Diagnosis not present

## 2021-08-11 DIAGNOSIS — Z1231 Encounter for screening mammogram for malignant neoplasm of breast: Secondary | ICD-10-CM | POA: Diagnosis not present

## 2021-08-13 ENCOUNTER — Other Ambulatory Visit (HOSPITAL_COMMUNITY): Payer: Self-pay

## 2021-08-13 ENCOUNTER — Telehealth: Payer: Self-pay | Admitting: Pharmacy Technician

## 2021-08-13 NOTE — Telephone Encounter (Signed)
Patient Advocate Encounter   Received notification from [facility] that prior authorization for Symbicort is required by his/her insurance [employer].  Per Test Claim: Ins prefers brand with a DAW 9. Called pharmacy to process. They said she just got it, but they'd file this RX for future fills.

## 2021-09-09 NOTE — Progress Notes (Signed)
Chief Complaint  Patient presents with   Follow-up    Heart murmur   History of Present Illness: 60 yo female with history of former tobacco abuse, hyperlipidemia, asthma, fibromyalgia, Crohn disease who is here today for cardiac follow up. I saw her in October 2018 as a new consult, referred by Dr. Rex Kras, for evaluation of chest pain. She had been followed in the pulmonary office following several bouts of pneumonia and she felt that her chest pain was due to this. She described pressure in her chest, mostly at rest. No change with exertion. She does have GERD and a hiatal hernia. Echo in November 2018 with LVEF=60-65% and no significant valve disease. Normal exercise stress test in November 2018. She smoked for about 15 years, stopping in 2000.   She is here today for follow up. The patient denies any chest pain, dyspnea, palpitations, lower extremity edema, orthopnea, PND, dizziness, near syncope or syncope. She is here today because she was told she had a murmur in primary care. She is feeling well. No recent bouts of pneumonia.   Primary Care Physician: Glenis Smoker, MD  Past Medical History:  Diagnosis Date   Asthma    bronchiectesis   Crohn disease ([place])    Depression meopause   Dyslipidemia    Fibromyalgia    GERD (gastroesophageal reflux disease)    Hemorrhoids    Hiatal hernia    Melanoma ([place])    Pulmonary nodules     Past Surgical History:  Procedure Laterality Date   ANTERIOR AND POSTERIOR REPAIR WITH SACROSPINOUS FIXATION N/A 08/27/2019   Procedure: ANTERIOR AND POSTERIOR REPAIR WITH SACROSPINOUS FIXATION;  Surgeon: Arvella Nigh, MD;  Location: [place];  Service: Gynecology;  Laterality: N/A;   colonscopy     LAPAROSCOPIC VAGINAL HYSTERECTOMY WITH SALPINGO OOPHORECTOMY Bilateral 08/27/2019   Procedure: LAPAROSCOPIC ASSISTED VAGINAL HYSTERECTOMY WITH SALPINGO OOPHORECTOMY;  Surgeon: Arvella Nigh, MD;  Location: [place];   Service: Gynecology;  Laterality: Bilateral;  NEED BED   MELANOMA EXCISION      Current Outpatient Medications  Medication Sig Dispense Refill   acyclovir (ZOVIRAX) 400 MG tablet Take 400 mg by mouth daily.     albuterol (VENTOLIN HFA) 108 (90 Base) MCG/ACT inhaler      ALPRAZolam (XANAX) 0.25 MG tablet Take 0.125 mg by mouth daily as needed for anxiety.      aspirin-acetaminophen-caffeine (EXCEDRIN MIGRAINE) 250-250-65 MG tablet Take 2 tablets by mouth every 6 (six) hours as needed for headache.     atorvastatin (LIPITOR) 40 MG tablet Take 40 mg by mouth at bedtime.      budesonide-formoterol (SYMBICORT) 80-4.5 MCG/ACT inhaler INHALE 2 PUFFS BY MOUTH TWICE DAILY 10.2 g 6   cetirizine (ZYRTEC) 10 MG tablet Take 10 mg by mouth daily.     cholecalciferol (VITAMIN D3) 25 MCG (1000 UT) tablet Take 1,000 Units by mouth daily.     conjugated estrogens (PREMARIN) vaginal cream Place 1 Applicatorful vaginally daily.     estradiol (VIVELLE-DOT) 0.05 MG/24HR patch Place 1 patch onto the skin 2 (two) times a week.     LINZESS 145 MCG CAPS capsule Take 145 mcg by mouth daily.     mesalamine (APRISO) 0.375 g 24 hr capsule Take 750 mg by mouth daily.      methocarbamol (ROBAXIN) 500 MG tablet Take 500 mg by mouth daily as needed (headaches).   1   montelukast (SINGULAIR) 10 MG tablet Take 1 tablet (10 mg total)  by mouth at bedtime. 30 tablet 11   omeprazole (PRILOSEC) 20 MG capsule Take 20 mg by mouth daily as needed (acid reflux).     Respiratory Therapy Supplies (FLUTTER) DEVI Use flutter valve 3 times a day 1 each 0   Zinc 30 MG CAPS Take 30 mg by mouth daily.     zolpidem (AMBIEN) 10 MG tablet Take 10 mg by mouth at bedtime.     levofloxacin (LEVAQUIN) 500 MG tablet Take 1 tablet (500 mg total) by mouth daily. (Patient not taking: Reported on 09/10/2021) 7 tablet 0   naproxen sodium (ALEVE) 220 MG tablet Take 220 mg by mouth 2 (two) times daily as needed (pain). (Patient not taking: Reported on  09/10/2021)     predniSONE (DELTASONE) 10 MG tablet Take 4 tabs po daily x 3 days; then 3 tabs daily x3 days; then 2 tabs daily x3 days; then 1 tab daily x 3 days; then stop (Patient not taking: Reported on 09/10/2021) 30 tablet 0   No current facility-administered medications for this visit.    Allergies  Allergen Reactions   Cymbalta [Duloxetine Hcl]     flushing   Doxycycline Photosensitivity   Remicade [Infliximab]     Flushing   Penicillins Rash    Did it involve swelling of the face/tongue/throat, SOB, or low BP? No Did it involve sudden or severe rash/hives, skin peeling, or any reaction on the inside of your mouth or nose? No Did you need to seek medical attention at a hospital or doctor's office? No When did it last happen?      30 + years If all above answers are "NO", may proceed with cephalosporin use.     Social History   Socioeconomic History   Marital status: Married    Spouse name: Not on file   Number of children: 3   Years of education: Not on file   Highest education level: Not on file  Occupational History   Occupation: Works at [employer]   Smoking status: Former    Packs/day: 1.00    Years: 9.00    Pack years: 9.00    Types: Cigarettes    Quit date: 09/13/1998    Years since quitting: 23.0   Smokeless tobacco: Never  Vaping Use   Vaping Use: Never used  Substance and Sexual Activity   Alcohol use: Yes    Comment: occasional   Drug use: No   Sexual activity: Not on file  Other Topics Concern   Not on file  Social History Narrative   Assistant VP [employer]    Social Determinants of Health   Financial Resource Strain: Not on file  Food Insecurity: Not on file  Transportation Needs: Not on file  Physical Activity: Not on file  Stress: Not on file  Social Connections: Not on file  Intimate Partner Violence: Not on file    Family History  Problem Relation Age of Onset   Leukemia Father    CAD Father     Crohn's disease Brother     Review of Systems:  As stated in the HPI and otherwise negative.   BP 118/64 (BP Location: Left Arm, Patient Position: Sitting, Cuff Size: Normal)    Pulse 68    Ht 5' 6"  (1.676 m)    Wt 130 lb (59 kg)    LMP 02/14/2014 Comment: bcp//A.C.   SpO2 99%    BMI 20.98 kg/m   Physical Examination:  General: Well  developed, well nourished, NAD  HEENT: OP clear, mucus membranes moist  SKIN: warm, dry. No rashes. Neuro: No focal deficits  Musculoskeletal: Muscle strength 5/5 all ext  Psychiatric: Mood and affect normal  Neck: No JVD, no carotid bruits, no thyromegaly, no lymphadenopathy.  Lungs:Clear bilaterally, no wheezes, rhonci, crackles Cardiovascular: Regular rate and rhythm. Soft systolic murmur.  Abdomen:Soft. Bowel sounds present. Non-tender.  Extremities: No lower extremity edema. Pulses are 2 + in the bilateral DP/PT.  EKG:  EKG is ordered today. The EKG is personally reviewed Sinus  Recent Labs: No results found for requested labs within last 8760 hours.   Lipid Panel No results found for: CHOL, TRIG, HDL, CHOLHDL, VLDL, LDLCALC, LDLDIRECT   Wt Readings from Last 3 Encounters:  09/10/21 130 lb (59 kg)  06/01/21 125 lb 12.8 oz (57.1 kg)  05/30/20 123 lb 6.4 oz (56 kg)     Other studies Reviewed: Additional studies/ records that were reviewed today include: . Review of the above records demonstrates:    Assessment and Plan:   1. Chest pain: She has no chest pain that is concerning for angina. Normal stress test in 2018. Normal LVEF by echo in 2018.   2. Cardiac murmur: Soft systolic murmur. Will arrange echo to assess.  Current medicines are reviewed at length with the patient today.  The patient does not have concerns regarding medicines.  The following changes have been made:  no change  Labs/ tests ordered today include:   Orders Placed This Encounter  Procedures   EKG 12-Lead   ECHOCARDIOGRAM COMPLETE   Disposition:   F/U with  me in 24 months   Signed, Lauree Chandler, MD 09/10/2021 3:43 PM    [facility] [address], [place], [place]  [address] Phone: [phone]; Fax: [phone]

## 2021-09-10 ENCOUNTER — Other Ambulatory Visit: Payer: Self-pay

## 2021-09-10 ENCOUNTER — Encounter: Payer: Self-pay | Admitting: Cardiovascular Disease

## 2021-09-10 ENCOUNTER — Ambulatory Visit: Payer: BC Managed Care – PPO | Admitting: Cardiovascular Disease

## 2021-09-10 VITALS — BP 118/64 | HR 68 | Ht 66.0 in | Wt 130.0 lb

## 2021-09-10 DIAGNOSIS — R011 Cardiac murmur, unspecified: Secondary | ICD-10-CM

## 2021-09-10 NOTE — Patient Instructions (Signed)
Medication Instructions:  Your physician recommends that you continue on your current medications as directed. Please refer to the Current Medication list given to you today.  *If you need a refill on your cardiac medications before your next appointment, please call your pharmacy*   Lab Work: None ordered   If you have labs (blood work) drawn today and your tests are completely normal, you will receive your results only by: [place] (if you have MyChart) OR A paper copy in the mail If you have any lab test that is abnormal or we need to change your treatment, we will call you to review the results.   Testing/Procedures: Your physician has requested that you have an echocardiogram. Echocardiography is a painless test that uses sound waves to create images of your heart. It provides your doctor with information about the size and shape of your heart and how well your hearts chambers and valves are working. This procedure takes approximately one hour. There are no restrictions for this procedure.    Follow-Up: At [facility], you and your health needs are our priority.  As part of our continuing mission to provide you with exceptional heart care, we have created designated Provider Care Teams.  These Care Teams include your primary Cardiologist (physician) and Advanced Practice Providers (APPs -  Physician Assistants and Nurse Practitioners) who all work together to provide you with the care you need, when you need it.  We recommend signing up for the patient portal called "MyChart".  Sign up information is provided on this After Visit Summary.  MyChart is used to connect with patients for Virtual Visits (Telemedicine).  Patients are able to view lab/test results, encounter notes, upcoming appointments, etc.  Non-urgent messages can be sent to your provider as well.   To learn more about what you can do with MyChart, go to [email or web address].    Your next appointment:   2  year(s)  The format for your next appointment:   In Person  Provider:   Lauree Chandler, MD     Other Instructions None

## 2021-10-02 ENCOUNTER — Ambulatory Visit (HOSPITAL_COMMUNITY): Payer: BC Managed Care – PPO | Attending: Cardiovascular Disease

## 2021-10-02 ENCOUNTER — Other Ambulatory Visit: Payer: Self-pay

## 2021-10-02 DIAGNOSIS — R011 Cardiac murmur, unspecified: Secondary | ICD-10-CM | POA: Diagnosis not present

## 2021-10-02 LAB — ECHOCARDIOGRAM COMPLETE
AR max vel: 2.42 cm2
AV Area VTI: 2.35 cm2
AV Area mean vel: 2.37 cm2
AV Mean grad: 8 mmHg
AV Peak grad: 14 mmHg
Ao pk vel: 1.87 m/s
Area-P 1/2: 3.85 cm2
S' Lateral: 2.3 cm

## 2021-10-07 ENCOUNTER — Other Ambulatory Visit: Payer: Self-pay | Admitting: *Deleted

## 2021-10-19 DIAGNOSIS — Z13820 Encounter for screening for osteoporosis: Secondary | ICD-10-CM | POA: Diagnosis not present

## 2021-11-09 DIAGNOSIS — K219 Gastro-esophageal reflux disease without esophagitis: Secondary | ICD-10-CM | POA: Diagnosis not present

## 2021-11-09 DIAGNOSIS — K449 Diaphragmatic hernia without obstruction or gangrene: Secondary | ICD-10-CM | POA: Diagnosis not present

## 2021-11-17 DIAGNOSIS — M1811 Unilateral primary osteoarthritis of first carpometacarpal joint, right hand: Secondary | ICD-10-CM | POA: Diagnosis not present

## 2021-11-20 DIAGNOSIS — E7800 Pure hypercholesterolemia, unspecified: Secondary | ICD-10-CM | POA: Diagnosis not present

## 2021-11-20 DIAGNOSIS — R7301 Impaired fasting glucose: Secondary | ICD-10-CM | POA: Diagnosis not present

## 2021-11-20 DIAGNOSIS — Z0000 Encounter for general adult medical examination without abnormal findings: Secondary | ICD-10-CM | POA: Diagnosis not present

## 2021-11-20 DIAGNOSIS — Z79899 Other long term (current) drug therapy: Secondary | ICD-10-CM | POA: Diagnosis not present

## 2021-11-20 DIAGNOSIS — M797 Fibromyalgia: Secondary | ICD-10-CM | POA: Diagnosis not present

## 2021-11-20 DIAGNOSIS — Z23 Encounter for immunization: Secondary | ICD-10-CM | POA: Diagnosis not present

## 2021-11-20 DIAGNOSIS — K219 Gastro-esophageal reflux disease without esophagitis: Secondary | ICD-10-CM | POA: Diagnosis not present

## 2021-12-21 DIAGNOSIS — F5101 Primary insomnia: Secondary | ICD-10-CM | POA: Diagnosis not present

## 2021-12-21 DIAGNOSIS — F411 Generalized anxiety disorder: Secondary | ICD-10-CM | POA: Diagnosis not present

## 2021-12-28 ENCOUNTER — Ambulatory Visit: Payer: BC Managed Care – PPO | Admitting: Cardiovascular Disease

## 2022-02-09 DIAGNOSIS — M1811 Unilateral primary osteoarthritis of first carpometacarpal joint, right hand: Secondary | ICD-10-CM | POA: Diagnosis not present

## 2022-05-13 DIAGNOSIS — M1811 Unilateral primary osteoarthritis of first carpometacarpal joint, right hand: Secondary | ICD-10-CM | POA: Diagnosis not present

## 2022-05-28 DIAGNOSIS — R7301 Impaired fasting glucose: Secondary | ICD-10-CM | POA: Diagnosis not present

## 2022-06-09 DIAGNOSIS — M1811 Unilateral primary osteoarthritis of first carpometacarpal joint, right hand: Secondary | ICD-10-CM | POA: Diagnosis not present

## 2022-06-09 DIAGNOSIS — M654 Radial styloid tenosynovitis [de Quervain]: Secondary | ICD-10-CM | POA: Diagnosis not present

## 2022-06-17 DIAGNOSIS — M1811 Unilateral primary osteoarthritis of first carpometacarpal joint, right hand: Secondary | ICD-10-CM | POA: Diagnosis not present

## 2022-06-17 DIAGNOSIS — R52 Pain, unspecified: Secondary | ICD-10-CM | POA: Diagnosis not present

## 2022-07-14 DIAGNOSIS — M1811 Unilateral primary osteoarthritis of first carpometacarpal joint, right hand: Secondary | ICD-10-CM | POA: Diagnosis not present

## 2022-07-14 DIAGNOSIS — M25631 Stiffness of right wrist, not elsewhere classified: Secondary | ICD-10-CM | POA: Diagnosis not present

## 2022-07-14 DIAGNOSIS — M25641 Stiffness of right hand, not elsewhere classified: Secondary | ICD-10-CM | POA: Diagnosis not present

## 2022-07-14 DIAGNOSIS — R52 Pain, unspecified: Secondary | ICD-10-CM | POA: Diagnosis not present

## 2022-08-03 DIAGNOSIS — I83893 Varicose veins of bilateral lower extremities with other complications: Secondary | ICD-10-CM | POA: Diagnosis not present

## 2022-08-17 DIAGNOSIS — M25631 Stiffness of right wrist, not elsewhere classified: Secondary | ICD-10-CM | POA: Diagnosis not present

## 2022-08-17 DIAGNOSIS — M25641 Stiffness of right hand, not elsewhere classified: Secondary | ICD-10-CM | POA: Diagnosis not present

## 2022-08-17 DIAGNOSIS — M1811 Unilateral primary osteoarthritis of first carpometacarpal joint, right hand: Secondary | ICD-10-CM | POA: Diagnosis not present

## 2022-08-17 DIAGNOSIS — R52 Pain, unspecified: Secondary | ICD-10-CM | POA: Diagnosis not present

## 2022-10-12 DIAGNOSIS — Z1231 Encounter for screening mammogram for malignant neoplasm of breast: Secondary | ICD-10-CM | POA: Diagnosis not present

## 2022-10-12 DIAGNOSIS — Z801 Family history of malignant neoplasm of trachea, bronchus and lung: Secondary | ICD-10-CM | POA: Diagnosis not present

## 2022-10-12 DIAGNOSIS — Z85820 Personal history of malignant melanoma of skin: Secondary | ICD-10-CM | POA: Diagnosis not present

## 2022-10-12 DIAGNOSIS — Z6820 Body mass index (BMI) 20.0-20.9, adult: Secondary | ICD-10-CM | POA: Diagnosis not present

## 2022-10-12 DIAGNOSIS — Z803 Family history of malignant neoplasm of breast: Secondary | ICD-10-CM | POA: Diagnosis not present

## 2022-10-12 DIAGNOSIS — Z806 Family history of leukemia: Secondary | ICD-10-CM | POA: Diagnosis not present

## 2022-10-12 DIAGNOSIS — Z01419 Encounter for gynecological examination (general) (routine) without abnormal findings: Secondary | ICD-10-CM | POA: Diagnosis not present

## 2022-10-15 ENCOUNTER — Other Ambulatory Visit: Payer: Self-pay | Admitting: Obstetrics and Gynecology

## 2022-10-15 DIAGNOSIS — Z8249 Family history of ischemic heart disease and other diseases of the circulatory system: Secondary | ICD-10-CM

## 2022-11-01 DIAGNOSIS — S8011XA Contusion of right lower leg, initial encounter: Secondary | ICD-10-CM | POA: Diagnosis not present

## 2022-11-04 DIAGNOSIS — S8991XA Unspecified injury of right lower leg, initial encounter: Secondary | ICD-10-CM | POA: Diagnosis not present

## 2022-11-22 DIAGNOSIS — E7800 Pure hypercholesterolemia, unspecified: Secondary | ICD-10-CM | POA: Diagnosis not present

## 2022-11-22 DIAGNOSIS — Z0000 Encounter for general adult medical examination without abnormal findings: Secondary | ICD-10-CM | POA: Diagnosis not present

## 2022-11-22 DIAGNOSIS — Z79899 Other long term (current) drug therapy: Secondary | ICD-10-CM | POA: Diagnosis not present

## 2022-11-24 ENCOUNTER — Other Ambulatory Visit: Payer: Self-pay | Admitting: Primary Care

## 2022-12-07 ENCOUNTER — Telehealth: Payer: Self-pay | Admitting: *Deleted

## 2022-12-07 ENCOUNTER — Encounter: Payer: Self-pay | Admitting: Adult Health

## 2022-12-07 ENCOUNTER — Ambulatory Visit: Payer: BC Managed Care – PPO | Admitting: Adult Health

## 2022-12-07 VITALS — BP 118/80 | HR 76 | Temp 97.6°F | Ht 65.5 in | Wt 123.4 lb

## 2022-12-07 DIAGNOSIS — J479 Bronchiectasis, uncomplicated: Secondary | ICD-10-CM | POA: Diagnosis not present

## 2022-12-07 DIAGNOSIS — J4520 Mild intermittent asthma, uncomplicated: Secondary | ICD-10-CM | POA: Diagnosis not present

## 2022-12-07 DIAGNOSIS — J45909 Unspecified asthma, uncomplicated: Secondary | ICD-10-CM | POA: Insufficient documentation

## 2022-12-07 DIAGNOSIS — R053 Chronic cough: Secondary | ICD-10-CM | POA: Diagnosis not present

## 2022-12-07 DIAGNOSIS — R911 Solitary pulmonary nodule: Secondary | ICD-10-CM

## 2022-12-07 MED ORDER — BUDESONIDE-FORMOTEROL FUMARATE 80-4.5 MCG/ACT IN AERO: INHALATION_SPRAY | RESPIRATORY_TRACT | 4 refills | Status: DC

## 2022-12-07 MED ORDER — ALBUTEROL SULFATE HFA 108 (90 BASE) MCG/ACT IN AERS: 2.0000 | INHALATION_SPRAY | Freq: Four times a day (QID) | RESPIRATORY_TRACT | 6 refills | Status: AC | PRN

## 2022-12-07 NOTE — Patient Instructions (Addendum)
Restart Symbicort 80 2 puffs twice daily, rinse after use Albuterol inhaler As needed   Continue on Zyrtec , Nasacort and Singulair .  Activity as tolerated.  Mucinex DM twice daily as needed for cough and congestion Use flutter valve 2 times As needed   Continue on Omeprazole 20mg  daily As needed   GERD diet  Follow up with Dr. Chase Caller in 1 year with PFT and As needed   Please contact office for sooner follow up if symptoms do not improve or worsen or seek emergency care

## 2022-12-07 NOTE — Addendum Note (Signed)
Addended by: Vanessa Barbara on: 12/07/2022 10:50 AM   Modules accepted: Orders

## 2022-12-07 NOTE — Assessment & Plan Note (Signed)
Mild intermittent asthma with an allergic phenotype.  Continue with trigger prevention.  Continue on allergic rhinitis treatment.  Patient has an asthma action plan with using Symbicort during the seasonal time and flare of symptoms. Check PFTs on return visit.  Plan  Patient Instructions  Restart Symbicort 80 2 puffs twice daily, rinse after use Albuterol inhaler As needed   Continue on Zyrtec , Nasacort and Singulair .  Activity as tolerated.  Mucinex DM twice daily as needed for cough and congestion Use flutter valve 2 times As needed   Continue on Omeprazole 20mg  daily As needed   GERD diet  Follow up with Dr. Chase Caller in 1 year with PFT and As needed   Please contact office for sooner follow up if symptoms do not improve or worsen or seek emergency care

## 2022-12-07 NOTE — Progress Notes (Signed)
@Patient  ID: Jessica Bates, female    DOB: Dec 17, 1960, 62 y.o.   MRN: [id number]  Chief Complaint  Patient presents with   Follow-up    Referring provider: Glenis Smoker, *  HPI: 62 year old female former smoker followed for mild bronchiectasis, chronic cough, pulmonary nodule, mild intermittent asthma and allergic rhinitis Medical history significant for Crohn's disease and Raynaud's syndrome  TEST/EVENTS :  PFTs>> 06/14/2017>> mild obstruction>> slightly decreased DLCO   FENO>> 06/13/2017>>18 ppb   CXR>> 06/13/2017>> No active cardiopulmonary disease.    CT Chest 05/17/2017>Continued improvement in post inflammatory scarring in the RIGHT middle lobe and lingula. Stable small pulmonary nodule.   CT chest October 2019 showed stable 4 mm right middle lobe nodule.  Similar appearance of mild right middle lobe and lingular bronchiectasis and small airway impaction.  CT chest November 2020 showed a stable 4 mm right upper lobe nodule consistent with a benign etiology.  Mild bilateral pleural parenchymal scarring.  12/07/2022 Follow up: Bronchiectasis, Cough, Asthma and Allergic Rhinitis, pulmonary nodule Patient presents for a follow-up visit, last seen September 2022.  Patient was previously noted to have a small pulmonary nodule in the right middle to upper lobe.  Serial CT scans showed stability from 2018-2020 consistent with a benign etiology.  Previously patient had mild right middle lobe and lingular bronchiectasis noted on CT scan in 2018 2019.  In 2020 CT scan showed some mild bilateral pleural-parenchymal scarring.  Previous PFTs showed minimal airflow obstruction and a slightly decreased diffusing capacity.  Patient had previously been treated for some mild intermittent asthma.  With Symbicort.  Patient says since last visit she has been doing very well.  She typically will use her Symbicort during the springtime due to seasonal allergies.  She has had no flareup of cough or  congestion over the last couple years.  No antibiotic use.  Patient says she is very active.  Walks every day.  Has no significant shortness of breath.  She takes Zyrtec, Nasacort and Singulair every day.  This seems to control her allergies well.  She does need a refill of her Symbicort and albuterol.  She has very minimal use of albuterol.   Allergies  Allergen Reactions   Cymbalta [Duloxetine Hcl]     flushing   Doxycycline Photosensitivity   Remicade [Infliximab]     Flushing   Penicillins Rash    Did it involve swelling of the face/tongue/throat, SOB, or low BP? No Did it involve sudden or severe rash/hives, skin peeling, or any reaction on the inside of your mouth or nose? No Did you need to seek medical attention at a hospital or doctor's office? No When did it last happen?      30 + years If all above answers are "NO", may proceed with cephalosporin use.     Immunization History  Administered Date(s) Administered   Influenza Split 06/13/2018, 06/15/2019   Influenza Whole 07/28/2020   Influenza,inj,Quad PF,6+ Mos 06/14/2015, 06/13/2016, 06/15/2019, 06/01/2021, 08/10/2022   Influenza-Unspecified 06/12/2017   Janssen (J&J) SARS-COV-2 Vaccination 11/23/2019   PFIZER(Purple Top)SARS-COV-2 Vaccination 06/15/2020, 07/06/2020, 10/25/2020   Pneumococcal Conjugate-13 12/31/2016   Pneumococcal Polysaccharide-23 12/31/2016   Tdap 06/19/2008, 06/15/2013    Past Medical History:  Diagnosis Date   Asthma    bronchiectesis   Crohn disease ([place])    Depression meopause   Dyslipidemia    Fibromyalgia    GERD (gastroesophageal reflux disease)    Hemorrhoids    Hiatal hernia    Melanoma (  [place])    Pulmonary nodules     Tobacco History: Social History   Tobacco Use  Smoking Status Former   Packs/day: 1.00   Years: 9.00   Additional pack years: 0.00   Total pack years: 9.00   Types: Cigarettes   Quit date: 09/13/1998   Years since quitting: 24.2  Smokeless Tobacco Never    Counseling given: Not Answered   Outpatient Medications Prior to Visit  Medication Sig Dispense Refill   acyclovir (ZOVIRAX) 400 MG tablet Take 400 mg by mouth daily.     ALPRAZolam (XANAX) 0.25 MG tablet Take 0.125 mg by mouth daily as needed for anxiety.      aspirin-acetaminophen-caffeine (EXCEDRIN MIGRAINE) 250-250-65 MG tablet Take 2 tablets by mouth every 6 (six) hours as needed for headache.     atorvastatin (LIPITOR) 40 MG tablet Take 40 mg by mouth at bedtime.      cetirizine (ZYRTEC) 10 MG tablet Take 10 mg by mouth daily.     cholecalciferol (VITAMIN D3) 25 MCG (1000 UT) tablet Take 1,000 Units by mouth daily.     estradiol (VIVELLE-DOT) 0.05 MG/24HR patch Place 1 patch onto the skin 2 (two) times a week.     LINZESS 145 MCG CAPS capsule Take 145 mcg by mouth daily.     mesalamine (APRISO) 0.375 g 24 hr capsule Take 750 mg by mouth daily.      methocarbamol (ROBAXIN) 500 MG tablet Take 500 mg by mouth daily as needed (headaches).   1   montelukast (SINGULAIR) 10 MG tablet Take 1 tablet (10 mg total) by mouth at bedtime. 30 tablet 11   omeprazole (PRILOSEC) 20 MG capsule Take 20 mg by mouth daily as needed (acid reflux).     Respiratory Therapy Supplies (FLUTTER) DEVI Use flutter valve 3 times a day 1 each 0   Zinc 30 MG CAPS Take 30 mg by mouth daily as needed.     zolpidem (AMBIEN) 10 MG tablet Take 10 mg by mouth at bedtime.     albuterol (VENTOLIN HFA) 108 (90 Base) MCG/ACT inhaler      budesonide-formoterol (SYMBICORT) 80-4.5 MCG/ACT inhaler INHALE 2 PUFFS BY MOUTH TWICE DAILY 10.2 g 6   conjugated estrogens (PREMARIN) vaginal cream Place 1 Applicatorful vaginally daily. (Patient not taking: Reported on 12/07/2022)     levofloxacin (LEVAQUIN) 500 MG tablet Take 1 tablet (500 mg total) by mouth daily. (Patient not taking: Reported on 09/10/2021) 7 tablet 0   naproxen sodium (ALEVE) 220 MG tablet Take 220 mg by mouth 2 (two) times daily as needed (pain). (Patient not taking:  Reported on 09/10/2021)     predniSONE (DELTASONE) 10 MG tablet Take 4 tabs po daily x 3 days; then 3 tabs daily x3 days; then 2 tabs daily x3 days; then 1 tab daily x 3 days; then stop (Patient not taking: Reported on 09/10/2021) 30 tablet 0   No facility-administered medications prior to visit.     Review of Systems:   Constitutional:   No  weight loss, night sweats,  Fevers, chills, fatigue, or  lassitude.  HEENT:   No headaches,  Difficulty swallowing,  Tooth/dental problems, or  Sore throat,                No sneezing, itching, ear ache,  +nasal congestion, post nasal drip,   CV:  No chest pain,  Orthopnea, PND, swelling in lower extremities, anasarca, dizziness, palpitations, syncope.   GI  No heartburn, indigestion, abdominal pain, nausea,  vomiting, diarrhea, change in bowel habits, loss of appetite, bloody stools.   Resp: No shortness of breath with exertion or at rest.  No excess mucus, no productive cough,  No non-productive cough,  No coughing up of blood.  No change in color of mucus.  No wheezing.  No chest wall deformity  Skin: no rash or lesions.  GU: no dysuria, change in color of urine, no urgency or frequency.  No flank pain, no hematuria   MS:  No joint pain or swelling.  No decreased range of motion.  No back pain.    Physical Exam  BP 118/80 (BP Location: Left Arm, Patient Position: Sitting, Cuff Size: Normal)   Pulse 76   Temp 97.6 F (36.4 C) (Oral)   Ht 5' 5.5" (1.664 m)   Wt 123 lb 6.4 oz (56 kg)   LMP 02/14/2014 Comment: bcp//A.C.  SpO2 98%   BMI 20.22 kg/m   GEN: A/Ox3; pleasant , NAD, well nourished    HEENT:  [place]/AT,   NOSE-clear, THROAT-clear, no lesions, no postnasal drip or exudate noted.   NECK:  Supple w/ fair ROM; no JVD; normal carotid impulses w/o bruits; no thyromegaly or nodules palpated; no lymphadenopathy.    RESP  Clear  P & A; w/o, wheezes/ rales/ or rhonchi. no accessory muscle use, no dullness to percussion  CARD:  RRR, no  m/r/g, no peripheral edema, pulses intact, no cyanosis or clubbing.  GI:   Soft & nt; nml bowel sounds; no organomegaly or masses detected.   Musco: Warm bil, no deformities or joint swelling noted.   Neuro: alert, no focal deficits noted.    Skin: Warm, no lesions or rashes    Lab Results:  CBC     BNP No results found for: "BNP"  ProBNP No results found for: "PROBNP"  Imaging: No results found.       Latest Ref Rng & Units 06/14/2017    4:03 PM  PFT Results  FVC-Pre L 3.15   FVC-Predicted Pre % 88   FVC-Post L 3.14   FVC-Predicted Post % 88   Pre FEV1/FVC % % 72   Post FEV1/FCV % % 77   FEV1-Pre L 2.27   FEV1-Predicted Pre % 82   FEV1-Post L 2.43   DLCO uncorrected ml/min/mmHg 16.68   DLCO UNC% % 65   DLCO corrected ml/min/mmHg 16.11   DLCO COR %Predicted % 62   DLVA Predicted % 73   TLC L 5.02   TLC % Predicted % 96   RV % Predicted % 103     Lab Results  Component Value Date   NITRICOXIDE 18 06/13/2017        Assessment & Plan:   Asthma Mild intermittent asthma with an allergic phenotype.  Continue with trigger prevention.  Continue on allergic rhinitis treatment.  Patient has an asthma action plan with using Symbicort during the seasonal time and flare of symptoms. Check PFTs on return visit.  Plan  Patient Instructions  Restart Symbicort 80 2 puffs twice daily, rinse after use Albuterol inhaler As needed   Continue on Zyrtec , Nasacort and Singulair .  Activity as tolerated.  Mucinex DM twice daily as needed for cough and congestion Use flutter valve 2 times As needed   Continue on Omeprazole 20mg  daily As needed   GERD diet  Follow up with Dr. Chase Caller in 1 year with PFT and As needed   Please contact office for sooner follow up if symptoms do  not improve or worsen or seek emergency care        Bronchiectasis without complication ([place]) Very minimum bronchiectasis noted on previous CT scans.  CT scan in 2020 showed only mild  bilateral pleural-parenchymal scarring.  Previous PFTs with minimal obstruction.  Will check PFTs on return visit.  Patient does have an upcoming coronary CT chest, consider chest on return visit if indicated.   Plan  Patient Instructions  Restart Symbicort 80 2 puffs twice daily, rinse after use Albuterol inhaler As needed   Continue on Zyrtec , Nasacort and Singulair .  Activity as tolerated.  Mucinex DM twice daily as needed for cough and congestion Use flutter valve 2 times As needed   Continue on Omeprazole 20mg  daily As needed   GERD diet  Follow up with Dr. Chase Caller in 1 year with PFT and As needed   Please contact office for sooner follow up if symptoms do not improve or worsen or seek emergency care       Pulmonary nodule, right Stable right-sided pulmonary nodule on serial CT.  Consistent with benign etiology.  Chronic cough Resolved     Rexene Edison, NP 12/07/2022

## 2022-12-07 NOTE — Assessment & Plan Note (Signed)
Very minimum bronchiectasis noted on previous CT scans.  CT scan in 2020 showed only mild bilateral pleural-parenchymal scarring.  Previous PFTs with minimal obstruction.  Will check PFTs on return visit.  Patient does have an upcoming coronary CT chest, consider chest on return visit if indicated.   Plan  Patient Instructions  Restart Symbicort 80 2 puffs twice daily, rinse after use Albuterol inhaler As needed   Continue on Zyrtec , Nasacort and Singulair .  Activity as tolerated.  Mucinex DM twice daily as needed for cough and congestion Use flutter valve 2 times As needed   Continue on Omeprazole 20mg  daily As needed   GERD diet  Follow up with Dr. Chase Caller in 1 year with PFT and As needed   Please contact office for sooner follow up if symptoms do not improve or worsen or seek emergency care

## 2022-12-07 NOTE — Telephone Encounter (Signed)
Called and spoke with patient, let her know that Tammy wants her to have a 60 minute PFT in one year with 1 year with f/u with Dr. Chase Caller.  She verbalized understanding.  Nothing further needed.

## 2022-12-07 NOTE — Assessment & Plan Note (Signed)
Stable right-sided pulmonary nodule on serial CT.  Consistent with benign etiology.

## 2022-12-07 NOTE — Assessment & Plan Note (Signed)
Resolved

## 2022-12-10 ENCOUNTER — Other Ambulatory Visit: Payer: BC Managed Care – PPO

## 2022-12-10 ENCOUNTER — Other Ambulatory Visit (HOSPITAL_COMMUNITY): Payer: Self-pay

## 2022-12-13 ENCOUNTER — Ambulatory Visit
Admission: RE | Admit: 2022-12-13 | Discharge: 2022-12-13 | Disposition: A | Discharge status: Home / Self Care | Payer: No Typology Code available for payment source | Source: Ambulatory Visit | Attending: Obstetrics and Gynecology | Admitting: Obstetrics and Gynecology

## 2022-12-13 DIAGNOSIS — Z8249 Family history of ischemic heart disease and other diseases of the circulatory system: Secondary | ICD-10-CM

## 2022-12-15 ENCOUNTER — Telehealth: Payer: Self-pay | Admitting: Adult Health

## 2022-12-15 DIAGNOSIS — L821 Other seborrheic keratosis: Secondary | ICD-10-CM | POA: Diagnosis not present

## 2022-12-15 DIAGNOSIS — J189 Pneumonia, unspecified organism: Secondary | ICD-10-CM

## 2022-12-15 DIAGNOSIS — Z85820 Personal history of malignant melanoma of skin: Secondary | ICD-10-CM | POA: Diagnosis not present

## 2022-12-15 DIAGNOSIS — D2271 Melanocytic nevi of right lower limb, including hip: Secondary | ICD-10-CM | POA: Diagnosis not present

## 2022-12-15 DIAGNOSIS — D225 Melanocytic nevi of trunk: Secondary | ICD-10-CM | POA: Diagnosis not present

## 2022-12-16 MED ORDER — LEVOFLOXACIN 500 MG PO TABS: 500.0000 mg | ORAL_TABLET | Freq: Every day | ORAL | 0 refills | Status: AC

## 2022-12-16 NOTE — Telephone Encounter (Signed)
Pt called back. Scheduled f/u with TP in 6 weeks and also placed order for CT. Nothing further needed.

## 2022-12-16 NOTE — Telephone Encounter (Signed)
Called and spoke with patient. Patient stated that she wanted TP to take a look at her results on the calcium scoring to see if everything looked okay or see if there was something new that has popped up.   TP, please advise.

## 2022-12-16 NOTE — Telephone Encounter (Signed)
Coronary CT chest shows new bilateral consolidations right middle lobe and lingula/left lower lobe.  Patient has had increased asthma symptoms and sinus drainage.  Will treat for possible bilateral pneumonia.  Begin Levaquin 500 mg daily for 7 days.  Patient is allergic to penicillin and doxycycline. Encouraged to use her flutter valve at least twice daily.  And she restart Symbicort twice daily. Please set up follow-up visit in 4 weeks-after CT chest  and as needed-Dr. Chase Caller or Adean Milosevic NP   Please set up CT chest without contrast follow-up pneumonia 4 weeks  Patient is aware  Rx sent to pharm   Please contact office for sooner follow up if symptoms do not improve or worsen or seek emergency care

## 2022-12-16 NOTE — Telephone Encounter (Signed)
Attempted to call pt but unable to reach. Left message to return call.  

## 2022-12-18 ENCOUNTER — Telehealth: Payer: Self-pay | Admitting: Internal Medicine

## 2022-12-18 DIAGNOSIS — R319 Hematuria, unspecified: Secondary | ICD-10-CM | POA: Diagnosis not present

## 2022-12-18 NOTE — Telephone Encounter (Signed)
Shary Key -called through the answering system on Saturday/6/24.  She states that December 13, 2022 she had a CT coronary calcium and this was then interpreted by Rikki Spearing on 12/16/2022 it showed some infiltrates which I personally visualized and confirmed.  Having better than his prescribed Levaquin patient is halfway through.  She was essentially asymptomatic before the Levaquin and she continues to be that from a respiratory standpoint but today she feels she has had some hematuria.  We went over the color of the urine.  She says it was dark when she flushes that it looks pink.  There is no dysuria there is no fever there is no chills there is no back tenderness.  -Review of up-to-date indicates it is unlikely that Levaquin is causing the discolored urine unless she has hepatotoxicity which she does not have any jaundice and the urine is not dark but it is actually pink when she flushes -No other UTI symptoms  Plan for pulmonary triage -Advised her to go get the urine tested through primary care otherwise Monday, 12/20/2022 we can get this tested in our office along with urine analysis, urine culture and liver function test

## 2022-12-23 NOTE — Progress Notes (Signed)
Office Visit    Patient Name: RAIZEL WESOLOWSKI Date of Encounter: 12/23/2022  Primary Care Provider:  Shon Hale, MD Primary Cardiologist:  Verne Carrow, MD Primary Electrophysiologist: None  Chief Complaint    LESSIE FUNDERBURKE is a 62 y.o. female with PMH of nonobstructive CAD HLD, fibromyalgia, asthma, chest pain, GERD, hiatal hernia, tobacco abuse who presents today for follow-up of recent coronary calcium score.  Past Medical History    Past Medical History:  Diagnosis Date   Asthma    bronchiectesis   Crohn disease (HCC)    Depression meopause   Dyslipidemia    Fibromyalgia    GERD (gastroesophageal reflux disease)    Hemorrhoids    Hiatal hernia    Melanoma (HCC)    Pulmonary nodules    Past Surgical History:  Procedure Laterality Date   ANTERIOR AND POSTERIOR REPAIR WITH SACROSPINOUS FIXATION N/A 08/27/2019   Procedure: ANTERIOR AND POSTERIOR REPAIR WITH SACROSPINOUS FIXATION;  Surgeon: Richardean Chimera, MD;  Location: [facility] [place];  Service: Gynecology;  Laterality: N/A;   colonscopy     LAPAROSCOPIC VAGINAL HYSTERECTOMY WITH SALPINGO OOPHORECTOMY Bilateral 08/27/2019   Procedure: LAPAROSCOPIC ASSISTED VAGINAL HYSTERECTOMY WITH SALPINGO OOPHORECTOMY;  Surgeon: Richardean Chimera, MD;  Location: [facility];  Service: Gynecology;  Laterality: Bilateral;  NEED BED   MELANOMA EXCISION      Allergies  Allergies  Allergen Reactions   Cymbalta [Duloxetine Hcl]     flushing   Doxycycline Photosensitivity   Remicade [Infliximab]     Flushing   Penicillins Rash    Did it involve swelling of the face/tongue/throat, SOB, or low BP? No Did it involve sudden or severe rash/hives, skin peeling, or any reaction on the inside of your mouth or nose? No Did you need to seek medical attention at a hospital or doctor's office? No When did it last happen?      30 + years If all above answers are "NO", may proceed with cephalosporin  use.     History of Present Illness    YETZALI WELD  is a 62 year old female with the above mention past medical history who presents today for follow-up of recent coronary calcium scoring.  Ms. Mckesson was seen initially by Dr. Clifton James in 2018 for a new patient consult and evaluation of chest pain.  She reported chest pain that she describes as pressure at rest.  She denies any exertional chest pain.  2D echo was completed showing EF of 60 to 65% with no significant valvular abnormalities.  She underwent a ETT that was negative for ischemia.  She was seen Follow-up in 02/2020 by Dr. Clifton James for complaint of chest pain.  She was a former smoker for 15 years and stopped in 2000.  She was last seen by Dr. Clifton James in 08/2021 and reported no concerns of chest pain or angina.  She was found to have a soft systolic murmur and 2D echo was arranged for further evaluation.  TTE showed EF of 60 to 65% with no RWMA and trivial MVR with no evidence of mitral valve stenosis.  Since last being seen in the office patient reports she has been having some chest discomfort on her left lateral side.  She denies any palpitations or dizziness with discomfort.  Her blood pressure today is well-controlled at 118/78 and heart rate was 64 bpm.  She recently completed a calcium score and during today's visit we discussed the indications of the findings.  We also discussed the importance of primary prevention lifestyle modification for manage disease.  She has a strong family history of coronary artery disease and is concerned regarding her results. Patient denies chest pain, palpitations, dyspnea, PND, orthopnea, nausea, vomiting, dizziness, syncope, edema, weight gain, or early satiety.  Home Medications    Current Outpatient Medications  Medication Sig Dispense Refill   acyclovir (ZOVIRAX) 400 MG tablet Take 400 mg by mouth daily.     albuterol (VENTOLIN HFA) 108 (90 Base) MCG/ACT inhaler Inhale 2 puffs into the lungs  every 6 (six) hours as needed for wheezing or shortness of breath. 18 g 6   ALPRAZolam (XANAX) 0.25 MG tablet Take 0.125 mg by mouth daily as needed for anxiety.      aspirin-acetaminophen-caffeine (EXCEDRIN MIGRAINE) 250-250-65 MG tablet Take 2 tablets by mouth every 6 (six) hours as needed for headache.     atorvastatin (LIPITOR) 40 MG tablet Take 40 mg by mouth at bedtime.      budesonide-formoterol (SYMBICORT) 80-4.5 MCG/ACT inhaler INHALE 2 PUFFS BY MOUTH TWICE DAILY 3 each 4   cetirizine (ZYRTEC) 10 MG tablet Take 10 mg by mouth daily.     cholecalciferol (VITAMIN D3) 25 MCG (1000 UT) tablet Take 1,000 Units by mouth daily.     estradiol (VIVELLE-DOT) 0.05 MG/24HR patch Place 1 patch onto the skin 2 (two) times a week.     levofloxacin (LEVAQUIN) 500 MG tablet Take 1 tablet (500 mg total) by mouth daily for 7 days. 7 tablet 0   LINZESS 145 MCG CAPS capsule Take 145 mcg by mouth daily.     mesalamine (APRISO) 0.375 g 24 hr capsule Take 750 mg by mouth daily.      methocarbamol (ROBAXIN) 500 MG tablet Take 500 mg by mouth daily as needed (headaches).   1   montelukast (SINGULAIR) 10 MG tablet Take 1 tablet (10 mg total) by mouth at bedtime. 30 tablet 11   omeprazole (PRILOSEC) 20 MG capsule Take 20 mg by mouth daily as needed (acid reflux).     Respiratory Therapy Supplies (FLUTTER) DEVI Use flutter valve 3 times a day 1 each 0   Zinc 30 MG CAPS Take 30 mg by mouth daily as needed.     zolpidem (AMBIEN) 10 MG tablet Take 10 mg by mouth at bedtime.     No current facility-administered medications for this visit.     Review of Systems  Please see the history of present illness.    (+) Anxiety (+) Left lateral side pain  All other systems reviewed and are otherwise negative except as noted above.  Physical Exam    Wt Readings from Last 3 Encounters:  12/07/22 123 lb 6.4 oz (56 kg)  09/10/21 130 lb (59 kg)  06/01/21 125 lb 12.8 oz (57.1 kg)   ZO:XWRUEVS:There were no vitals filed for this  visit.,There is no height or weight on file to calculate BMI.  Constitutional:      Appearance: Healthy appearance. Not in distress.  Neck:     Vascular: JVD normal.  Pulmonary:     Effort: Pulmonary effort is normal.     Breath sounds: No wheezing. No rales. Diminished in the bases Cardiovascular:     Normal rate. Regular rhythm. Normal S1. Normal S2.      Murmurs: There is no murmur.  Edema:    Peripheral edema absent.  Abdominal:     Palpations: Abdomen is soft non tender. There is no hepatomegaly.  Skin:  General: Skin is warm and dry.  Neurological:     General: No focal deficit present.     Mental Status: Alert and oriented to person, place and time.     Cranial Nerves: Cranial nerves are intact.  EKG/LABS/ Recent Cardiac Studies    ECG personally reviewed by me today -sinus rhythm with a rate of 65 bpm and no acute changes consistent with previous EKG.     Lab Results  Component Value Date   WBC 11.6 (H) 08/28/2019   HGB 10.7 (L) 08/28/2019   HCT 34.5 (L) 08/28/2019   MCV 103.0 (H) 08/28/2019   PLT 257 08/28/2019   Lab Results  Component Value Date   CREATININE 0.55 08/23/2019   BUN 12 08/23/2019   NA 139 08/23/2019   K 4.0 08/23/2019   CL 105 08/23/2019   CO2 27 08/23/2019   No results found for: "ALT", "AST", "GGT", "ALKPHOS", "BILITOT" No results found for: "CHOL", "HDL", "LDLCALC", "LDLDIRECT", "TRIG", "CHOLHDL"  No results found for: "HGBA1C"  Cardiac Studies & Procedures     STRESS TESTS  EXERCISE TOLERANCE TEST (ETT) 07/28/2017  Narrative  Pt walked on a Bruce protocol GXT for 10:00. She had no ST or T wave changes to suggest ischemia. Her blood pressure response to exercise was normal.  Negative treadmill test.   ECHOCARDIOGRAM  ECHOCARDIOGRAM COMPLETE 10/02/2021  Narrative ECHOCARDIOGRAM REPORT    Patient Name:   XIANA CARNS Date of Exam: 10/02/2021 Medical Rec #:  [id number]      Height:       66.0 in Accession #:    [id number]      Weight:       130.0 lb Date of Birth:  10-03-1960      BSA:          1.665 m Patient Age:    62 years       BP:           118/64 mmHg Patient Gender: F              HR:           73 bpm. Exam Location:  [address]  Procedure: 2D Echo, 3D Echo, Cardiac Doppler, Strain Analysis and Color Doppler  Indications:    R01.1 Murmur  History:        Patient has prior history of Echocardiogram examinations, most recent 07/27/2017. Risk Factors:Dyslipidemia and Former Smoker.  Sonographer:    Jorje Guild BS, RDCS Referring Phys: [id number] CHRISTOPHER D MCALHANY  IMPRESSIONS   1. Left ventricular ejection fraction, by estimation, is 60 to 65%. The left ventricle has normal function. The left ventricle has no regional wall motion abnormalities. Left ventricular diastolic parameters were normal. The average left ventricular global longitudinal strain is -26.4 %. The global longitudinal strain is normal. 2. Right ventricular systolic function is normal. The right ventricular size is normal. 3. Redundant chords to the anterior leaflet . The mitral valve is abnormal. Trivial mitral valve regurgitation. No evidence of mitral stenosis. 4. AV not well seen cannot assess number of leaflet small gradient across valve. The aortic valve was not well visualized. Aortic valve regurgitation is not visualized. No aortic stenosis is present. 5. The inferior vena cava is normal in size with greater than 50% respiratory variability, suggesting right atrial pressure of 3 mmHg.  Comparison(s): 07/27/17 EF 60-65%.  FINDINGS Left Ventricle: Left ventricular ejection fraction, by estimation, is 60 to 65%. The left ventricle has normal function. The left  ventricle has no regional wall motion abnormalities. The average left ventricular global longitudinal strain is -26.4 %. The global longitudinal strain is normal. The left ventricular internal cavity size was normal in size. There is no left ventricular hypertrophy. Left  ventricular diastolic parameters were normal.  Right Ventricle: The right ventricular size is normal. No increase in right ventricular wall thickness. Right ventricular systolic function is normal.  Left Atrium: Left atrial size was normal in size.  Right Atrium: Right atrial size was normal in size.  Pericardium: There is no evidence of pericardial effusion.  Mitral Valve: Redundant chords to the anterior leaflet. The mitral valve is abnormal. There is mild thickening of the mitral valve leaflet(s). Trivial mitral valve regurgitation. No evidence of mitral valve stenosis.  Tricuspid Valve: The tricuspid valve is normal in structure. Tricuspid valve regurgitation is not demonstrated. No evidence of tricuspid stenosis.  Aortic Valve: AV not well seen cannot assess number of leaflet small gradient across valve. The aortic valve was not well visualized. Aortic valve regurgitation is not visualized. No aortic stenosis is present. Aortic valve mean gradient measures 8.0 mmHg. Aortic valve peak gradient measures 14.0 mmHg. Aortic valve area, by VTI measures 2.35 cm.  Pulmonic Valve: The pulmonic valve was normal in structure. Pulmonic valve regurgitation is not visualized. No evidence of pulmonic stenosis.  Aorta: The aortic root is normal in size and structure.  Venous: The inferior vena cava is normal in size with greater than 50% respiratory variability, suggesting right atrial pressure of 3 mmHg.  IAS/Shunts: No atrial level shunt detected by color flow Doppler.   LEFT VENTRICLE PLAX 2D LVIDd:         3.60 cm   Diastology LVIDs:         2.30 cm   LV e' medial:    8.51 cm/s LV PW:         0.60 cm   LV E/e' medial:  10.5 LV IVS:        0.80 cm   LV e' lateral:   13.50 cm/s LVOT diam:     2.40 cm   LV E/e' lateral: 6.6 LV SV:         92 LV SV Index:   55        2D Longitudinal Strain LVOT Area:     4.52 cm  2D Strain GLS (A2C):   -24.7 % 2D Strain GLS (A3C):   -26.5 % 2D Strain GLS  (A4C):   -28.1 % 2D Strain GLS Avg:     -26.4 %  3D Volume EF: 3D EF:        67 % LV EDV:       119 ml LV ESV:       39 ml LV SV:        80 ml  RIGHT VENTRICLE             IVC RV Basal diam:  2.90 cm     IVC diam: 2.30 cm RV S prime:     16.50 cm/s TAPSE (M-mode): 3.0 cm  LEFT ATRIUM             Index        RIGHT ATRIUM           Index LA diam:        2.50 cm 1.50 cm/m   RA Pressure: 8.00 mmHg LA Vol (A2C):   39.5 ml 23.72 ml/m  RA Area:     10.70 cm  LA Vol (A4C):   35.5 ml 21.32 ml/m  RA Volume:   21.60 ml  12.97 ml/m LA Biplane Vol: 39.8 ml 23.90 ml/m AORTIC VALVE AV Area (Vmax):    2.42 cm AV Area (Vmean):   2.37 cm AV Area (VTI):     2.35 cm AV Vmax:           187.00 cm/s AV Vmean:          127.000 cm/s AV VTI:            0.390 m AV Peak Grad:      14.0 mmHg AV Mean Grad:      8.0 mmHg LVOT Vmax:         99.90 cm/s LVOT Vmean:        66.500 cm/s LVOT VTI:          0.203 m LVOT/AV VTI ratio: 0.52  AORTA Ao Root diam: 3.60 cm Ao Asc diam:  2.80 cm  MITRAL VALVE               TRICUSPID VALVE Estimated RAP:  8.00 mmHg MV Decel Time: 197 msec MV E velocity: 89.00 cm/s  SHUNTS MV A velocity: 75.00 cm/s  Systemic VTI:  0.20 m MV E/A ratio:  1.19        Systemic Diam: 2.40 cm  Charlton Haws MD Electronically signed by Charlton Haws MD Signature Date/Time: 10/02/2021/4:24:31 PM    Final             Assessment & Plan    1.  Nonobstructive CAD: -Patient recently completed a cardiac calcium scoring that showed a score of 159.  She has a strong family history of coronary artery disease post on her mother and father side. -She reports some left lateral side discomfort and denies any palpitations. -We will have her complete an exercise stress test  2.  Hyperlipidemia: -Patient's last LDL cholesterol was 64 -Continue Lipitor 40 mg daily  3.  Mild MVR: -2D echo completed in 2023 showing EF of 60 to 65% with trivial MVR. -Patient is euvolemic on exam with  no symptoms of shortness of breath today.  4.  GERD: -Patient has history of hiatal hernia and is currently taking Prilosec 20 mg daily for management of GERD symptoms.  Disposition: Follow-up with Verne Carrow, MD or APP in 8 months Shared Decision Making/Informed Consent The risks [chest pain, shortness of breath, cardiac arrhythmias, dizziness, blood pressure fluctuations, myocardial infarction, stroke/transient ischemic attack, nausea, vomiting, allergic reaction, radiation exposure, metallic taste sensation and life-threatening complications (estimated to be 1 in 10,000)], benefits (risk stratification, diagnosing coronary artery disease, treatment guidance) and alternatives of a nuclear stress test were discussed in detail with Ms. Murty and she agrees to proceed.   Medication Adjustments/Labs and Tests Ordered: Current medicines are reviewed at length with the patient today.  Concerns regarding medicines are outlined above.   Signed, Napoleon Form, Leodis Rains, NP 12/23/2022, 12:57 PM [facility]  Note:  This document was prepared using Dragon voice recognition software and may include unintentional dictation errors.

## 2022-12-24 ENCOUNTER — Ambulatory Visit: Payer: BC Managed Care – PPO | Attending: Nurse Practitioner | Admitting: Nurse Practitioner

## 2022-12-24 ENCOUNTER — Encounter: Payer: Self-pay | Admitting: Nurse Practitioner

## 2022-12-24 VITALS — BP 118/78 | HR 64 | Ht 65.5 in | Wt 123.0 lb

## 2022-12-24 DIAGNOSIS — E785 Hyperlipidemia, unspecified: Secondary | ICD-10-CM

## 2022-12-24 DIAGNOSIS — K219 Gastro-esophageal reflux disease without esophagitis: Secondary | ICD-10-CM | POA: Diagnosis not present

## 2022-12-24 DIAGNOSIS — I340 Nonrheumatic mitral (valve) insufficiency: Secondary | ICD-10-CM | POA: Diagnosis not present

## 2022-12-24 DIAGNOSIS — N814 Uterovaginal prolapse, unspecified: Secondary | ICD-10-CM | POA: Diagnosis not present

## 2022-12-24 DIAGNOSIS — I2510 Atherosclerotic heart disease of native coronary artery without angina pectoris: Secondary | ICD-10-CM

## 2022-12-24 DIAGNOSIS — R310 Gross hematuria: Secondary | ICD-10-CM | POA: Diagnosis not present

## 2022-12-24 NOTE — Patient Instructions (Signed)
Medication Instructions:  Your physician recommends that you continue on your current medications as directed. Please refer to the Current Medication list given to you today. *If you need a refill on your cardiac medications before your next appointment, please call your pharmacy*   Lab Work: None ordered If you have labs (blood work) drawn today and your tests are completely normal, you will receive your results only by: MyChart Message (if you have MyChart) OR A paper copy in the mail If you have any lab test that is abnormal or we need to change your treatment, we will call you to review the results.   Testing/Procedures: Your physician has requested that you have an exercise stress myoview. For further information please visit [email or web address] Please follow instruction sheet, as given.   Follow-Up: At [facility], you and your health needs are our priority.  As part of our continuing mission to provide you with exceptional heart care, we have created designated Provider Care Teams.  These Care Teams include your primary Cardiologist (physician) and Advanced Practice Providers (APPs -  Physician Assistants and Nurse Practitioners) who all work together to provide you with the care you need, when you need it.  We recommend signing up for the patient portal called "MyChart".  Sign up information is provided on this After Visit Summary.  MyChart is used to connect with patients for Virtual Visits (Telemedicine).  Patients are able to view lab/test results, encounter notes, upcoming appointments, etc.  Non-urgent messages can be sent to your provider as well.   To learn more about what you can do with MyChart, go to [email or web address].    Your next appointment:   8 month(s)  Provider:   Verne Carrow, MD     Other Instructions

## 2022-12-28 DIAGNOSIS — F411 Generalized anxiety disorder: Secondary | ICD-10-CM | POA: Diagnosis not present

## 2022-12-28 DIAGNOSIS — R310 Gross hematuria: Secondary | ICD-10-CM | POA: Diagnosis not present

## 2022-12-28 DIAGNOSIS — F5101 Primary insomnia: Secondary | ICD-10-CM | POA: Diagnosis not present

## 2022-12-28 NOTE — Addendum Note (Signed)
Addended by: Elizabeth Palau on: 12/28/2022 09:16 AM   Modules accepted: Orders

## 2022-12-29 DIAGNOSIS — D414 Neoplasm of uncertain behavior of bladder: Secondary | ICD-10-CM | POA: Diagnosis not present

## 2022-12-30 ENCOUNTER — Telehealth: Payer: Self-pay | Admitting: *Deleted

## 2022-12-30 NOTE — Telephone Encounter (Signed)
Left message on answering machine. 

## 2022-12-31 ENCOUNTER — Ambulatory Visit (HOSPITAL_COMMUNITY): Payer: BC Managed Care – PPO | Attending: Nurse Practitioner

## 2022-12-31 DIAGNOSIS — I2510 Atherosclerotic heart disease of native coronary artery without angina pectoris: Secondary | ICD-10-CM | POA: Diagnosis not present

## 2022-12-31 DIAGNOSIS — E785 Hyperlipidemia, unspecified: Secondary | ICD-10-CM

## 2022-12-31 LAB — MYOCARDIAL PERFUSION IMAGING
Angina Index: 0
LV dias vol: 59 mL (ref 46–106)
LV sys vol: 17 mL
Nuc Stress EF: 71 %
Peak HR: 146 {beats}/min
Rest HR: 62 {beats}/min
Rest Nuclear Isotope Dose: 11 mCi
SDS: 1
SRS: 0
SSS: 1
ST Depression (mm): 0 mm
Stress Nuclear Isotope Dose: 31.9 mCi
TID: 0.96

## 2022-12-31 MED ORDER — TECHNETIUM TC 99M TETROFOSMIN IV KIT
31.9000 | PACK | Freq: Once | INTRAVENOUS | Status: AC | PRN
  Administered 2022-12-31: 31.9 via INTRAVENOUS

## 2022-12-31 MED ORDER — TECHNETIUM TC 99M TETROFOSMIN IV KIT
11.0000 | PACK | Freq: Once | INTRAVENOUS | Status: AC | PRN
  Administered 2022-12-31: 11 via INTRAVENOUS

## 2023-01-07 ENCOUNTER — Telehealth: Payer: Self-pay | Admitting: Adult Health

## 2023-01-07 NOTE — Telephone Encounter (Signed)
90 Day is fine

## 2023-01-07 NOTE — Telephone Encounter (Signed)
Tammy please advise if its okay to send 90 day supply of Singular. I always like to ask first

## 2023-01-12 ENCOUNTER — Other Ambulatory Visit: Payer: Self-pay

## 2023-01-12 MED ORDER — MONTELUKAST SODIUM 10 MG PO TABS: 10.0000 mg | ORAL_TABLET | Freq: Every day | ORAL | 0 refills | Status: DC

## 2023-01-12 NOTE — Telephone Encounter (Signed)
Singular has been sent to pharmacy. Pt is aware. nfn

## 2023-01-17 ENCOUNTER — Ambulatory Visit
Admission: RE | Admit: 2023-01-17 | Discharge: 2023-01-17 | Disposition: A | Discharge status: Home / Self Care | Payer: BC Managed Care – PPO | Source: Ambulatory Visit | Attending: Adult Health | Admitting: Adult Health

## 2023-01-17 DIAGNOSIS — I700 Atherosclerosis of aorta: Secondary | ICD-10-CM | POA: Diagnosis not present

## 2023-01-17 DIAGNOSIS — J189 Pneumonia, unspecified organism: Secondary | ICD-10-CM

## 2023-01-17 DIAGNOSIS — R911 Solitary pulmonary nodule: Secondary | ICD-10-CM | POA: Diagnosis not present

## 2023-01-24 ENCOUNTER — Other Ambulatory Visit: Payer: Self-pay | Admitting: *Deleted

## 2023-01-24 DIAGNOSIS — K5090 Crohn's disease, unspecified, without complications: Secondary | ICD-10-CM | POA: Diagnosis not present

## 2023-01-24 DIAGNOSIS — K5901 Slow transit constipation: Secondary | ICD-10-CM | POA: Diagnosis not present

## 2023-01-24 DIAGNOSIS — J181 Lobar pneumonia, unspecified organism: Secondary | ICD-10-CM

## 2023-01-24 DIAGNOSIS — R911 Solitary pulmonary nodule: Secondary | ICD-10-CM

## 2023-01-24 NOTE — Progress Notes (Signed)
Called and spoke with patient, provided results/recommendations per Rubye Oaks NP. She has a f/u with Tammy on 5/17.  She verbalized understanding.  Nothing further needed.

## 2023-01-28 ENCOUNTER — Ambulatory Visit (INDEPENDENT_AMBULATORY_CARE_PROVIDER_SITE_OTHER): Payer: BC Managed Care – PPO | Admitting: Adult Health

## 2023-01-28 ENCOUNTER — Encounter: Payer: Self-pay | Admitting: Adult Health

## 2023-01-28 VITALS — BP 106/58 | HR 66 | Temp 97.7°F | Ht 65.0 in | Wt 123.2 lb

## 2023-01-28 DIAGNOSIS — J479 Bronchiectasis, uncomplicated: Secondary | ICD-10-CM

## 2023-01-28 DIAGNOSIS — M2560 Stiffness of unspecified joint, not elsewhere classified: Secondary | ICD-10-CM

## 2023-01-28 DIAGNOSIS — J189 Pneumonia, unspecified organism: Secondary | ICD-10-CM

## 2023-01-28 DIAGNOSIS — J4530 Mild persistent asthma, uncomplicated: Secondary | ICD-10-CM

## 2023-01-28 NOTE — Progress Notes (Unsigned)
@Patient  ID: Jessica Bates, female    DOB: 12/25/1960, 62 y.o.   MRN: [id number]  Chief Complaint  Patient presents with   Follow-up    Referring provider: Shon Hale, *  HPI: 62 year old female former smoker followed for mild bronchiectasis, chronic cough, pulmonary nodule, mild intermittent asthma and allergic rhinitis Medical history significant for Crohn's disease and Raynaud's syndrome  TEST/EVENTS :  PFTs>> 06/14/2017>> mild obstruction>> slightly decreased DLCO   FENO>> 06/13/2017>>18 ppb   CXR>> 06/13/2017>> No active cardiopulmonary disease.    CT Chest 05/17/2017>Continued improvement in post inflammatory scarring in the RIGHT middle lobe and lingula. Stable small pulmonary nodule.   CT chest October 2019 showed stable 4 mm right middle lobe nodule.  Similar appearance of mild right middle lobe and lingular bronchiectasis and small airway impaction.   CT chest November 2020 showed a stable 4 mm right upper lobe nodule consistent with a benign etiology.  Mild bilateral pleural parenchymal scarring.  01/28/2023 Follow up : Bronchiectasis, asthma, allergic rhinitis, pulmonary nodule Patient returns for a 3-month follow-up.  Patient has mild intermittent asthma.  Uses Symbicort seasonally usually in the springtime.  Takes Zyrtec, Nasacort and Singulair daily.  Says overall breathing is doing okay.  Denies any increased albuterol use.  Previous PFTs showed minimal airflow obstruction with a slightly decreased diffusing capacity.  No cough , no fever or  Walks 2 miles day  Weight is lower .    Has history of bronchiectasis and pulmonary nodules on CT scan.  Serial CT scan 2018 through 2020 showed stable small pulmonary nodule in the right middle to upper lobe. Patient had a coronary CT December 13, 2022 that showed new consolidations in the right middle lobe and lingula and left lower lobe.   Apriso . -prev on humira and remicade ,  , Magod . -tx w/ Levaquin x7 dayt   Crohns  Rheumatolgology -years ago.   FH : Aunt and brothe with crohns   Bronch   Joint stiffness -hands , hips,  Coldness in feet and hands  Dry eyes and mouth   Talked with Dr. Marchelle Gearing - gets labs and pft  Hold off on bronch  Ct chest in 4 montsh    Allergies  Allergen Reactions   Cymbalta [Duloxetine Hcl]     flushing   Doxycycline Photosensitivity   Duloxetine     Other Reaction(s): Other (See Comments)  flushing    Pt unsure     Pt unsure   Remicade [Infliximab]     Flushing   Penicillins Rash    Did it involve swelling of the face/tongue/throat, SOB, or low BP? No Did it involve sudden or severe rash/hives, skin peeling, or any reaction on the inside of your mouth or nose? No Did you need to seek medical attention at a hospital or doctor's office? No When did it last happen?      30 + years If all above answers are "NO", may proceed with cephalosporin use.     Immunization History  Administered Date(s) Administered   Influenza Split 06/13/2018, 06/15/2019   Influenza Whole 07/28/2020   Influenza,inj,Quad PF,6+ Mos 06/14/2015, 06/13/2016, 06/15/2019, 06/01/2021, 08/10/2022   Influenza-Unspecified 06/12/2017   Janssen (J&J) SARS-COV-2 Vaccination 11/23/2019   PFIZER(Purple Top)SARS-COV-2 Vaccination 06/15/2020, 07/06/2020, 10/25/2020   Pneumococcal Conjugate-13 12/31/2016   Pneumococcal Polysaccharide-23 12/31/2016   Tdap 06/19/2008, 06/15/2013    Past Medical History:  Diagnosis Date   Asthma    bronchiectesis   Crohn disease (HCC)  Depression meopause   Dyslipidemia    Fibromyalgia    GERD (gastroesophageal reflux disease)    Hemorrhoids    Hiatal hernia    Melanoma (HCC)    Pulmonary nodules     Tobacco History: Social History   Tobacco Use  Smoking Status Former   Packs/day: 1.00   Years: 9.00   Additional pack years: 0.00   Total pack years: 9.00   Types: Cigarettes   Quit date: 09/13/1998   Years since quitting: 24.3   Smokeless Tobacco Never   Counseling given: Not Answered   Outpatient Medications Prior to Visit  Medication Sig Dispense Refill   acyclovir (ZOVIRAX) 400 MG tablet Take 400 mg by mouth daily.     albuterol (VENTOLIN HFA) 108 (90 Base) MCG/ACT inhaler Inhale 2 puffs into the lungs every 6 (six) hours as needed for wheezing or shortness of breath. 18 g 6   ALPRAZolam (XANAX) 0.25 MG tablet Take 0.125 mg by mouth daily as needed for anxiety.      atorvastatin (LIPITOR) 40 MG tablet Take 40 mg by mouth at bedtime.      budesonide-formoterol (SYMBICORT) 80-4.5 MCG/ACT inhaler INHALE 2 PUFFS BY MOUTH TWICE DAILY 3 each 4   cetirizine (ZYRTEC) 10 MG tablet Take 10 mg by mouth daily.     cholecalciferol (VITAMIN D3) 25 MCG (1000 UT) tablet Take 1,000 Units by mouth daily.     estradiol (VIVELLE-DOT) 0.05 MG/24HR patch Place 1 patch onto the skin 2 (two) times a week.     mesalamine (APRISO) 0.375 g 24 hr capsule Take 750 mg by mouth daily.      methocarbamol (ROBAXIN) 500 MG tablet Take 500 mg by mouth daily as needed (headaches).   1   montelukast (SINGULAIR) 10 MG tablet Take 1 tablet (10 mg total) by mouth at bedtime. 90 tablet 0   omeprazole (PRILOSEC) 20 MG capsule Take 20 mg by mouth daily as needed (acid reflux).     Respiratory Therapy Supplies (FLUTTER) DEVI Use flutter valve 3 times a day 1 each 0   Zinc 30 MG CAPS Take 30 mg by mouth daily as needed.     zolpidem (AMBIEN) 10 MG tablet Take 10 mg by mouth at bedtime.     No facility-administered medications prior to visit.     Review of Systems:   Constitutional:   No  weight loss, night sweats,  Fevers, chills, fatigue, or  lassitude.  HEENT:   No headaches,  Difficulty swallowing,  Tooth/dental problems, or  Sore throat,                No sneezing, itching, ear ache, nasal congestion, post nasal drip,   CV:  No chest pain,  Orthopnea, PND, swelling in lower extremities, anasarca, dizziness, palpitations, syncope.   GI  No  heartburn, indigestion, abdominal pain, nausea, vomiting, diarrhea, change in bowel habits, loss of appetite, bloody stools.   Resp: No shortness of breath with exertion or at rest.  No excess mucus, no productive cough,  No non-productive cough,  No coughing up of blood.  No change in color of mucus.  No wheezing.  No chest wall deformity  Skin: no rash or lesions.  GU: no dysuria, change in color of urine, no urgency or frequency.  No flank pain, no hematuria   MS:  No joint pain or swelling.  No decreased range of motion.  No back pain.    Physical Exam  BP (!) 106/58 (BP Location:  Left Arm, Patient Position: Sitting, Cuff Size: Normal)   Pulse 66   Temp 97.7 F (36.5 C) (Oral)   Ht 5\' 5"  (1.651 m)   Wt 123 lb 3.2 oz (55.9 kg)   LMP 02/14/2014 Comment: bcp//A.C.  SpO2 100%   BMI 20.50 kg/m   GEN: A/Ox3; pleasant , NAD, well nourished    HEENT:  [place]/AT,  EACs-clear, TMs-wnl, NOSE-clear, THROAT-clear, no lesions, no postnasal drip or exudate noted.   NECK:  Supple w/ fair ROM; no JVD; normal carotid impulses w/o bruits; no thyromegaly or nodules palpated; no lymphadenopathy.    RESP  Clear  P & A; w/o, wheezes/ rales/ or rhonchi. no accessory muscle use, no dullness to percussion  CARD:  RRR, no m/r/g, no peripheral edema, pulses intact, no cyanosis or clubbing.  GI:   Soft & nt; nml bowel sounds; no organomegaly or masses detected.   Musco: Warm bil, no deformities or joint swelling noted.   Neuro: alert, no focal deficits noted.    Skin: Warm, no lesions or rashes    Lab Results:  CBC    Component Value Date/Time   WBC 11.6 (H) 08/28/2019 0524   RBC 3.35 (L) 08/28/2019 0524   HGB 10.7 (L) 08/28/2019 0524   HCT 34.5 (L) 08/28/2019 0524   PLT 257 08/28/2019 0524   MCV 103.0 (H) 08/28/2019 0524   MCH 31.9 08/28/2019 0524   MCHC 31.0 08/28/2019 0524   RDW 11.7 08/28/2019 0524   LYMPHSABS 2.5 11/07/2016 1157   MONOABS 0.9 11/07/2016 1157   EOSABS 0.0  11/07/2016 1157   BASOSABS 0.0 11/07/2016 1157    BMET    Component Value Date/Time   NA 139 08/23/2019 1345   K 4.0 08/23/2019 1345   CL 105 08/23/2019 1345   CO2 27 08/23/2019 1345   GLUCOSE 114 (H) 08/23/2019 1345   BUN 12 08/23/2019 1345   CREATININE 0.55 08/23/2019 1345   CALCIUM 9.0 08/23/2019 1345   GFRNONAA >60 08/23/2019 1345   GFRAA >60 08/23/2019 1345    BNP No results found for: "BNP"  ProBNP No results found for: "PROBNP"  Imaging: CT Chest Wo Contrast  Result Date: 01/20/2023 CLINICAL DATA:  Number Sonia. EXAM: CT CHEST WITHOUT CONTRAST TECHNIQUE: Multidetector CT imaging of the chest was performed following the standard protocol without IV contrast. RADIATION DOSE REDUCTION: This exam was performed according to the departmental dose-optimization program which includes automated exposure control, adjustment of the mA and/or kV according to patient size and/or use of iterative reconstruction technique. COMPARISON:  12/13/2022 FINDINGS: Cardiovascular: The heart size is normal. No substantial pericardial effusion. Coronary artery calcification is evident. Mild atherosclerotic calcification is noted in the wall of the thoracic aorta. Mediastinum/Nodes: No mediastinal lymphadenopathy. No evidence for gross hilar lymphadenopathy although assessment is limited by the lack of intravenous contrast on the current study. The esophagus has normal imaging features. There is no axillary lymphadenopathy. Lungs/Pleura: Clear interval improvement without complete resolution of the right middle lobe consolidative opacity seen previously. There is some residual peripheral airspace consolidation and small airway impaction (see image 97/5). Similar appearance of chronic atelectasis or scarring in the lingula. The ill-defined multifocal and nodular airspace disease in the posterior left lower lobe previously has resolved. There is some residual trace architectural distortion suggesting scarring  (CT 84/5). 4 mm subpleural right middle lobe pulmonary nodule identified previously is stable on 85/5 today. 6 mm right lower lobe nodule on 102/5 is new in the interval. No pleural effusion.  Upper Abdomen: Tiny hypodensity in the hepatic dome is unchanged since abdomen CT of 12/29/2020 consistent with benign etiology such as a cyst. No followup imaging is recommended. Musculoskeletal: No worrisome lytic or sclerotic osseous abnormality. IMPRESSION: 1. Clear interval improvement without complete resolution of the right middle lobe consolidative opacity seen previously. There is some residual peripheral airspace consolidation and small airway impaction in this region today. Findings compatible with resolving infectious/inflammatory etiology and imaging improvement may lag behind clinical improvement. Repeat CT chest in 3 months could be used to reassess. 2. The ill-defined multifocal and nodular airspace disease in the posterior left lower lobe previously has resolved. There is some residual trace architectural distortion in this region today, suggesting scarring. 3. Stable 4 mm subpleural right middle lobe pulmonary with a new 6 mm right lower lobe pulmonary nodule. Non-contrast chest CT at 6-12 months is recommended. If the nodule is stable at time of repeat CT, then future CT at 18-24 months (from today's scan) is considered optional for low-risk patients, but is recommended for high-risk patients. This recommendation follows the consensus statement: Guidelines for Management of Incidental Pulmonary Nodules Detected on CT Images: From the Fleischner Society 2017; Radiology 2017; 284:228-243. 4.  Aortic Atherosclerosis (ICD10-I70.0). Electronically Signed   By: Kennith Center M.D.   On: 01/20/2023 11:32   MYOCARDIAL PERFUSION IMAGING  Result Date: 12/31/2022   The study is normal. The study is low risk.   No ST deviation was noted.   LV perfusion is normal.   Left ventricular function is normal. Nuclear stress  EF: 71 %. The left ventricular ejection fraction is hyperdynamic (>65%). End diastolic cavity size is normal.   Prior study not available for comparison. Low risk stress nuclear study with normal perfusion and normal left ventricular systolic function.   technetium tetrofosmin (TC-MYOVIEW) injection 11 millicurie     Date Action Dose Route User   12/31/2022 0750 Contrast Given 11 millicurie Intravenous Susa Loffler A      technetium tetrofosmin (TC-MYOVIEW) injection 31.9 millicurie     Date Action Dose Route User   12/31/2022 0910 Contrast Given 31.9 millicurie Intravenous Susa Loffler A          Latest Ref Rng & Units 06/14/2017    4:03 PM  PFT Results  FVC-Pre L 3.15   FVC-Predicted Pre % 88   FVC-Post L 3.14   FVC-Predicted Post % 88   Pre FEV1/FVC % % 72   Post FEV1/FCV % % 77   FEV1-Pre L 2.27   FEV1-Predicted Pre % 82   FEV1-Post L 2.43   DLCO uncorrected ml/min/mmHg 16.68   DLCO UNC% % 65   DLCO corrected ml/min/mmHg 16.11   DLCO COR %Predicted % 62   DLVA Predicted % 73   TLC L 5.02   TLC % Predicted % 96   RV % Predicted % 103     Lab Results  Component Value Date   NITRICOXIDE 18 06/13/2017        Assessment & Plan:   No problem-specific Assessment & Plan notes found for this encounter.    I spent  45  minutes dedicated to the care of this patient on the date of this encounter to include pre-visit review of records, face-to-face time with the patient discussing conditions above, post visit ordering of testing, clinical documentation with the electronic health record, making appropriate referrals as documented, and communicating necessary findings to members of the patients care team.    Rubye Oaks, NP 01/28/2023

## 2023-01-28 NOTE — Patient Instructions (Addendum)
Set up for PFT  Labs today.  CT chest in 4 months as planned.  Continue on Symbicort 80 2 puffs twice daily, rinse after use Albuterol inhaler As needed   Continue on Zyrtec , Nasacort and Singulair .  Activity as tolerated.  Mucinex DM twice daily as needed for cough and congestion Use flutter valve 2 times.  Restart on Omeprazole 20mg  daily GERD diet  Follow up with Dr. Marchelle Gearing in 4-6 weeks and As needed   Please contact office for sooner follow up if symptoms do not improve or worsen or seek emergency care

## 2023-01-29 LAB — IGG, IGA, IGM: Immunoglobulin A: 145 mg/dL (ref 70–320)

## 2023-01-29 LAB — RHEUMATOID FACTOR: Rheumatoid fact SerPl-aCnc: <10 IU/mL (ref <14)

## 2023-01-29 LAB — ANTI-SCLERODERMA ANTIBODY: Scleroderma (Scl-70) (ENA) Antibody, IgG: <1 AI

## 2023-01-30 LAB — CYCLIC CITRUL PEPTIDE ANTIBODY, IGG: Cyclic Citrullin Peptide Ab: <16 UNITS

## 2023-01-30 LAB — SJOGRENS SYNDROME-A EXTRACTABLE NUCLEAR ANTIBODY: SSA (Ro) (ENA) Antibody, IgG: <1 AI

## 2023-01-30 LAB — ANTI-DNA ANTIBODY, DOUBLE-STRANDED: ds DNA Ab: 1 IU/mL

## 2023-01-30 LAB — IGG, IGA, IGM
IgG (Immunoglobin G), Serum: 1019 mg/dL (ref 600–1540)
IgM, Serum: 52 mg/dL (ref 50–300)

## 2023-01-30 LAB — SJOGRENS SYNDROME-B EXTRACTABLE NUCLEAR ANTIBODY: SSB (La) (ENA) Antibody, IgG: <1 AI

## 2023-01-30 LAB — ANCA SCREEN W REFLEX TITER: ANCA SCREEN: NEGATIVE

## 2023-01-31 DIAGNOSIS — J189 Pneumonia, unspecified organism: Secondary | ICD-10-CM | POA: Insufficient documentation

## 2023-01-31 NOTE — Assessment & Plan Note (Addendum)
Encouraged on mucociliary clearance.  Continue with flutter valve.  Continue with bronchodilator  Check PFTs and return.  Plan on CT chest in 4 months. Check autoimmune/connective tissue labs.  Check immunoglobulins. Case discussed with Dr. Marchelle Gearing   Plan  Patient Instructions  Set up for PFT  Labs today.  CT chest in 4 months as planned.  Continue on Symbicort 80 2 puffs twice daily, rinse after use Albuterol inhaler As needed   Continue on Zyrtec , Nasacort and Singulair .  Activity as tolerated.  Mucinex DM twice daily as needed for cough and congestion Use flutter valve 2 times.  Restart on Omeprazole 20mg  daily GERD diet  Follow up with Dr. Marchelle Gearing in 4-6 weeks and As needed   Please contact office for sooner follow up if symptoms do not improve or worsen or seek emergency care

## 2023-01-31 NOTE — Assessment & Plan Note (Signed)
Mild intermittent/persistent asthma.  Recommend patient use Symbicort 2 puffs twice daily. Continue on trigger prevention.  Check PFTs on return Could consider RAST panel on return  Plan  Patient Instructions  Set up for PFT  Labs today.  CT chest in 4 months as planned.  Continue on Symbicort 80 2 puffs twice daily, rinse after use Albuterol inhaler As needed   Continue on Zyrtec , Nasacort and Singulair .  Activity as tolerated.  Mucinex DM twice daily as needed for cough and congestion Use flutter valve 2 times.  Restart on Omeprazole 20mg  daily GERD diet  Follow up with Dr. Marchelle Gearing in 4-6 weeks and As needed   Please contact office for sooner follow up if symptoms do not improve or worsen or seek emergency care

## 2023-01-31 NOTE — Assessment & Plan Note (Signed)
Recent CT chest showed bilateral nodular airspace opacities consistent with pneumonia.  Treated with 7-day course of antibiotics with Levaquin.  Follow-up CT chest on May 9 showed complete resolution of right middle lobe consolidation.  And resolution of left lower lobe consolidation.  New right lower lobe pulmonary nodule noted and some residual peripheral airspace consolidation and small airway impaction on the right.  Will follow-up CT chest in 4 months.  Patient is unable to give sputum culture.  If symptoms continue and continue to have flareups.  May need to consider bronchoscopy with BAL with cultures for fungal and AFB.  Plan  Patient Instructions  Set up for PFT  Labs today.  CT chest in 4 months as planned.  Continue on Symbicort 80 2 puffs twice daily, rinse after use Albuterol inhaler As needed   Continue on Zyrtec , Nasacort and Singulair .  Activity as tolerated.  Mucinex DM twice daily as needed for cough and congestion Use flutter valve 2 times.  Restart on Omeprazole 20mg  daily GERD diet  Follow up with Dr. Marchelle Gearing in 4-6 weeks and As needed   Please contact office for sooner follow up if symptoms do not improve or worsen or seek emergency care

## 2023-02-15 ENCOUNTER — Telehealth: Payer: Self-pay | Admitting: Adult Health

## 2023-02-15 MED ORDER — AZITHROMYCIN 250 MG PO TABS: ORAL_TABLET | ORAL | 0 refills | Status: DC

## 2023-02-15 NOTE — Telephone Encounter (Signed)
Patient called back to inform us she has a fever. Patient phone number is [phone].

## 2023-02-15 NOTE — Telephone Encounter (Signed)
Patient is returning phone call. Patient phone number is [phone].

## 2023-02-15 NOTE — Telephone Encounter (Signed)
Z-Pak No. 1 take as directed May try Mucinex dm twice daily as needed for cough congestion.  Continue on current maintenance regimen Fluids and rest. Tylenol as needed If symptoms or not improving will need office visit for further evaluation and treatment Please contact office for sooner follow up if symptoms do not improve or worsen or seek emergency care

## 2023-02-15 NOTE — Telephone Encounter (Signed)
Pt states she is getting over Pneumonia and now has a cold. Seeks an Antibx to be called in.  Pharm is [facility].  Her number [phone]

## 2023-02-15 NOTE — Telephone Encounter (Signed)
ATC X1 LVM for patient to call the office back 

## 2023-02-15 NOTE — Telephone Encounter (Signed)
Spoke with patient. She complains of productive cough with yellow/brown phlegm, headache, runny nose. Patient believes she has a cold. Symptoms started Saturday Patient has been using mucinex DM, zyrtec, allegra and using flutter valve. Patient is also using Symbicort inhaler  Pharmacy [facility] in [place] can you please advise?

## 2023-02-15 NOTE — Telephone Encounter (Signed)
Spoke with patient. Went over [employer] from [place]. Advised patient to call office for a appt if no improvement. She verbalized understanding. NFN

## 2023-02-16 DIAGNOSIS — R519 Headache, unspecified: Secondary | ICD-10-CM | POA: Diagnosis not present

## 2023-02-16 DIAGNOSIS — J209 Acute bronchitis, unspecified: Secondary | ICD-10-CM | POA: Diagnosis not present

## 2023-02-16 DIAGNOSIS — Z03818 Encounter for observation for suspected exposure to other biological agents ruled out: Secondary | ICD-10-CM | POA: Diagnosis not present

## 2023-02-16 DIAGNOSIS — R0981 Nasal congestion: Secondary | ICD-10-CM | POA: Diagnosis not present

## 2023-02-21 ENCOUNTER — Telehealth: Payer: Self-pay | Admitting: Adult Health

## 2023-02-21 MED ORDER — LEVOFLOXACIN 500 MG PO TABS: 500.0000 mg | ORAL_TABLET | Freq: Every day | ORAL | 0 refills | Status: AC

## 2023-02-21 NOTE — Telephone Encounter (Signed)
Spoke with the pt  She states that she is feeling only slightly better after zpack  She is coughing up light yellow sputum  She is blowing clear sputum from her nose  She states that she feels tired and lightheaded, esp towards end of the day  She has not had any fever in 3 days  She is taking all meds as prescribed including mucinex and sudafed  Tammy, we do not have anywhere to put her this wk  Please advise, thanks!  Allergies  Allergen Reactions   Cymbalta [Duloxetine Hcl]     flushing   Doxycycline Photosensitivity   Duloxetine     Other Reaction(s): Other (See Comments)  flushing    Pt unsure     Pt unsure   Remicade [Infliximab]     Flushing   Penicillins Rash    Did it involve swelling of the face/tongue/throat, SOB, or low BP? No Did it involve sudden or severe rash/hives, skin peeling, or any reaction on the inside of your mouth or nose? No Did you need to seek medical attention at a hospital or doctor's office? No When did it last happen?      30 + years If all above answers are "NO", may proceed with cephalosporin use.

## 2023-02-21 NOTE — Telephone Encounter (Signed)
PT was told to make appt if still not better. She is still not well. No appt until July 9th. Once she knew this she wanted to speak with Tammy. Her # is [phone]

## 2023-02-21 NOTE — Telephone Encounter (Signed)
Complains that was on vacation, came back with cough, congestion, nasal congestion, sick for last week. Covid and flu test negative. Called in Zpack no significant improvement.  Coughing up thick brown mucus.  Has an upcoming appointment in 2 weeks in the office with Dr. Marchelle Gearing. Will call in Levaquin 500 mg daily for 7 days.  Patient has several antibiotic allergies.  Patient education on antibiotic therapy. Rest and fluids.  May use Mucinex as needed.  Continue with Symbicort twice daily.  Albuterol as needed.  Flutter valve as needed. Follow-up as planned and as needed  Please contact office for sooner follow up if symptoms do not improve or worsen or seek emergency care  Patient aware

## 2023-03-01 ENCOUNTER — Encounter (HOSPITAL_BASED_OUTPATIENT_CLINIC_OR_DEPARTMENT_OTHER): Payer: Self-pay

## 2023-03-02 ENCOUNTER — Telehealth (HOSPITAL_BASED_OUTPATIENT_CLINIC_OR_DEPARTMENT_OTHER): Payer: Self-pay | Admitting: Internal Medicine

## 2023-03-02 NOTE — Telephone Encounter (Signed)
When rescheduling patients appointment due to PFT tech being out patient would like to wait for her f/u appointment to be after her CT scan as well as the PFT. Please advise, patient is due for scan 05/2023 and did not wish to schedule her OV until scan is completed.

## 2023-03-02 NOTE — Telephone Encounter (Signed)
Dr Marchelle Gearing said this [facility] rescheduling patients appointment due to PFT tech being out patient would like to wait for her f/u appointment to be after her CT scan as well as the PFT. Please advise, patient is due for scan 05/2023 and did not wish to schedule her OV until scan is completed.] is okay. Nothing further needed.

## 2023-03-02 NOTE — Telephone Encounter (Signed)
This is fine 

## 2023-03-07 ENCOUNTER — Encounter (HOSPITAL_BASED_OUTPATIENT_CLINIC_OR_DEPARTMENT_OTHER): Payer: BC Managed Care – PPO

## 2023-03-07 DIAGNOSIS — M2550 Pain in unspecified joint: Secondary | ICD-10-CM | POA: Diagnosis not present

## 2023-03-07 DIAGNOSIS — N951 Menopausal and female climacteric states: Secondary | ICD-10-CM | POA: Diagnosis not present

## 2023-03-07 DIAGNOSIS — N898 Other specified noninflammatory disorders of vagina: Secondary | ICD-10-CM | POA: Diagnosis not present

## 2023-03-07 DIAGNOSIS — R232 Flushing: Secondary | ICD-10-CM | POA: Diagnosis not present

## 2023-03-08 ENCOUNTER — Telehealth: Payer: Self-pay | Admitting: Internal Medicine

## 2023-03-08 NOTE — Telephone Encounter (Signed)
Taryn please see note and get Ct scheduled before her follow up

## 2023-03-08 NOTE — Telephone Encounter (Signed)
Pt calling in wanting to get her CT scan done earlier than Sept, somewhere near her PFT 08/26

## 2023-03-09 ENCOUNTER — Ambulatory Visit: Payer: BC Managed Care – PPO | Admitting: Internal Medicine

## 2023-03-11 ENCOUNTER — Ambulatory Visit: Payer: BC Managed Care – PPO | Admitting: Internal Medicine

## 2023-04-20 ENCOUNTER — Encounter: Payer: Self-pay | Admitting: Adult Health

## 2023-05-02 DIAGNOSIS — Z6820 Body mass index (BMI) 20.0-20.9, adult: Secondary | ICD-10-CM | POA: Diagnosis not present

## 2023-05-02 DIAGNOSIS — R5383 Other fatigue: Secondary | ICD-10-CM | POA: Diagnosis not present

## 2023-05-02 DIAGNOSIS — R6882 Decreased libido: Secondary | ICD-10-CM | POA: Diagnosis not present

## 2023-05-02 DIAGNOSIS — N951 Menopausal and female climacteric states: Secondary | ICD-10-CM | POA: Diagnosis not present

## 2023-05-09 ENCOUNTER — Ambulatory Visit (INDEPENDENT_AMBULATORY_CARE_PROVIDER_SITE_OTHER): Payer: BC Managed Care – PPO | Admitting: Internal Medicine

## 2023-05-09 DIAGNOSIS — J4520 Mild intermittent asthma, uncomplicated: Secondary | ICD-10-CM

## 2023-05-09 DIAGNOSIS — J479 Bronchiectasis, uncomplicated: Secondary | ICD-10-CM

## 2023-05-09 LAB — PULMONARY FUNCTION TEST
DL/VA % pred: 94 %
DL/VA: 3.96 ml/min/mmHg/L
DLCO cor % pred: 93 %
DLCO cor: 19.46 ml/min/mmHg
DLCO unc % pred: 93 %
DLCO unc: 19.46 ml/min/mmHg
FEF 25-75 Post: 2.2 L/s
FEF 25-75 Pre: 1.91 L/s
FEF2575-%Change-Post: 14 %
FEF2575-%Pred-Post: 93 %
FEF2575-%Pred-Pre: 80 %
FEV1-%Change-Post: 4 %
FEV1-%Pred-Post: 89 %
FEV1-%Pred-Pre: 86 %
FEV1-Post: 2.36 L
FEV1-Pre: 2.26 L
FEV1FVC-%Change-Post: -2 %
FEV1FVC-%Pred-Pre: 100 %
FEV6-%Change-Post: 6 %
FEV6-%Pred-Post: 94 %
FEV6-%Pred-Pre: 88 %
FEV6-Post: 3.09 L
FEV6-Pre: 2.9 L
FEV6FVC-%Change-Post: 0 %
FEV6FVC-%Pred-Post: 103 %
FEV6FVC-%Pred-Pre: 103 %
FVC-%Change-Post: 7 %
FVC-%Pred-Post: 91 %
FVC-%Pred-Pre: 85 %
FVC-Post: 3.1 L
FVC-Pre: 2.9 L
Post FEV1/FVC ratio: 76 %
Post FEV6/FVC ratio: 100 %
Pre FEV1/FVC ratio: 78 %
Pre FEV6/FVC Ratio: 100 %
RV % pred: 101 %
RV: 2.1 L
TLC % pred: 103 %
TLC: 5.38 L

## 2023-05-09 NOTE — Progress Notes (Signed)
Full PFT performed today. °

## 2023-05-09 NOTE — Patient Instructions (Signed)
Full PFT performed today. °

## 2023-05-13 DIAGNOSIS — R159 Full incontinence of feces: Secondary | ICD-10-CM | POA: Diagnosis not present

## 2023-05-13 DIAGNOSIS — K5000 Crohn's disease of small intestine without complications: Secondary | ICD-10-CM | POA: Diagnosis not present

## 2023-05-13 DIAGNOSIS — D414 Neoplasm of uncertain behavior of bladder: Secondary | ICD-10-CM | POA: Diagnosis not present

## 2023-05-17 ENCOUNTER — Ambulatory Visit
Admission: RE | Admit: 2023-05-17 | Discharge: 2023-05-17 | Disposition: A | Discharge status: Home / Self Care | Payer: BC Managed Care – PPO | Source: Ambulatory Visit | Attending: Adult Health | Admitting: Adult Health

## 2023-05-17 DIAGNOSIS — J90 Pleural effusion, not elsewhere classified: Secondary | ICD-10-CM | POA: Diagnosis not present

## 2023-05-17 DIAGNOSIS — I700 Atherosclerosis of aorta: Secondary | ICD-10-CM | POA: Diagnosis not present

## 2023-05-17 DIAGNOSIS — J181 Lobar pneumonia, unspecified organism: Secondary | ICD-10-CM

## 2023-05-17 DIAGNOSIS — R911 Solitary pulmonary nodule: Secondary | ICD-10-CM

## 2023-05-17 DIAGNOSIS — I2510 Atherosclerotic heart disease of native coronary artery without angina pectoris: Secondary | ICD-10-CM | POA: Diagnosis not present

## 2023-05-17 DIAGNOSIS — R918 Other nonspecific abnormal finding of lung field: Secondary | ICD-10-CM | POA: Diagnosis not present

## 2023-05-25 ENCOUNTER — Ambulatory Visit: Payer: BC Managed Care – PPO | Admitting: Emergency Medicine

## 2023-05-25 ENCOUNTER — Encounter: Payer: Self-pay | Admitting: Emergency Medicine

## 2023-05-25 VITALS — BP 114/66 | HR 67 | Temp 98.0°F | Ht 65.0 in | Wt 125.8 lb

## 2023-05-25 DIAGNOSIS — J4530 Mild persistent asthma, uncomplicated: Secondary | ICD-10-CM | POA: Diagnosis not present

## 2023-05-25 DIAGNOSIS — R911 Solitary pulmonary nodule: Secondary | ICD-10-CM

## 2023-05-25 DIAGNOSIS — J479 Bronchiectasis, uncomplicated: Secondary | ICD-10-CM

## 2023-05-25 NOTE — Assessment & Plan Note (Signed)
Bronchiectasis evident especially anteriorly in the right middle lobe, lingula.  Surrounding inflammatory changes resolved and her new right lower lobe nodule is resolved.  Overall she feels well.  Only needs a flutter intermittently.  Suspect that she developed bronchiectasis in the past when she was being treated for her Crohn's with Biologics.  She is not on these currently.

## 2023-05-25 NOTE — Progress Notes (Signed)
Subjective:    Patient ID: Jessica Bates, female    DOB: 1961/06/09, 62 y.o.   MRN: [id number]  HPI 62 year old woman, history of tobacco use (9 pack years) with mild bronchiectasis, mild intermittent asthma, chronic rhinitis, chronic cough.  She has underlying Crohn's disease, no evidence of any other rheumatological disorder.  She has been followed in our office by Dr. Marchelle Gearing for her bronchiectasis, pulmonary nodular disease noted on CT chest.  Most recent CT done on 9/3 as below  CT chest 01/17/2023 reviewed by me showed interval improvement going back to cardiac scoring CT 12/13/2022 in the right middle lobe consolidative opacity.  There is some residual peripheral airspace consolidation and small airway impaction with mucous.  Posterior left lower lobe opacity resolved.  Stable 4 mm right middle lobe subpleural nodule with a new 6 mm right lower lobe nodule. Managed on Symbicort, Ventolin which she uses approximately Singulair, Zyrtec, Prilosec as needed.  Uses a flutter valve approximately  CT chest 05/17/2023 reviewed by me shows continued improvement in her right middle lobe inflammatory change with some residual bronchiectasis, less inflammation in the anterior left upper lobe, large-scale resolution of a new 6 mm right lower lobe nodule  PFT done 05/09/23 reviewed    Review of Systems As per HPI  Past Medical History:  Diagnosis Date   Asthma    bronchiectesis   Crohn disease (HCC)    Depression meopause   Dyslipidemia    Fibromyalgia    GERD (gastroesophageal reflux disease)    Hemorrhoids    Hiatal hernia    Melanoma (HCC)    Pulmonary nodules      Family History  Problem Relation Age of Onset   Leukemia Father    CAD Father    Crohn's disease Brother      Social History   Socioeconomic History   Marital status: Married    Spouse name: Not on file   Number of children: 3   Years of education: Not on file   Highest education level: Not on file   Occupational History   Occupation: Works at [employer]  Tobacco Use   Smoking status: Former    Current packs/day: 0.00    Average packs/day: 1 pack/day for 9.0 years (9.0 ttl pk-yrs)    Types: Cigarettes    Start date: 09/13/1989    Quit date: 09/13/1998    Years since quitting: 24.7   Smokeless tobacco: Never  Vaping Use   Vaping status: Never Used  Substance and Sexual Activity   Alcohol use: Yes    Comment: occasional   Drug use: No   Sexual activity: Not on file  Other Topics Concern   Not on file  Social History Narrative   Assistant [employer]    Social Determinants of Health   Financial Resource Strain: Not on file  Food Insecurity: Not on file  Transportation Needs: Not on file  Physical Activity: Not on file  Stress: Not on file  Social Connections: Not on file  Intimate Partner Violence: Not on file     Allergies  Allergen Reactions   Cymbalta [Duloxetine Hcl]     flushing   Doxycycline Photosensitivity   Duloxetine     Other Reaction(s): Other (See Comments)  flushing    Pt unsure     Pt unsure   Remicade [Infliximab]     Flushing   Penicillins Rash    Did it involve swelling of the face/tongue/throat, SOB, or low  BP? No Did it involve sudden or severe rash/hives, skin peeling, or any reaction on the inside of your mouth or nose? No Did you need to seek medical attention at a hospital or doctor's office? No When did it last happen?      30 + years If all above answers are "NO", may proceed with cephalosporin use.      Outpatient Medications Prior to Visit  Medication Sig Dispense Refill   acyclovir (ZOVIRAX) 400 MG tablet Take 400 mg by mouth daily.     albuterol (VENTOLIN HFA) 108 (90 Base) MCG/ACT inhaler Inhale 2 puffs into the lungs every 6 (six) hours as needed for wheezing or shortness of breath. 18 g 6   ALPRAZolam (XANAX) 0.25 MG tablet Take 0.125 mg by mouth daily as needed for anxiety.      atorvastatin  (LIPITOR) 40 MG tablet Take 40 mg by mouth at bedtime.      azithromycin (ZITHROMAX) 250 MG tablet Take as directed 6 tablet 0   budesonide-formoterol (SYMBICORT) 80-4.5 MCG/ACT inhaler INHALE 2 PUFFS BY MOUTH TWICE DAILY 3 each 4   cetirizine (ZYRTEC) 10 MG tablet Take 10 mg by mouth daily.     cholecalciferol (VITAMIN D3) 25 MCG (1000 UT) tablet Take 1,000 Units by mouth daily.     estradiol (VIVELLE-DOT) 0.05 MG/24HR patch Place 1 patch onto the skin 2 (two) times a week.     mesalamine (APRISO) 0.375 g 24 hr capsule Take 750 mg by mouth daily.      methocarbamol (ROBAXIN) 500 MG tablet Take 500 mg by mouth daily as needed (headaches).   1   montelukast (SINGULAIR) 10 MG tablet Take 1 tablet (10 mg total) by mouth at bedtime. 90 tablet 0   omeprazole (PRILOSEC) 20 MG capsule Take 20 mg by mouth daily as needed (acid reflux).     Respiratory Therapy Supplies (FLUTTER) DEVI Use flutter valve 3 times a day 1 each 0   Zinc 30 MG CAPS Take 30 mg by mouth daily as needed.     zolpidem (AMBIEN) 10 MG tablet Take 10 mg by mouth at bedtime.     No facility-administered medications prior to visit.         Objective:   Physical Exam Vitals:   05/25/23 1350  BP: 114/66  Pulse: 67  Temp: 98 F (36.7 C)  TempSrc: Oral  SpO2: 96%  Weight: 125 lb 12.8 oz (57.1 kg)  Height: 5\' 5"  (1.651 m)    Gen: Pleasant, well-nourished, in no distress,  normal affect  ENT: No lesions,  mouth clear,  oropharynx clear, no postnasal drip  Neck: No JVD, no stridor  Lungs: No use of accessory muscles, no crackles, very subtle end expiratory wheeze  Cardiovascular: RRR, heart sounds normal, no murmur or gallops, no peripheral edema  Musculoskeletal: No deformities, no cyanosis or clubbing  Neuro: alert, awake, non focal  Skin: Warm, no lesions or rash       Assessment & Plan:  Bronchiectasis without complication (HCC) Bronchiectasis evident especially anteriorly in the right middle lobe,  lingula.  Surrounding inflammatory changes resolved and her new right lower lobe nodule is resolved.  Overall she feels well.  Only needs a flutter intermittently.  Suspect that she developed bronchiectasis in the past when she was being treated for her Crohn's with Biologics.  She is not on these currently.  Pulmonary nodule, right Resolved  Asthma Mild airflow based on curve of her flow-volume loop.  The raw  numbers are normal.  Okay to continue Symbicort as she does believe she has been benefiting from it.  Albuterol as needed.   Levy Pupa, MD, PhD 05/25/2023, 2:10 PM [facility] [phone] or if no answer before 7:00PM call [phone] For any issues after 7:00PM please call eLink [phone]

## 2023-05-25 NOTE — Patient Instructions (Addendum)
We reviewed your CT scan of the chest and your pulmonary function testing today. Please continue your Symbicort 2 puffs twice a day.  Rinse and gargle after using. Keep your Ventolin available to use if you needed for shortness of breath, chest tightness, wheezing. Keep your flutter valve available to use if you need assistance clearing mucus Continue Singulair, Zyrtec as you have been taking them Follow Dr. Delton Coombes in 6 months, please call sooner if you have any problems.

## 2023-05-25 NOTE — Assessment & Plan Note (Signed)
Resolved

## 2023-05-25 NOTE — Assessment & Plan Note (Signed)
Mild airflow based on curve of her flow-volume loop.  The raw numbers are normal.  Okay to continue Symbicort as she does believe she has been benefiting from it.  Albuterol as needed.

## 2023-06-21 ENCOUNTER — Encounter: Payer: Self-pay | Admitting: Emergency Medicine

## 2023-06-21 DIAGNOSIS — J029 Acute pharyngitis, unspecified: Secondary | ICD-10-CM | POA: Diagnosis not present

## 2023-06-22 ENCOUNTER — Encounter: Payer: Self-pay | Admitting: Emergency Medicine

## 2023-06-23 ENCOUNTER — Encounter: Payer: Self-pay | Admitting: Nurse Practitioner

## 2023-06-23 ENCOUNTER — Other Ambulatory Visit: Payer: Self-pay | Admitting: Adult Health

## 2023-06-23 MED ORDER — LEVOFLOXACIN 500 MG PO TABS: 500.0000 mg | ORAL_TABLET | Freq: Every day | ORAL | 0 refills | Status: DC

## 2023-06-23 NOTE — Telephone Encounter (Signed)
I called and spoke with the pt  She states Saturday 06/18/23 she got both covid and flu vaccines  She woke up 10/6 with body aches and that night developed a fever of 103  She has had fever since then off and on- up to 100 at this point  She feels fatigued and has some chest pressure  She is coughing up minimal green sputum  Was seen at [facility] in [place] on 06/21/23 and tested for flu and covid and these were both neg  She is waiting for her strep test to come back  She says the physician there told her "it's from the shots" and did not offer any tx  She is wondering if she needs abx called in  Please advise, thanks!  Allergies  Allergen Reactions   Cymbalta [Duloxetine Hcl]     flushing   Doxycycline Photosensitivity   Duloxetine     Other Reaction(s): Other (See Comments)  flushing    Pt unsure     Pt unsure   Remicade [Infliximab]     Flushing   Penicillins Rash    Did it involve swelling of the face/tongue/throat, SOB, or low BP? No Did it involve sudden or severe rash/hives, skin peeling, or any reaction on the inside of your mouth or nose? No Did you need to seek medical attention at a hospital or doctor's office? No When did it last happen?      30 + years If all above answers are "NO", may proceed with cephalosporin use.

## 2023-06-23 NOTE — Telephone Encounter (Signed)
I think we should treat he rfor a flare of her bronchiectasis.  Would ask her to take levaquin 500mg  every day x 7 days. Take a probiotic with it. Ok to use OTC cold meds for sx relief.

## 2023-06-23 NOTE — Telephone Encounter (Signed)
PT now calling due to no reply to on [facility]. She is in [place] and wonders if she needs to have an Antibx sent in.  [facility] in [place].

## 2023-06-23 NOTE — Progress Notes (Signed)
06/23/23 after hours call: patient contacted the after clinic answering service. She states that she was told a prescription for Levaquin would be called in for her. She is having fevers, productive, cough with green phlegm, increase shortness of breath and chest congestion. Rx for levaquin 500 mg daily for 7 days Sent to her pharmacy of choice.  aware of side effect profile.

## 2023-06-23 NOTE — Telephone Encounter (Signed)
Please call in RX per Dr. Delton Coombes.

## 2023-06-24 DIAGNOSIS — F5101 Primary insomnia: Secondary | ICD-10-CM | POA: Diagnosis not present

## 2023-06-24 DIAGNOSIS — F411 Generalized anxiety disorder: Secondary | ICD-10-CM | POA: Diagnosis not present

## 2023-06-24 NOTE — Telephone Encounter (Signed)
Patient has already been advised of this and has picked up medication.

## 2023-07-13 DIAGNOSIS — H1089 Other conjunctivitis: Secondary | ICD-10-CM | POA: Diagnosis not present

## 2023-09-01 ENCOUNTER — Telehealth: Payer: Self-pay | Admitting: Emergency Medicine

## 2023-09-01 DIAGNOSIS — J470 Bronchiectasis with acute lower respiratory infection: Secondary | ICD-10-CM | POA: Diagnosis not present

## 2023-09-01 DIAGNOSIS — J4520 Mild intermittent asthma, uncomplicated: Secondary | ICD-10-CM | POA: Diagnosis not present

## 2023-09-01 DIAGNOSIS — R6889 Other general symptoms and signs: Secondary | ICD-10-CM | POA: Diagnosis not present

## 2023-09-01 DIAGNOSIS — U071 COVID-19: Secondary | ICD-10-CM | POA: Diagnosis not present

## 2023-09-01 NOTE — Telephone Encounter (Signed)
I agree with that regimen. She may need some OTC cold relief also

## 2023-09-01 NOTE — Telephone Encounter (Signed)
Called and spoke to patient.  She wanted to make Dr. Delton Coombes aware that she tested positive for covid today at [facility]. Hx with paxlovid, levofloxacin and  tessalon. She wanted to make sure that Dr. Delton Coombes agreed with this plan.  Dr. Delton Coombes, please advise. Thanks

## 2023-09-01 NOTE — Telephone Encounter (Signed)
Patient has Covid and has paxslovid and levoclen that she is taking. She just wanted Dr.Byrum to know and if she needs to stop taking them or taking something else she would like to be advised. Patient  can be reached at [phone].She only wants a call if more is needed.

## 2023-09-01 NOTE — Telephone Encounter (Signed)
I called and spoke with the pt and notified of response per Dr Lamonte Sakai  She verbalized understanding  Nothing further needed

## 2023-09-05 ENCOUNTER — Telehealth: Payer: Self-pay | Admitting: Emergency Medicine

## 2023-09-05 ENCOUNTER — Telehealth: Payer: Self-pay | Admitting: Adult Health

## 2023-09-05 MED ORDER — PREDNISONE 10 MG PO TABS: ORAL_TABLET | ORAL | 0 refills | Status: DC

## 2023-09-05 MED ORDER — ONDANSETRON HCL 4 MG PO TABS: 4.0000 mg | ORAL_TABLET | Freq: Three times a day (TID) | ORAL | 0 refills | Status: AC | PRN

## 2023-09-05 NOTE — Telephone Encounter (Signed)
PT is getting over Covid and is experiencing nausea. She wonders if Dr. Evaristo Bury call in something for her. She has finished her medications but is not sure what is causing it.   Pharm is [facility]  [phone]

## 2023-09-05 NOTE — Telephone Encounter (Signed)
Pt is calling back to see about getting something called in

## 2023-09-05 NOTE — Telephone Encounter (Signed)
Dx with Covid 19 -5 days ago. Seen at urgent care -Rx Paxlovid and Levaquin -has completed  Cough and congestion are better but she remains weak, low energy , low appetite  Drinking lots of fluids.  O2 sats 98%.  No hemoptysis , v/d or chest pain  Has used Zofran to help with mild intermittent nausea. Wants a refill  Advised to advance bland diet and push fluids   Advised if not improving will need ov,  Please contact office for sooner follow up if symptoms do not improve or worsen or seek emergency care

## 2023-09-05 NOTE — Telephone Encounter (Signed)
Primary Pulmonologist: Byrum  Last office visit and with whom: Byrum 05/25/2023 What do we see them for (pulmonary problems): Bronchiectasis, pulmonary nodule, asthma Last OV assessment/plan:   Assessment & Plan Note by Leslye Peer, MD at 05/25/2023 2:09 PM  Author: Leslye Peer, MD Author Type: Physician Filed: 05/25/2023  2:09 PM  Note Status: Written Cosign: Cosign Not Required Encounter Date: 05/25/2023  Problem: Pulmonary nodule, right  Editor: Leslye Peer, MD (Physician)             Resolved        Assessment & Plan Note by Leslye Peer, MD at 05/25/2023 2:09 PM  Author: Leslye Peer, MD Author Type: Physician Filed: 05/25/2023  2:09 PM  Note Status: Written Cosign: Cosign Not Required Encounter Date: 05/25/2023  Problem: Bronchiectasis without complication [facility])  Editor: Leslye Peer, MD (Physician)             Bronchiectasis evident especially anteriorly in the right middle lobe, lingula.  Surrounding inflammatory changes resolved and her new right lower lobe nodule is resolved.  Overall she feels well.  Only needs a flutter intermittently.  Suspect that she developed bronchiectasis in the past when she was being treated for her Crohn's with Biologics.  She is not on these currently.        Patient Instructions by Leslye Peer, MD at 05/25/2023 1:30 PM  Author: Leslye Peer, MD Author Type: Physician Filed: 05/25/2023  2:08 PM  Note Status: Addendum Cosign: Cosign Not Required Encounter Date: 05/25/2023  Editor: Leslye Peer, MD (Physician)      Prior Versions: 1. Leslye Peer, MD (Physician) at 05/25/2023  2:08 PM - Signed  We reviewed your CT scan of the chest and your pulmonary function testing today. Please continue your Symbicort 2 puffs twice a day.  Rinse and gargle after using. Keep your Ventolin available to use if you needed for shortness of breath, chest tightness, wheezing. Keep your flutter valve available to use if you need assistance  clearing mucus Continue Singulair, Zyrtec as you have been taking them Follow Dr. Delton Coombes in 6 months, please call sooner if you have any problems.       Orthostatic Vitals Recorded in This Encounter   05/25/2023 1350     BP Location: Left Arm  Cuff Size: Normal   Instructions   Return in about 6 months (around 11/22/2023) for With Dr. Delton Coombes. We reviewed your CT scan of the chest and your pulmonary function testing today. Please continue your Symbicort 2 puffs twice a day.  Rinse and gargle after using. Keep your Ventolin available to use if you needed for shortness of breath, chest tightness, wheezing. Keep your flutter valve available to use if you need assistance clearing mucus Continue Singulair, Zyrtec as you have been taking them Follow Dr. Delton Coombes in 6 months, please call sooner if you have any problems.       Was appointment offered to patient (explain)?  None available   Reason for call:  Patient was diagnosed at an urgent care in [place] on 12/19.  She is finishing the Paxlovid today and finished the Levaquin today.  She began feeling selt sick Wednesday/Thursday (12/18-12/19) and feels worse now because of the dizziness on that began on Sunday (12/22).  She has no cough.  She has a bad headache and no appetite.  Tinitus is resolving.  She does have a lot of sinus congestion especially on the left side.  She has yellowish mucous out when she uses the netipot.  The headache is across her forehead and on left side where she has the sinus congestion on cheekbones.  She feels like maybe the Paxlovid is causing the nausea.  Advised I would send a message to a provider and once we hear back we will call her back with recommendations.   (examples of things to ask: : When did symptoms start? Fever? Cough? Productive? Color to sputum? More sputum than usual? Wheezing? Have you needed increased oxygen? Are you taking your respiratory medications? What over the counter measures have you  tried?)  Allergies  Allergen Reactions   Cymbalta [Duloxetine Hcl]     flushing   Doxycycline Photosensitivity   Duloxetine     Other Reaction(s): Other (See Comments)  flushing    Pt unsure     Pt unsure   Remicade [Infliximab]     Flushing   Penicillins Rash    Did it involve swelling of the face/tongue/throat, SOB, or low BP? No Did it involve sudden or severe rash/hives, skin peeling, or any reaction on the inside of your mouth or nose? No Did you need to seek medical attention at a hospital or doctor's office? No When did it last happen?      30 + years If all above answers are "NO", may proceed with cephalosporin use.     Immunization History  Administered Date(s) Administered   Influenza Split 06/13/2018, 06/15/2019   Influenza Whole 07/28/2020   Influenza,inj,Quad PF,6+ Mos 06/14/2015, 06/13/2016, 06/15/2019, 06/01/2021, 08/10/2022   Influenza-Unspecified 06/12/2017   Janssen (J&J) SARS-COV-2 Vaccination 11/23/2019   PFIZER(Purple Top)SARS-COV-2 Vaccination 06/15/2020, 07/06/2020, 10/25/2020   Pneumococcal Conjugate-13 12/31/2016   Pneumococcal Polysaccharide-23 12/31/2016   Tdap 06/19/2008, 06/15/2013

## 2023-09-05 NOTE — Telephone Encounter (Signed)
I think she is on sufficient medication for covid-19 /sinus infection. Complete paxlovid and levaquin as planned today.   I can send in prednisone which may help with inflammation related to sinusitis. Continue saline nasal spray/netti pot twice daily

## 2023-09-08 NOTE — Telephone Encounter (Signed)
Spoke with pt and she states she did pick up the pred and start taking it yesterday 12/25  She is feeling some better and no new co's  Will call if needed

## 2023-09-15 ENCOUNTER — Telehealth: Payer: Self-pay | Admitting: Emergency Medicine

## 2023-09-15 NOTE — Telephone Encounter (Signed)
 Patient is at home with people that have tested positive for Covid. She would like to know protocol that she can follow so she does not get sick as well. Please call and advise [phone]

## 2023-09-16 NOTE — Telephone Encounter (Signed)
 Pt states she did have Covid 08-31-2023 and she feels better now. Pt states she must have exposed her family because her family now has Covid. I explained to the pt that she could still be a carrier for the virus and a mask would be appropriate if she needed to leave home. I advised pt if she wanted to be sure if she was positive or negative for the Covid 19 virus, she would need to be tested. Pt stated she wanted to be a caregiver for her family who actively have the virus and I explained to the pt that she is still considered exposed to it, regardless of recently getting over having the virus herself. Pt was advised to call our office if need, Pt verbally agreed. Nothing further needed.

## 2023-10-10 DIAGNOSIS — K219 Gastro-esophageal reflux disease without esophagitis: Secondary | ICD-10-CM | POA: Diagnosis not present

## 2023-10-10 DIAGNOSIS — K5090 Crohn's disease, unspecified, without complications: Secondary | ICD-10-CM | POA: Diagnosis not present

## 2023-10-19 DIAGNOSIS — B349 Viral infection, unspecified: Secondary | ICD-10-CM | POA: Diagnosis not present

## 2023-10-19 DIAGNOSIS — H938X9 Other specified disorders of ear, unspecified ear: Secondary | ICD-10-CM | POA: Diagnosis not present

## 2023-10-19 DIAGNOSIS — Z20828 Contact with and (suspected) exposure to other viral communicable diseases: Secondary | ICD-10-CM | POA: Diagnosis not present

## 2023-11-10 DIAGNOSIS — N819 Female genital prolapse, unspecified: Secondary | ICD-10-CM | POA: Diagnosis not present

## 2023-11-10 DIAGNOSIS — R152 Fecal urgency: Secondary | ICD-10-CM | POA: Diagnosis not present

## 2023-11-10 DIAGNOSIS — K6289 Other specified diseases of anus and rectum: Secondary | ICD-10-CM | POA: Diagnosis not present

## 2023-11-10 DIAGNOSIS — R159 Full incontinence of feces: Secondary | ICD-10-CM | POA: Diagnosis not present

## 2023-11-14 DIAGNOSIS — Z01419 Encounter for gynecological examination (general) (routine) without abnormal findings: Secondary | ICD-10-CM | POA: Diagnosis not present

## 2023-11-14 DIAGNOSIS — Z6820 Body mass index (BMI) 20.0-20.9, adult: Secondary | ICD-10-CM | POA: Diagnosis not present

## 2023-11-14 DIAGNOSIS — Z1231 Encounter for screening mammogram for malignant neoplasm of breast: Secondary | ICD-10-CM | POA: Diagnosis not present

## 2023-11-18 DIAGNOSIS — Z79899 Other long term (current) drug therapy: Secondary | ICD-10-CM | POA: Diagnosis not present

## 2023-11-18 DIAGNOSIS — E7800 Pure hypercholesterolemia, unspecified: Secondary | ICD-10-CM | POA: Diagnosis not present

## 2023-11-18 DIAGNOSIS — R7301 Impaired fasting glucose: Secondary | ICD-10-CM | POA: Diagnosis not present

## 2023-11-22 DIAGNOSIS — K219 Gastro-esophageal reflux disease without esophagitis: Secondary | ICD-10-CM | POA: Diagnosis not present

## 2023-11-22 DIAGNOSIS — E7800 Pure hypercholesterolemia, unspecified: Secondary | ICD-10-CM | POA: Diagnosis not present

## 2023-11-22 DIAGNOSIS — R7301 Impaired fasting glucose: Secondary | ICD-10-CM | POA: Diagnosis not present

## 2023-11-22 DIAGNOSIS — Z0000 Encounter for general adult medical examination without abnormal findings: Secondary | ICD-10-CM | POA: Diagnosis not present

## 2023-11-22 DIAGNOSIS — M797 Fibromyalgia: Secondary | ICD-10-CM | POA: Diagnosis not present

## 2023-11-24 NOTE — Progress Notes (Unsigned)
 Cardiology Office Note    Patient Name: Jessica Bates Date of Encounter: 11/24/2023  Primary Care Provider:  Shon Hale, MD Primary Cardiologist:  Verne Carrow, MD Primary Electrophysiologist: None   Past Medical History    Past Medical History:  Diagnosis Date   Asthma    bronchiectesis   Crohn disease (HCC)    Depression meopause   Dyslipidemia    Fibromyalgia    GERD (gastroesophageal reflux disease)    Hemorrhoids    Hiatal hernia    Melanoma (HCC)    Pulmonary nodules     History of Present Illness  Jessica Bates is a 63 y.o. female with PMH of nonobstructive CAD HLD, fibromyalgia, asthma, chest pain, Crohn's disease, Raynaud's, GERD, hiatal hernia, tobacco abuse who presents today for follow-up.  Jessica Bates was last seen on 12/24/2022 with complaint of left lateral chest pain.  She underwent an exercise Myoview that showed no evidence of ischemia.  She is currently followed by pulmonology for management of bronchiectasis and was last seen on 05/25/2023.  Jessica Bates presents today for 1 year follow-up.  She has a history of Crohn's disease, high cholesterol, and a mild mitral valve regurgitation, presents for a routine follow-up. She reports no new symptoms or issues since her last visit in April of the previous year. At that time, she was experiencing left lateral chest pain, which prompted a Myoview stress test. The results of the test were normal, showing no progressive issues or ischemia. [name] EKG from today also appears normal, with no additional beats or T wave inversion or elevation. [name] Crohn's disease is reportedly under control, with her last flare-up occurring in November. She remains active, walking regularly, and maintains a generally good diet. She also reports no issues with swelling around her feet.  In addition to her chronic conditions, the patient had a bout of COVID-19 around the holidays. She was prescribed  levofloxacin and Paxlovid, which she reports made her feel significantly sicker. She has since recovered from the illness. Patient denies chest pain, palpitations, dyspnea, PND, orthopnea, nausea, vomiting, dizziness, syncope, edema, weight gain, or early satiety.   Review of Systems  Please see the history of present illness.    All other systems reviewed and are otherwise negative except as noted above.  Physical Exam    Wt Readings from Last 3 Encounters:  05/25/23 125 lb 12.8 oz (57.1 kg)  01/28/23 123 lb 3.2 oz (55.9 kg)  12/31/22 123 lb (55.8 kg)   YQ:MVHQI were no vitals filed for this visit.,There is no height or weight on file to calculate BMI. GEN: Well nourished, well developed in no acute distress Neck: No JVD; No carotid bruits Pulmonary: Clear to auscultation without rales, wheezing or rhonchi  Cardiovascular: Normal rate. Regular rhythm. Normal S1. Normal S2.   Murmurs: There is no murmur.  ABDOMEN: Soft, non-tender, non-distended EXTREMITIES:  No edema; No deformity   EKG/LABS/ Recent Cardiac Studies   ECG personally reviewed by me today -sinus rhythm with rate of 61 bpm and no acute changes consistent with previous EKG.  Risk Assessment/Calculations:          Lab Results  Component Value Date   WBC 11.6 (H) 08/28/2019   HGB 10.7 (L) 08/28/2019   HCT 34.5 (L) 08/28/2019   MCV 103.0 (H) 08/28/2019   PLT 257 08/28/2019   Lab Results  Component Value Date   CREATININE 0.55 08/23/2019   BUN 12 08/23/2019   NA 139  08/23/2019   K 4.0 08/23/2019   CL 105 08/23/2019   CO2 27 08/23/2019   No results found for: "CHOL", "HDL", "LDLCALC", "LDLDIRECT", "TRIG", "CHOLHDL"  No results found for: "HGBA1C" Assessment & Plan    1.Nonobstructive CAD:  -Exercise Myoview on 12/31/2022 for complaint of chest pain that was low risk and normal with normal heart perfusion noted. -Today the patient reports no chest pain or angina since previous follow-up. -She will add ASA  81 mg to current regimen -Continue Lipitor 40 mg daily  2.  Hyperlipidemia: -Patient's last LDL cholesterol was 71 -Continue atorvastatin 40 mg daily  3.  Chest pain: -Exercise Myoview completed in 12/2022 with no evidence of ischemia or infarction -Patient reports no chest pain or angina since previous follow-up.  4.  Mild MVR: -Patient's last echo was completed on 11/2021 trivial MVR and no wall motion abnormalities. -Patient reports no shortness of breath or anginous since previous follow-up.  5.  History of bronchiectasis: -Patient is currently followed by pulmonology  Disposition: Follow-up with Verne Carrow, MD or APP in 12 months    Signed, Napoleon Form, Leodis Rains, NP 11/24/2023, 5:24 PM [facility]

## 2023-11-25 ENCOUNTER — Ambulatory Visit: Payer: BC Managed Care – PPO | Attending: Cardiology | Admitting: Nurse Practitioner

## 2023-11-25 ENCOUNTER — Encounter: Payer: Self-pay | Admitting: Nurse Practitioner

## 2023-11-25 VITALS — BP 130/62 | HR 61 | Ht 65.0 in | Wt 123.0 lb

## 2023-11-25 DIAGNOSIS — R0789 Other chest pain: Secondary | ICD-10-CM | POA: Diagnosis not present

## 2023-11-25 DIAGNOSIS — E785 Hyperlipidemia, unspecified: Secondary | ICD-10-CM | POA: Diagnosis not present

## 2023-11-25 DIAGNOSIS — J479 Bronchiectasis, uncomplicated: Secondary | ICD-10-CM | POA: Diagnosis not present

## 2023-11-25 DIAGNOSIS — I340 Nonrheumatic mitral (valve) insufficiency: Secondary | ICD-10-CM

## 2023-11-25 DIAGNOSIS — I2510 Atherosclerotic heart disease of native coronary artery without angina pectoris: Secondary | ICD-10-CM

## 2023-11-25 MED ORDER — ASPIRIN 81 MG PO TBEC
81.0000 mg | DELAYED_RELEASE_TABLET | Freq: Every day | ORAL | Status: AC
Start: 2023-11-25 — End: ?

## 2023-11-25 NOTE — Patient Instructions (Signed)
 Medication Instructions:  START Aspirin 81mg  Take 1 tablet once a day  *If you need a refill on your cardiac medications before your next appointment, please call your pharmacy*   Lab Work: None ordered If you have labs (blood work) drawn today and your tests are completely normal, you will receive your results only by: MyChart Message (if you have MyChart) OR A paper copy in the mail If you have any lab test that is abnormal or we need to change your treatment, we will call you to review the results.   Testing/Procedures: None ordered   Follow-Up: At [facility], you and your health needs are our priority.  As part of our continuing mission to provide you with exceptional heart care, we have created designated Provider Care Teams.  These Care Teams include your primary Cardiologist (physician) and Advanced Practice Providers (APPs -  Physician Assistants and Nurse Practitioners) who all work together to provide you with the care you need, when you need it.  We recommend signing up for the patient portal called "MyChart".  Sign up information is provided on this After Visit Summary.  MyChart is used to connect with patients for Virtual Visits (Telemedicine).  Patients are able to view lab/test results, encounter notes, upcoming appointments, etc.  Non-urgent messages can be sent to your provider as well.   To learn more about what you can do with MyChart, go to [email or web address].    Your next appointment:   12 month(s)  Provider:   Verne Carrow, MD     Other Instructions Heart-Healthy Eating Plan Eating a healthy diet is important for the health of your heart. A heart-healthy eating plan includes: Eating less unhealthy fats. Eating more healthy fats. Eating less salt in your food. Salt is also called sodium. Making other changes in your diet. Talk with your doctor or a diet specialist (dietitian) to create an eating plan that is right for you. What is my  plan? Your doctor may recommend an eating plan that includes: Total fat: ______% or less of total calories a day. Saturated fat: ______% or less of total calories a day. Cholesterol: less than _________mg a day. Sodium: less than _________mg a day. What are tips for following this plan? Cooking Avoid frying your food. Try to bake, boil, grill, or broil it instead. You can also reduce fat by: Removing the skin from poultry. Removing all visible fats from meats. Steaming vegetables in water or broth. Meal planning  At meals, divide your plate into four equal parts: Fill one-half of your plate with vegetables and green salads. Fill one-fourth of your plate with whole grains. Fill one-fourth of your plate with lean protein foods. Eat 2-4 cups of vegetables per day. One cup of vegetables is: 1 cup (91 g) broccoli or cauliflower florets. 2 medium carrots. 1 large bell pepper. 1 large sweet potato. 1 large tomato. 1 medium white potato. 2 cups (150 g) raw leafy greens. Eat 1-2 cups of fruit per day. One cup of fruit is: 1 small apple 1 large banana 1 cup (237 g) mixed fruit, 1 large orange,  cup (82 g) dried fruit, 1 cup (240 mL) 100% fruit juice. Eat more foods that have soluble fiber. These are apples, broccoli, carrots, beans, peas, and barley. Try to get 20-30 g of fiber per day. Eat 4-5 servings of nuts, legumes, and seeds per week: 1 serving of dried beans or legumes equals  cup (90 g) cooked. 1 serving of nuts is  oz (12 almonds, 24 pistachios, or 7 walnut halves). 1 serving of seeds equals  oz (8 g). General information Eat more home-cooked food. Eat less restaurant, buffet, and fast food. Limit or avoid alcohol. Limit foods that are high in starch and sugar. Avoid fried foods. Lose weight if you are overweight. Keep track of how much salt (sodium) you eat. This is important if you have high blood pressure. Ask your doctor to tell you more about this. Try to add  vegetarian meals each week. Fats Choose healthy fats. These include olive oil and canola oil, flaxseeds, walnuts, almonds, and seeds. Eat more omega-3 fats. These include salmon, mackerel, sardines, tuna, flaxseed oil, and ground flaxseeds. Try to eat fish at least 2 times each week. Check food labels. Avoid foods with trans fats or high amounts of saturated fat. Limit saturated fats. These are often found in animal products, such as meats, butter, and cream. These are also found in plant foods, such as palm oil, palm kernel oil, and coconut oil. Avoid foods with partially hydrogenated oils in them. These have trans fats. Examples are stick margarine, some tub margarines, cookies, crackers, and other baked goods. What foods should I eat? Fruits All fresh, canned (in natural juice), or frozen fruits. Vegetables Fresh or frozen vegetables (raw, steamed, roasted, or grilled). Green salads. Grains Most grains. Choose whole wheat and whole grains most of the time. Rice and pasta, including brown rice and pastas made with whole wheat. Meats and other proteins Lean, well-trimmed beef, veal, pork, and lamb. Chicken and [place] without skin. All fish and shellfish. Wild duck, rabbit, pheasant, and venison. Egg whites or low-cholesterol egg substitutes. Dried beans, peas, lentils, and tofu. Seeds and most nuts. Dairy Low-fat or nonfat cheeses, including ricotta and mozzarella. Skim or 1% milk that is liquid, powdered, or evaporated. Buttermilk that is made with low-fat milk. Nonfat or low-fat yogurt. Fats and oils Non-hydrogenated (trans-free) margarines. Vegetable oils, including soybean, sesame, sunflower, olive, peanut, safflower, corn, canola, and cottonseed. Salad dressings or mayonnaise made with a vegetable oil. Beverages Mineral water. Coffee and tea. Diet carbonated beverages. Sweets and desserts Sherbet, gelatin, and fruit ice. Small amounts of dark chocolate. Limit all sweets and  desserts. Seasonings and condiments All seasonings and condiments. The items listed above may not be a complete list of foods and drinks you can eat. Contact a dietitian for more options. What foods should I avoid? Fruits Canned fruit in heavy syrup. Fruit in cream or butter sauce. Fried fruit. Limit coconut. Vegetables Vegetables cooked in cheese, cream, or butter sauce. Fried vegetables. Grains Breads that are made with saturated or trans fats, oils, or whole milk. Croissants. Sweet rolls. Donuts. High-fat crackers, such as cheese crackers. Meats and other proteins Fatty meats, such as hot dogs, ribs, sausage, bacon, rib-eye roast or steak. High-fat deli meats, such as salami and bologna. Caviar. Domestic duck and goose. Organ meats, such as liver. Dairy Cream, sour cream, cream cheese, and creamed cottage cheese. Whole-milk cheeses. Whole or 2% milk that is liquid, evaporated, or condensed. Whole buttermilk. Cream sauce or high-fat cheese sauce. Yogurt that is made from whole milk. Fats and oils Meat fat, or shortening. Cocoa butter, hydrogenated oils, palm oil, coconut oil, palm kernel oil. Solid fats and shortenings, including bacon fat, salt pork, lard, and butter. Nondairy cream substitutes. Salad dressings with cheese or sour cream. Beverages Regular sodas and juice drinks with added sugar. Sweets and desserts Frosting. Pudding. Cookies. Cakes. Pies. Milk chocolate or white  chocolate. Buttered syrups. Full-fat ice cream or ice cream drinks. The items listed above may not be a complete list of foods and drinks to avoid. Contact a dietitian for more information. Summary Heart-healthy meal planning includes eating less unhealthy fats, eating more healthy fats, and making other changes in your diet. Eat a balanced diet. This includes fruits and vegetables, low-fat or nonfat dairy, lean protein, nuts and legumes, whole grains, and heart-healthy oils and fats. This information is not  intended to replace advice given to you by your health care provider. Make sure you discuss any questions you have with your health care provider. Document Revised: 10/05/2021 Document Reviewed: 10/05/2021 Elsevier Patient Education  2024 [employer].

## 2023-12-02 DIAGNOSIS — N951 Menopausal and female climacteric states: Secondary | ICD-10-CM | POA: Diagnosis not present

## 2023-12-02 DIAGNOSIS — R5383 Other fatigue: Secondary | ICD-10-CM | POA: Diagnosis not present

## 2023-12-05 DIAGNOSIS — Z6820 Body mass index (BMI) 20.0-20.9, adult: Secondary | ICD-10-CM | POA: Diagnosis not present

## 2023-12-05 DIAGNOSIS — R5383 Other fatigue: Secondary | ICD-10-CM | POA: Diagnosis not present

## 2023-12-05 DIAGNOSIS — Z79890 Hormone replacement therapy (postmenopausal): Secondary | ICD-10-CM | POA: Diagnosis not present

## 2023-12-05 DIAGNOSIS — N951 Menopausal and female climacteric states: Secondary | ICD-10-CM | POA: Diagnosis not present

## 2023-12-16 DIAGNOSIS — D225 Melanocytic nevi of trunk: Secondary | ICD-10-CM | POA: Diagnosis not present

## 2023-12-16 DIAGNOSIS — L814 Other melanin hyperpigmentation: Secondary | ICD-10-CM | POA: Diagnosis not present

## 2023-12-16 DIAGNOSIS — Z85820 Personal history of malignant melanoma of skin: Secondary | ICD-10-CM | POA: Diagnosis not present

## 2023-12-16 DIAGNOSIS — L821 Other seborrheic keratosis: Secondary | ICD-10-CM | POA: Diagnosis not present

## 2023-12-19 DIAGNOSIS — F4322 Adjustment disorder with anxiety: Secondary | ICD-10-CM | POA: Diagnosis not present

## 2023-12-19 DIAGNOSIS — F5101 Primary insomnia: Secondary | ICD-10-CM | POA: Diagnosis not present

## 2024-01-01 ENCOUNTER — Other Ambulatory Visit: Payer: Self-pay | Admitting: Adult Health

## 2024-01-26 ENCOUNTER — Ambulatory Visit: Payer: Self-pay | Admitting: Emergency Medicine

## 2024-01-26 NOTE — Telephone Encounter (Signed)
 Copied from CRM [id number]. Topic: Clinical - Red Word Triage >> Jan 26, 2024  1:12 PM Jessica Bates wrote: [facility] that prompted transfer to Nurse Triage: Chest pressure, cannot take deep breath today  TRIAGE SUMMARY NOTE: Pt reporting that she has had "mild" chest pressure/heaviness at 3/10 discomfort intermittently for the last week or so that has worsened to be "kind of constant" today lasting longer than 5 min though "not debilitating." Pt also reporting that for the past week she's had increasing sinus pressure especially to left side, though using mucinex, sinus rinses, and sudafed, states that her "sinuses are worse" than chest pressure for her. Pt confirms she is a little more SOB than usual, not getting "good solid breath" as deep as usual but states "not in crisis." Pt reporting she's used her rescue inhaler once today and couple other times during the week. Advised per protocol that pt be examined in hospital, advised could connect to front desk for appt options for exam today or send HP message to office if refusing hospital, pt refusing hospital, requesting HP message to request meds be sent in for pt - please advise. Advised call back if worsening. Pt verbalized understanding. Pt also wants her chart altered to not having penicillin allergy anymore, as she recently took 10 days amoxicillin for dental issue with "no issue whatsoever." Please advise.  E2C2 Pulmonary Triage - Initial Assessment Questions "Chief Complaint (e.g., cough, sob, wheezing, fever, chills, sweat or additional symptoms) *Go to specific symptom protocol after initial questions. Have had the chest pressure mild and intermittent over last week or so Increasing sinus pressure as well especially left side, doing sinus rinses and things, not really helping Feel the movement of the mucus in my face Chest pressure worse today, steady today, more frequent today, more consistent today Chest pressure just kind of constant today, not  debilitating, going about my routine, pressure heaviness, 3/10 discomfort Sinuses are worse than chest pressure for her Not having trouble breathing, not feeling when going upstairs particularly A little more SOB than usual, not getting as deep a breath today, not gasping, not in crisis Antibx and allergies - note in chart that cannot take penicillin since 63 yo but had script for dental issue 10 days amoxicillin and no issue whatsoever and think can take allergy off Chest pressure she's had before, get with pollen and changes in atmosphere They usually call me something in for this  MEDICINES:   "Have you used any OTC meds to help with symptoms?" Yes If yes, ask "What medications?" Sinus rinses, mucinex, sudafed  "Have you used your inhalers/maintenance medication?" Yes If yes, "What medications?" Rescue inhaler used once today and once or twice in last week, symbicort  morning and night  OXYGEN: "Do you wear supplemental oxygen?" No  Reason for Disposition  [1] Chest pain lasts > 5 minutes AND [2] described as crushing, pressure-like, or heavy  Answer Assessment - Initial Assessment Questions 8. PULMONARY RISK FACTORS: "Do you have any history of lung disease?"  (e.g., blood clots in lung, asthma, emphysema, birth control pills)     significant  Protocols used: Chest Pain-A-AH

## 2024-01-26 NOTE — Telephone Encounter (Addendum)
 I have updated allergy list. Dr. Baldwin Levee please advise

## 2024-01-27 ENCOUNTER — Ambulatory Visit: Payer: Self-pay

## 2024-01-27 NOTE — Telephone Encounter (Signed)
 Copied from CRM [id number]. Topic: Clinical - Red Word Triage >> Jan 27, 2024 12:52 PM Justina Oman C wrote: Red Word that prompted transfer to Nurse Triage: Patient [phone] called yesterday complaining of chest pressure, not taking deep breath, and was nurse triage and has not heard back from the office. Patient states not allergic to penicillin. Patient has bronchiectasis and was advised to contac the office for any infection, thinks she has a sinus infection, face pain, shortness of breath. Patient is upset and does not want to repeat same answer as she told the nurse yesterday. Please advise.  Patient calling due to not hearing back from her call yesterday. Patient advised that the conversation had been sent to Dr. Baldwin Levee who will address it when he is able to. Patient verbalized understanding and then ended the call before additional questions could be asked.   Reason for Disposition  Nursing judgment or information in reference  Answer Assessment - Initial Assessment Questions 1. REASON FOR CALL: "What is your main concern right now?"     Follow up to call yesterday  Protocols used: No Guideline Available-A-AH

## 2024-01-30 NOTE — Telephone Encounter (Signed)
 Given the chest tightness, discomfort, I believe she needs to be seen as an acute OV if possible. If no availability then should probably be seen in UC w a CXR. Thank you

## 2024-01-30 NOTE — Telephone Encounter (Signed)
 ATC x1 [id number]

## 2024-01-30 NOTE — Telephone Encounter (Signed)
 Addressed prior message

## 2024-01-30 NOTE — Telephone Encounter (Signed)
 Pt calling for follow- up regarding previous encounter message. Please advise previous encounter Dr. Byrum

## 2024-02-02 NOTE — Telephone Encounter (Signed)
 ATC x2. Sending Mychart message.

## 2024-02-23 ENCOUNTER — Telehealth: Payer: Self-pay | Admitting: Adult Health

## 2024-02-23 NOTE — Telephone Encounter (Signed)
 PT states Zpack is making her sick. Tsf to triage.Tanya Fantasia took the call.

## 2024-02-23 NOTE — Telephone Encounter (Signed)
 Copied from CRM [id number]. Topic: Clinical - Medical Advice >> Feb 22, 2024 12:17 PM Jessica Bates F wrote: Reason for CRM: Pt stated she went to urgent care about a week ago regarding some chest pressure. She completed a ct scan there and was told she may have a possible infection. From there, she was placed on two antibiotics amoxicillin and a zpak. Pt stated about 7 or 8 days taking both she felt like she started to feel worse. Pt completed zpak but wants to know if she is okay to stop taking the amoxicillin. Please call the pt back at [phone] ok to leave a vm.

## 2024-02-23 NOTE — Telephone Encounter (Signed)
 Seen by Baldwin Levee 05/2023. If she is getting worse on abx, needs ov for evaluation . Recommend ov for evaluation  Please contact office for sooner follow up if symptoms do not improve or worsen or seek emergency care

## 2024-02-23 NOTE — Telephone Encounter (Signed)
 Spoke with patient regarding zpack or Amoxicillin making her sick. [place] urgent care for cough ,congestion and blowing brown stuff from her nose and tight feeling in her chest .Urgent care stated she has had Pneumonia  . Patient was given a script for Zpack that patient has finished and still has 4 1/2 days of the Amoxicillin that patient has not finished.Patient is unsure what medication had her not feeling good. Patient also had a CXR. Patient is in [place] today will be back in town later .   Tammy can you please advise

## 2024-02-24 NOTE — Telephone Encounter (Signed)
 Thank you :)

## 2024-02-24 NOTE — Telephone Encounter (Signed)
 I spoke with the pt and got her scheduled for next wk 6/17 at 8:30 with Alston Jerry  Advised to seek emergent care sooner if needed Nothing further needed

## 2024-02-28 ENCOUNTER — Ambulatory Visit (INDEPENDENT_AMBULATORY_CARE_PROVIDER_SITE_OTHER): Admitting: Nurse Practitioner

## 2024-02-28 ENCOUNTER — Encounter: Payer: Self-pay | Admitting: Nurse Practitioner

## 2024-02-28 VITALS — BP 110/70 | HR 61 | Ht 66.0 in | Wt 121.6 lb

## 2024-02-28 DIAGNOSIS — J321 Chronic frontal sinusitis: Secondary | ICD-10-CM | POA: Diagnosis not present

## 2024-02-28 DIAGNOSIS — J479 Bronchiectasis, uncomplicated: Secondary | ICD-10-CM | POA: Diagnosis not present

## 2024-02-28 DIAGNOSIS — J4530 Mild persistent asthma, uncomplicated: Secondary | ICD-10-CM | POA: Diagnosis not present

## 2024-02-28 NOTE — Progress Notes (Signed)
 @Patient  ID: Jessica Bates, female    DOB: 08-06-1961, 63 y.o.   MRN: [id number]  Chief Complaint  Patient presents with   Acute Visit    Chest pressure. Went to urgent care had an x-ray done and was put on antibiotic. She's feels a lot better.    Referring provider: Chrystal Lamarr RAMAN, *  HPI: 63 year old female, former smoker followed for bronchiectasis, mild asthma. She is a patient of Dr. Lanny and last seen in office 05/25/2023. Past medical history significant for chronic sinusitis, GERD, anxiety, allergies.   TEST/EVENTS:  05/09/2023 PFT: FVC 85, FEV1 86, ratio 76, TLC 103, DLCOcor 93 05/17/2023 CT chest: atherosclerosis. Azygous fissure. Tiny left effusion. Medial LLL scarring/atelectatic change. Atelectasis with some btx in lingula. Some mucous plugging, stable. Tree-in-bud like nodular areas in LUL. LLL tree-in-bud areas. Dominant nodule measures 5 mm in this area. Waxing and waning nodularity/opacities. CT review by Dr. Shelah felt improvement in inflammatory changes  05/25/2023: OV with Dr. Shelah. Btx in RML, lingula. Surrounding inflammatory changes resolved; RLL nodule resolved. Feels well overall. Only needs flutter intermittently. Suspect she developed btx in past when being tx for Crohn's with biologics. Mild airflow obstruction on flow volume loop. Raw numbers normal. Okay to continue Symbicort  as she has been benefiting from it.   02/28/2024: Today - acute Patient presents today for intended acute visit. She is now feeling better.  [place] with early pneumonia. A chest x-ray was performed, but the images were not available for review at this visit. The interpretation did advise on some opacities, felt to be related to dense breast tissue but no mention of superimposed infection. She was prescribed augmentin and azithromycin  but had difficulty tolerating both medications simultaneously. She is now  finished with these.  She has a history of bronchiectasis. Her symptoms included chest pressure and tightness with minimal productive cough with purulent phlegm. Not having any significant cough now. Breathing feels back to baseline; no issues. No fevers, chills, hemoptysis, anorexia, weight loss.   She uses Symbicort  as needed, especially during this recent illness, and rarely uses an albuterol  rescue inhaler. She experiences sinus issues, particularly during the spring allergy season. She takes zyrtec, singulair  for allergies.       Allergies  Allergen Reactions   Cymbalta [Duloxetine Hcl]     flushing   Doxycycline  Photosensitivity   Duloxetine     Other Reaction(s): Other (See Comments)  flushing    Pt unsure     Pt unsure   Remicade [Infliximab]     Flushing    Immunization History  Administered Date(s) Administered   Fluzone Influenza virus vaccine,trivalent (IIV3), split virus 06/15/2019   Influenza Split 06/13/2018   Influenza Whole 07/28/2020   Influenza,inj,Quad PF,6+ Mos 06/14/2015, 06/13/2016, 06/15/2019, 06/01/2021, 08/10/2022   Influenza-Unspecified 06/12/2017   Janssen (J&J) SARS-COV-2 Vaccination 11/23/2019   PFIZER(Purple Top)SARS-COV-2 Vaccination 06/15/2020, 07/06/2020, 10/25/2020   Pfizer(Comirnaty)Fall Seasonal Vaccine 12 years and older 06/18/2023   Pneumococcal Conjugate-13 12/31/2016   Pneumococcal Polysaccharide-23 12/31/2016   Tdap 06/19/2008, 06/15/2013    Past Medical History:  Diagnosis Date   Asthma    bronchiectesis   Crohn disease (HCC)    Depression meopause   Dyslipidemia    Fibromyalgia    GERD (gastroesophageal reflux disease)    Hemorrhoids    Hiatal hernia    Melanoma (HCC)    Pulmonary nodules  Tobacco History: Social History   Tobacco Use  Smoking Status Former   Current packs/day: 0.00   Average packs/day: 1 pack/day for 9.0 years (9.0 ttl pk-yrs)   Types: Cigarettes   Start date: 09/13/1989   Quit date:  09/13/1998   Years since quitting: 25.4  Smokeless Tobacco Never   Counseling given: Not Answered   Outpatient Medications Prior to Visit  Medication Sig Dispense Refill   acyclovir (ZOVIRAX) 400 MG tablet Take 400 mg by mouth daily.     albuterol  (VENTOLIN  HFA) 108 (90 Base) MCG/ACT inhaler Inhale 2 puffs into the lungs every 6 (six) hours as needed for wheezing or shortness of breath. 18 g 6   ALPRAZolam (XANAX) 0.25 MG tablet Take 0.125 mg by mouth daily as needed for anxiety.      aspirin  EC 81 MG tablet Take 1 tablet (81 mg total) by mouth daily. Swallow whole.     atorvastatin (LIPITOR) 40 MG tablet Take 40 mg by mouth at bedtime.      budesonide -formoterol  (BREYNA ) 80-4.5 MCG/ACT inhaler INHALE 2 PUFFS BY MOUTH TWICE DAILY 30.9 g 5   cetirizine (ZYRTEC) 10 MG tablet Take 10 mg by mouth daily.     cholecalciferol (VITAMIN D3) 25 MCG (1000 UT) tablet Take 1,000 Units by mouth daily.     estradiol  (VIVELLE -DOT) 0.05 MG/24HR patch Place 1 patch onto the skin 2 (two) times a week.     mesalamine (APRISO) 0.375 g 24 hr capsule Take 750 mg by mouth daily.      methocarbamol (ROBAXIN) 500 MG tablet Take 500 mg by mouth daily as needed (headaches).   1   montelukast  (SINGULAIR ) 10 MG tablet TAKE 1 TABLET(10 MG) BY MOUTH AT BEDTIME 90 tablet 3   omeprazole (PRILOSEC) 20 MG capsule Take 20 mg by mouth daily as needed (acid reflux).     Respiratory Therapy Supplies (FLUTTER) DEVI Use flutter valve 3 times a day 1 each 0   Zinc 30 MG CAPS Take 30 mg by mouth daily as needed.     zolpidem  (AMBIEN ) 10 MG tablet Take 10 mg by mouth at bedtime.     azithromycin  (ZITHROMAX ) 250 MG tablet Take as directed 6 tablet 0   ondansetron  (ZOFRAN ) 4 MG tablet Take 1 tablet (4 mg total) by mouth every 8 (eight) hours as needed for nausea or vomiting. (Patient not taking: Reported on 02/28/2024) 20 tablet 0   levofloxacin  (LEVAQUIN ) 500 MG tablet Take 1 tablet (500 mg total) by mouth daily. (Patient not taking:  Reported on 02/28/2024) 7 tablet 0   predniSONE  (DELTASONE ) 10 MG tablet 2 tablets x 5 days (Patient not taking: Reported on 02/28/2024) 10 tablet 0   No facility-administered medications prior to visit.     Review of Systems:   Constitutional: No weight loss or gain, night sweats, fevers, chills, fatigue, or lassitude. HEENT: No headaches, difficulty swallowing, tooth/dental problems, or sore throat. No sneezing, itching, ear ache +improving nasal congestion  CV:  No chest pain, orthopnea, PND, swelling in lower extremities, anasarca, dizziness, palpitations, syncope Resp: +cough (resolving). No shortness of breath with exertion or at rest. No excess mucus or change in color of mucus. No productive or non-productive. No hemoptysis. No wheezing.   GI:  No heartburn, indigestion Skin: No rash, lesions, ulcerations MSK:  No joint pain or swelling.   Neuro: No dizziness or lightheadedness.  Psych: No depression or anxiety. Mood stable.     Physical Exam:  BP 110/70 (BP Location:  Left Arm, Patient Position: Sitting, Cuff Size: Normal)   Pulse 61   Ht 5' 6 (1.676 m)   Wt 121 lb 9.6 oz (55.2 kg)   LMP 02/14/2014 Comment: bcp//A.C.  SpO2 97%   BMI 19.63 kg/m   GEN: Pleasant, interactive, well-appearing; in no acute distress HEENT:  Normocephalic and atraumatic. PERRLA. Sclera white. Nasal turbinates pink, moist and patent bilaterally. No rhinorrhea present. Oropharynx pink and moist, without exudate or edema. No lesions, ulcerations, or postnasal drip.  NECK:  Supple w/ fair ROM. No JVD present. Thyroid  symmetrical with no goiter or nodules palpated. No lymphadenopathy.   CV: RRR, no m/r/g, no peripheral edema. Pulses intact, +2 bilaterally. No cyanosis, pallor or clubbing. PULMONARY:  Unlabored, regular breathing. Clear bilaterally A&P w/o wheezes/rales/rhonchi. No accessory muscle use.  GI: BS present and normoactive. Soft, non-tender to palpation. No organomegaly or masses detected.   MSK: No erythema, warmth or tenderness. Cap refil <2 sec all extrem.  Neuro: A/Ox3. No focal deficits noted.   Skin: Warm, no lesions or rashe Psych: Normal affect and behavior. Judgement and thought content appropriate.     Lab Results:  CBC    Component Value Date/Time   WBC 11.6 (H) 08/28/2019 0524   RBC 3.35 (L) 08/28/2019 0524   HGB 10.7 (L) 08/28/2019 0524   HCT 34.5 (L) 08/28/2019 0524   PLT 257 08/28/2019 0524   MCV 103.0 (H) 08/28/2019 0524   MCH 31.9 08/28/2019 0524   MCHC 31.0 08/28/2019 0524   RDW 11.7 08/28/2019 0524   LYMPHSABS 2.5 11/07/2016 1157   MONOABS 0.9 11/07/2016 1157   EOSABS 0.0 11/07/2016 1157   BASOSABS 0.0 11/07/2016 1157    BMET    Component Value Date/Time   NA 139 08/23/2019 1345   K 4.0 08/23/2019 1345   CL 105 08/23/2019 1345   CO2 27 08/23/2019 1345   GLUCOSE 114 (H) 08/23/2019 1345   BUN 12 08/23/2019 1345   CREATININE 0.55 08/23/2019 1345   CALCIUM 9.0 08/23/2019 1345   GFRNONAA >60 08/23/2019 1345   GFRAA >60 08/23/2019 1345    BNP No results found for: BNP   Imaging:  No results found.  Administration History     None          Latest Ref Rng & Units 05/09/2023    4:10 PM 06/14/2017    4:03 PM  PFT Results  FVC-Pre L 2.90  3.15   FVC-Predicted Pre % 85  88   FVC-Post L 3.10  3.14   FVC-Predicted Post % 91  88   Pre FEV1/FVC % % 78  72   Post FEV1/FCV % % 76  77   FEV1-Pre L 2.26  2.27   FEV1-Predicted Pre % 86  82   FEV1-Post L 2.36  2.43   DLCO uncorrected ml/min/mmHg 19.46  16.68   DLCO UNC% % 93  65   DLCO corrected ml/min/mmHg 19.46  16.11   DLCO COR %Predicted % 93  62   DLVA Predicted % 94  73   TLC L 5.38  5.02   TLC % Predicted % 103  96   RV % Predicted % 101  103     Lab Results  Component Value Date   NITRICOXIDE 18 06/13/2017        Assessment & Plan:   Bronchiectasis without complication (HCC) Recent exacerbation. UC note reviewed. Unclear if she truly had pneumonia based  on CXR interpretation. She had difficulties tolerating abx; suspect it  was the augmentin. No allergy but difficulties with intolerance. Consider cephalosporin in the future. Reviewed today. Clinically improved now. Last flare prior to this was when she had COVID 08/2023. No hospitalizations. Continue mucociliary clearance therapies. Repeat CXR in 5 weeks. Will plan next CT chest timing based on this. Action plan in place.  Patient Instructions  Continue Albuterol  inhaler 2 puffs every 6 hours as needed for shortness of breath or wheezing. Notify if symptoms persist despite rescue inhaler/neb use.  Continue Symbicort  2 puffs Twice daily. Brush tongue and rinse mouth afterwards Continue cetirizine 1 tab daily for allergies Continue montelukast  1 tab daily  Guaifenesin 600 mg Twice daily for cough/congestion   Repeat chest x ray in 5 weeks  Follow up in 5 weeks with Dr. Shelah, Madelin, or Katie. If symptoms do not improve or worsen, please contact office for sooner follow up or seek emergency care.    Asthma See above. Clear lung exam. Continue current regimen. Action plan in place.   Sinusitis chronic, frontal Stable today. Recent acute on chronic sinus symptoms. Improved. Continue allergy/sinus regimen    Advised if symptoms do not improve or worsen, to please contact office for sooner follow up or seek emergency care.   I spent 35 minutes of dedicated to the care of this patient on the date of this encounter to include pre-visit review of records, face-to-face time with the patient discussing conditions above, post visit ordering of testing, clinical documentation with the electronic health record, making appropriate referrals as documented, and communicating necessary findings to members of the patients care team.  Comer LULLA Rouleau, NP 03/01/2024  Pt aware and understands NP's role.

## 2024-02-28 NOTE — Patient Instructions (Signed)
 Continue Albuterol  inhaler 2 puffs every 6 hours as needed for shortness of breath or wheezing. Notify if symptoms persist despite rescue inhaler/neb use.  Continue Symbicort  2 puffs Twice daily. Brush tongue and rinse mouth afterwards Continue cetirizine 1 tab daily for allergies Continue montelukast  1 tab daily  Guaifenesin 600 mg Twice daily for cough/congestion   Repeat chest x ray in 5 weeks  Follow up in 5 weeks with Dr. Baldwin Levee, Benn Brash, or Katie. If symptoms do not improve or worsen, please contact office for sooner follow up or seek emergency care.

## 2024-03-01 ENCOUNTER — Encounter: Payer: Self-pay | Admitting: Nurse Practitioner

## 2024-03-01 NOTE — Assessment & Plan Note (Signed)
 See above. Clear lung exam. Continue current regimen. Action plan in place.

## 2024-03-01 NOTE — Assessment & Plan Note (Signed)
 Recent exacerbation. UC note reviewed. Unclear if she truly had pneumonia based on CXR interpretation. She had difficulties tolerating abx; suspect it was the augmentin. No allergy but difficulties with intolerance. Consider cephalosporin in the future. Reviewed today. Clinically improved now. Last flare prior to this was when she had COVID 08/2023. No hospitalizations. Continue mucociliary clearance therapies. Repeat CXR in 5 weeks. Will plan next CT chest timing based on this. Action plan in place.  Patient Instructions  Continue Albuterol  inhaler 2 puffs every 6 hours as needed for shortness of breath or wheezing. Notify if symptoms persist despite rescue inhaler/neb use.  Continue Symbicort  2 puffs Twice daily. Brush tongue and rinse mouth afterwards Continue cetirizine 1 tab daily for allergies Continue montelukast  1 tab daily  Guaifenesin 600 mg Twice daily for cough/congestion   Repeat chest x ray in 5 weeks  Follow up in 5 weeks with Dr. Baldwin Levee, Benn Brash, or Katie. If symptoms do not improve or worsen, please contact office for sooner follow up or seek emergency care.

## 2024-03-01 NOTE — Assessment & Plan Note (Signed)
 Stable today. Recent acute on chronic sinus symptoms. Improved. Continue allergy/sinus regimen

## 2024-04-19 ENCOUNTER — Encounter (HOSPITAL_BASED_OUTPATIENT_CLINIC_OR_DEPARTMENT_OTHER): Payer: Self-pay | Admitting: Nurse Practitioner

## 2024-04-19 ENCOUNTER — Ambulatory Visit (HOSPITAL_BASED_OUTPATIENT_CLINIC_OR_DEPARTMENT_OTHER): Admitting: Nurse Practitioner

## 2024-04-19 ENCOUNTER — Ambulatory Visit (HOSPITAL_BASED_OUTPATIENT_CLINIC_OR_DEPARTMENT_OTHER)

## 2024-04-19 VITALS — BP 138/82 | HR 64 | Ht 66.0 in | Wt 120.8 lb

## 2024-04-19 DIAGNOSIS — J321 Chronic frontal sinusitis: Secondary | ICD-10-CM

## 2024-04-19 DIAGNOSIS — R030 Elevated blood-pressure reading, without diagnosis of hypertension: Secondary | ICD-10-CM | POA: Insufficient documentation

## 2024-04-19 DIAGNOSIS — J479 Bronchiectasis, uncomplicated: Secondary | ICD-10-CM

## 2024-04-19 DIAGNOSIS — J4530 Mild persistent asthma, uncomplicated: Secondary | ICD-10-CM | POA: Diagnosis not present

## 2024-04-19 NOTE — Assessment & Plan Note (Signed)
 SBP >140. Question if headaches associated. She is having a time of higher stress. Advised her to keep a log at home and notify PCP/cardiology. Red flag symptoms.

## 2024-04-19 NOTE — Progress Notes (Signed)
 @Patient  ID: Jessica Bates, female    DOB: 03/15/1961, 63 y.o.   MRN: [id number]  Chief Complaint  Patient presents with   Follow-up    Bronchiectasis    Referring provider: Chrystal Lamarr RAMAN, *  HPI: 63 year old female, former smoker followed for bronchiectasis, mild asthma. She is a patient of Dr. Lanny and last seen in office 02/28/2024 by [facility] NP. Past medical history significant for chronic sinusitis, GERD, anxiety, allergies.   TEST/EVENTS:  05/09/2023 PFT: FVC 85, FEV1 86, ratio 76, TLC 103, DLCOcor 93 05/17/2023 CT chest: atherosclerosis. Azygous fissure. Tiny left effusion. Medial LLL scarring/atelectatic change. Atelectasis with some btx in lingula. Some mucous plugging, stable. Tree-in-bud like nodular areas in LUL. LLL tree-in-bud areas. Dominant nodule measures 5 mm in this area. Waxing and waning nodularity/opacities. CT review by Dr. Shelah felt improvement in inflammatory changes  05/25/2023: OV with Dr. Shelah. Btx in RML, lingula. Surrounding inflammatory changes resolved; RLL nodule resolved. Feels well overall. Only needs flutter intermittently. Suspect she developed btx in past when being tx for Crohn's with biologics. Mild airflow obstruction on flow volume loop. Raw numbers normal. Okay to continue Symbicort  as she has been benefiting from it.   02/28/2024: SHERLEAN with Henryk Ursin  NP Patient presents today for intended acute visit. She is now feeling better. [place] with early pneumonia. A chest x-ray was performed, but the images were not available for review at this visit. The interpretation did advise on some opacities, felt to be related to dense breast tissue but no mention of superimposed infection. She was prescribed augmentin and azithromycin  but had difficulty tolerating both medications simultaneously. She is now finished with these. She has a history of bronchiectasis. Her symptoms included  chest pressure and tightness with minimal productive cough with purulent phlegm. Not having any significant cough now. Breathing feels back to baseline; no issues. No fevers, chills, hemoptysis, anorexia, weight loss.  She uses Symbicort  as needed, especially during this recent illness, and rarely uses an albuterol  rescue inhaler. She experiences sinus issues, particularly during the spring allergy season. She takes zyrtec, singulair  for allergies.     04/19/2024: Today - follow up Discussed the use of AI scribe software for clinical note transcription with the patient, who gave verbal consent to proceed.  History of Present Illness YVONNIE SCHINKE is a 63 year old female who presents for follow up.  She has noticed elevated blood pressure readings, with a recent measurement of 149/75 mmHg. She discovered this while monitoring her mother's blood pressure at home. She is not on any antihypertensive medication and associates the elevated readings with stress. She has been having intermittent headaches. No vision changes, weakness, speech changes, dizziness/lightheadedness. Previous readings were normal, including 110/70 mmHg during her last visit and prior to that, 130/62 mmHg at her cardiologist's office.  She continues to use Mucinex and Symbicort  for respiratory symptoms. She used her albuterol  inhaler yesterday after experiencing pressure and tightness in her airways. This has since resolved. She experiences a tiny dry cough occasionally but denies hemoptysis, fevers, or productive cough. Her breathing is generally good aside from the recent episode of tightness. Overall, feeling well from a respiratory standpoint. She was treated for potential pneumonia at [facility] early June and is planned to have repeat chest x ray today.   Her energy levels and appetite remain stable.   Some occasional nasal congestion related to weather.  Allergies  Allergen Reactions   Cymbalta [Duloxetine Hcl]     flushing    Doxycycline  Photosensitivity   Duloxetine     Other Reaction(s): Other (See Comments)  flushing    Pt unsure     Pt unsure   Remicade [Infliximab]     Flushing    Immunization History  Administered Date(s) Administered   Fluzone Influenza virus vaccine,trivalent (IIV3), split virus 06/15/2019   Influenza Split 06/13/2018   Influenza Whole 07/28/2020   Influenza,inj,Quad PF,6+ Mos 06/14/2015, 06/13/2016, 06/15/2019, 06/01/2021, 08/10/2022   Influenza-Unspecified 06/12/2017   Janssen (J&J) SARS-COV-2 Vaccination 11/23/2019   PFIZER(Purple Top)SARS-COV-2 Vaccination 06/15/2020, 07/06/2020, 10/25/2020   Pfizer(Comirnaty)Fall Seasonal Vaccine 12 years and older 06/18/2023   Pneumococcal Conjugate-13 12/31/2016   Pneumococcal Polysaccharide-23 12/31/2016   Tdap 06/19/2008, 06/15/2013    Past Medical History:  Diagnosis Date   Asthma    bronchiectesis   Crohn disease (HCC)    Depression meopause   Dyslipidemia    Fibromyalgia    GERD (gastroesophageal reflux disease)    Hemorrhoids    Hiatal hernia    Melanoma (HCC)    Pulmonary nodules     Tobacco History: Social History   Tobacco Use  Smoking Status Former   Current packs/day: 0.00   Average packs/day: 1 pack/day for 9.0 years (9.0 ttl pk-yrs)   Types: Cigarettes   Start date: 09/13/1989   Quit date: 09/13/1998   Years since quitting: 25.6  Smokeless Tobacco Never   Counseling given: Not Answered   Outpatient Medications Prior to Visit  Medication Sig Dispense Refill   acyclovir (ZOVIRAX) 400 MG tablet Take 400 mg by mouth daily.     albuterol  (VENTOLIN  HFA) 108 (90 Base) MCG/ACT inhaler Inhale 2 puffs into the lungs every 6 (six) hours as needed for wheezing or shortness of breath. 18 g 6   ALPRAZolam (XANAX) 0.25 MG tablet Take 0.125 mg by mouth daily as needed for anxiety.      aspirin  EC 81 MG tablet Take 1 tablet (81 mg total) by mouth daily. Swallow whole.     atorvastatin (LIPITOR) 40 MG tablet Take  40 mg by mouth at bedtime.      budesonide -formoterol  (BREYNA ) 80-4.5 MCG/ACT inhaler INHALE 2 PUFFS BY MOUTH TWICE DAILY 30.9 g 5   cetirizine (ZYRTEC) 10 MG tablet Take 10 mg by mouth daily.     cholecalciferol (VITAMIN D3) 25 MCG (1000 UT) tablet Take 1,000 Units by mouth daily.     estradiol  (VIVELLE -DOT) 0.05 MG/24HR patch Place 1 patch onto the skin 2 (two) times a week.     mesalamine (APRISO) 0.375 g 24 hr capsule Take 750 mg by mouth daily.      methocarbamol (ROBAXIN) 500 MG tablet Take 500 mg by mouth daily as needed (headaches).   1   montelukast  (SINGULAIR ) 10 MG tablet TAKE 1 TABLET(10 MG) BY MOUTH AT BEDTIME 90 tablet 3   omeprazole (PRILOSEC) 20 MG capsule Take 20 mg by mouth daily as needed (acid reflux).     ondansetron  (ZOFRAN ) 4 MG tablet Take 1 tablet (4 mg total) by mouth every 8 (eight) hours as needed for nausea or vomiting. 20 tablet 0   Respiratory Therapy Supplies (FLUTTER) DEVI Use flutter valve 3 times a day 1 each 0   Zinc 30 MG CAPS Take 30 mg by mouth daily as needed.     zolpidem  (AMBIEN ) 10 MG tablet Take 10 mg by mouth at bedtime.     No facility-administered medications  prior to visit.     Review of Systems:   Constitutional: No weight loss or gain, night sweats, fevers, chills, fatigue, or lassitude. HEENT: No difficulty swallowing, tooth/dental problems, or sore throat. No sneezing, itching, ear ache +occasional nasal congestion, intermittent headaches  CV:  No chest pain, orthopnea, PND, swelling in lower extremities, anasarca, dizziness, palpitations, syncope Resp: +cough (minimal). No shortness of breath with exertion or at rest. No excess mucus or change in color of mucus. No productive or non-productive. No hemoptysis. No wheezing.   GI:  No heartburn, indigestion Skin: No rash, lesions, ulcerations MSK:  No joint pain or swelling.   Neuro: No dizziness or lightheadedness.  Psych: No depression or anxiety. Mood stable.     Physical  Exam:  BP 138/82   Pulse 64   Ht 5' 6 (1.676 m)   Wt 120 lb 12.8 oz (54.8 kg)   LMP 02/14/2014 Comment: bcp//A.C.  SpO2 98%   BMI 19.50 kg/m   GEN: Pleasant, interactive, well-appearing; in no acute distress HEENT:  Normocephalic and atraumatic. PERRLA. Sclera white. Nasal turbinates pink, moist and patent bilaterally. No rhinorrhea present. Oropharynx pink and moist, without exudate or edema. No lesions, ulcerations, or postnasal drip.  NECK:  Supple w/ fair ROM. No JVD present. No lymphadenopathy.   CV: RRR, no m/r/g, no peripheral edema. Pulses intact, +2 bilaterally. No cyanosis, pallor or clubbing. PULMONARY:  Unlabored, regular breathing. Clear bilaterally A&P w/o wheezes/rales/rhonchi. No accessory muscle use.  GI: BS present and normoactive. Soft, non-tender to palpation. No organomegaly or masses detected.  MSK: No erythema, warmth or tenderness. Cap refil <2 sec all extrem.  Neuro: A/Ox3. No focal deficits noted.   Skin: Warm, no lesions or rashe Psych: Normal affect and behavior. Judgement and thought content appropriate.     Lab Results:  CBC    Component Value Date/Time   WBC 11.6 (H) 08/28/2019 0524   RBC 3.35 (L) 08/28/2019 0524   HGB 10.7 (L) 08/28/2019 0524   HCT 34.5 (L) 08/28/2019 0524   PLT 257 08/28/2019 0524   MCV 103.0 (H) 08/28/2019 0524   MCH 31.9 08/28/2019 0524   MCHC 31.0 08/28/2019 0524   RDW 11.7 08/28/2019 0524   LYMPHSABS 2.5 11/07/2016 1157   MONOABS 0.9 11/07/2016 1157   EOSABS 0.0 11/07/2016 1157   BASOSABS 0.0 11/07/2016 1157    BMET    Component Value Date/Time   NA 139 08/23/2019 1345   K 4.0 08/23/2019 1345   CL 105 08/23/2019 1345   CO2 27 08/23/2019 1345   GLUCOSE 114 (H) 08/23/2019 1345   BUN 12 08/23/2019 1345   CREATININE 0.55 08/23/2019 1345   CALCIUM 9.0 08/23/2019 1345   GFRNONAA >60 08/23/2019 1345   GFRAA >60 08/23/2019 1345    BNP No results found for: BNP   Imaging:  No results  found.  Administration History     None          Latest Ref Rng & Units 05/09/2023    4:10 PM 06/14/2017    4:03 PM  PFT Results  FVC-Pre L 2.90  3.15   FVC-Predicted Pre % 85  88   FVC-Post L 3.10  3.14   FVC-Predicted Post % 91  88   Pre FEV1/FVC % % 78  72   Post FEV1/FCV % % 76  77   FEV1-Pre L 2.26  2.27   FEV1-Predicted Pre % 86  82   FEV1-Post L 2.36  2.43   DLCO  uncorrected ml/min/mmHg 19.46  16.68   DLCO UNC% % 93  65   DLCO corrected ml/min/mmHg 19.46  16.11   DLCO COR %Predicted % 93  62   DLVA Predicted % 94  73   TLC L 5.38  5.02   TLC % Predicted % 103  96   RV % Predicted % 101  103     Lab Results  Component Value Date   NITRICOXIDE 18 06/13/2017        Assessment & Plan:   Bronchiectasis without complication (HCC) Clinically stable/improved. Exacerbation 02/2024. Diagnosed with pneumonia by UC; however, unclear if she truly had pneumonia based on CXR interpretation. She had difficulties tolerating augmentin at the time. Consider cephalosporin in the future. Last flare prior to this was when she had COVID 08/2023. No hospitalizations. Continue mucociliary clearance therapies. Repeat CXR today. Will plan next CT chest timing based on this. Action plan in place.  Patient Instructions  Continue Albuterol  inhaler 2 puffs every 6 hours as needed for shortness of breath or wheezing. Notify if symptoms persist despite rescue inhaler/neb use.  Continue Symbicort  2 puffs Twice daily. Brush tongue and rinse mouth afterwards Continue cetirizine 1 tab daily for allergies Continue montelukast  1 tab daily  Continue guaifenesin 600 mg Twice daily as needed for cough/congestion    Chest x ray today   Check your blood pressure at home and keep a log. If it's staying above 140/90, let your heart doctor or primary care provider know   Follow up in 6 months with Dr. Shelah, Madelin, or Katie. If symptoms do not improve or worsen, please contact office for sooner follow  up or seek emergency care.    Asthma See above. Clear lung exam. Continue current regimen. Action plan in place. Use caution with ICS in setting of btx  Elevated blood pressure reading SBP >140. Question if headaches associated. She is having a time of higher stress. Advised her to keep a log at home and notify PCP/cardiology. Red flag symptoms.   Sinusitis chronic, frontal Intermittent related to weather. Stable today. Utilize flonase PRN    Advised if symptoms do not improve or worsen, to please contact office for sooner follow up or seek emergency care.   I spent 35 minutes of dedicated to the care of this patient on the date of this encounter to include pre-visit review of records, face-to-face time with the patient discussing conditions above, post visit ordering of testing, clinical documentation with the electronic health record, making appropriate referrals as documented, and communicating necessary findings to members of the patients care team.  Comer LULLA Rouleau, NP 04/19/2024  Pt aware and understands NP's role.

## 2024-04-19 NOTE — Assessment & Plan Note (Addendum)
 See above. Clear lung exam. Continue current regimen. Action plan in place. Use caution with ICS in setting of btx

## 2024-04-19 NOTE — Assessment & Plan Note (Addendum)
 Clinically stable/improved. Exacerbation 02/2024. Diagnosed with pneumonia by UC; however, unclear if she truly had pneumonia based on CXR interpretation. She had difficulties tolerating augmentin at the time. Consider cephalosporin in the future. Last flare prior to this was when she had COVID 08/2023. No hospitalizations. Continue mucociliary clearance therapies. Repeat CXR today. Will plan next CT chest timing based on this. Action plan in place.  Patient Instructions  Continue Albuterol  inhaler 2 puffs every 6 hours as needed for shortness of breath or wheezing. Notify if symptoms persist despite rescue inhaler/neb use.  Continue Symbicort  2 puffs Twice daily. Brush tongue and rinse mouth afterwards Continue cetirizine 1 tab daily for allergies Continue montelukast  1 tab daily  Continue guaifenesin 600 mg Twice daily as needed for cough/congestion    Chest x ray today   Check your blood pressure at home and keep a log. If it's staying above 140/90, let your heart doctor or primary care provider know   Follow up in 6 months with Dr. Shelah, Madelin, or Katie. If symptoms do not improve or worsen, please contact office for sooner follow up or seek emergency care.

## 2024-04-19 NOTE — Assessment & Plan Note (Signed)
 Intermittent related to weather. Stable today. Utilize flonase PRN

## 2024-04-19 NOTE — Patient Instructions (Addendum)
 Continue Albuterol  inhaler 2 puffs every 6 hours as needed for shortness of breath or wheezing. Notify if symptoms persist despite rescue inhaler/neb use.  Continue Symbicort  2 puffs Twice daily. Brush tongue and rinse mouth afterwards Continue cetirizine 1 tab daily for allergies Continue montelukast  1 tab daily  Continue guaifenesin 600 mg Twice daily as needed for cough/congestion    Chest x ray today   Check your blood pressure at home and keep a log. If it's staying above 140/90, let your heart doctor or primary care provider know   Follow up in 6 months with Dr. Shelah, Madelin, or Katie. If symptoms do not improve or worsen, please contact office for sooner follow up or seek emergency care.

## 2024-04-26 ENCOUNTER — Ambulatory Visit: Payer: Self-pay | Admitting: Nurse Practitioner

## 2024-04-26 NOTE — Progress Notes (Signed)
 Lungs are clear on CXR. The radiologist had mentioned that her btx was too subtle to be well evaluated on CXR and recommend CT chest. I think we can hold for now given her symptoms are improved and there's nothing concerning on CXR. We already know she has underlying btx and are monitoring it. Thanks!

## 2024-04-26 NOTE — Telephone Encounter (Signed)
**Note De-identified  [place] Obfuscation** Please advise 

## 2024-04-26 NOTE — Telephone Encounter (Signed)
 Would repeat CT chest wo contrast in 5 months, prior to her next follow up. Thanks.

## 2024-07-10 ENCOUNTER — Other Ambulatory Visit (HOSPITAL_BASED_OUTPATIENT_CLINIC_OR_DEPARTMENT_OTHER): Payer: Self-pay

## 2024-07-10 DIAGNOSIS — J479 Bronchiectasis, uncomplicated: Secondary | ICD-10-CM

## 2024-07-10 DIAGNOSIS — R911 Solitary pulmonary nodule: Secondary | ICD-10-CM

## 2024-08-23 ENCOUNTER — Other Ambulatory Visit (HOSPITAL_BASED_OUTPATIENT_CLINIC_OR_DEPARTMENT_OTHER): Payer: Self-pay | Admitting: *Deleted

## 2024-08-23 DIAGNOSIS — R011 Cardiac murmur, unspecified: Secondary | ICD-10-CM

## 2024-08-23 NOTE — Progress Notes (Signed)
 Order for echo placed per Dr. Verlin due Jan 2026.

## 2024-09-24 ENCOUNTER — Ambulatory Visit (HOSPITAL_BASED_OUTPATIENT_CLINIC_OR_DEPARTMENT_OTHER): Payer: Self-pay

## 2024-10-08 ENCOUNTER — Ambulatory Visit (HOSPITAL_COMMUNITY): Payer: Self-pay

## 2024-11-07 ENCOUNTER — Ambulatory Visit (HOSPITAL_COMMUNITY): Payer: Self-pay
